# Patient Record
Sex: Female | Born: 1970 | Race: White | Hispanic: No | Smoking: Never smoker
Health system: Southern US, Community
[De-identification: ages and names within clinical notes are randomized; demographics above are authoritative.]

## PROBLEM LIST (undated history)

## (undated) DIAGNOSIS — B1920 Unspecified viral hepatitis C without hepatic coma: Secondary | ICD-10-CM

## (undated) DIAGNOSIS — D649 Anemia, unspecified: Secondary | ICD-10-CM

## (undated) DIAGNOSIS — J449 Chronic obstructive pulmonary disease, unspecified: Secondary | ICD-10-CM

## (undated) DIAGNOSIS — F1290 Cannabis use, unspecified, uncomplicated: Secondary | ICD-10-CM

## (undated) DIAGNOSIS — K219 Gastro-esophageal reflux disease without esophagitis: Secondary | ICD-10-CM

## (undated) DIAGNOSIS — A419 Sepsis, unspecified organism: Secondary | ICD-10-CM

## (undated) DIAGNOSIS — E785 Hyperlipidemia, unspecified: Secondary | ICD-10-CM

## (undated) DIAGNOSIS — Z87442 Personal history of urinary calculi: Secondary | ICD-10-CM

## (undated) DIAGNOSIS — R519 Headache, unspecified: Secondary | ICD-10-CM

## (undated) DIAGNOSIS — E039 Hypothyroidism, unspecified: Secondary | ICD-10-CM

## (undated) DIAGNOSIS — Z720 Tobacco use: Secondary | ICD-10-CM

## (undated) DIAGNOSIS — G8929 Other chronic pain: Secondary | ICD-10-CM

## (undated) DIAGNOSIS — I10 Essential (primary) hypertension: Secondary | ICD-10-CM

## (undated) DIAGNOSIS — N9489 Other specified conditions associated with female genital organs and menstrual cycle: Secondary | ICD-10-CM

## (undated) DIAGNOSIS — E669 Obesity, unspecified: Secondary | ICD-10-CM

## (undated) DIAGNOSIS — F419 Anxiety disorder, unspecified: Secondary | ICD-10-CM

## (undated) HISTORY — PX: LUNG SURGERY: SHX703

## (undated) HISTORY — DX: Hyperlipidemia, unspecified: E78.5

## (undated) HISTORY — PX: CLAVICLE SURGERY: SHX598

## (undated) HISTORY — DX: Obesity, unspecified: E66.9

## (undated) HISTORY — DX: Essential (primary) hypertension: I10

## (undated) HISTORY — PX: MANDIBLE SURGERY: SHX707

## (undated) HISTORY — DX: Hypothyroidism, unspecified: E03.9

## (undated) HISTORY — PX: TRACHEOSTOMY: SUR1362

## (undated) HISTORY — DX: Gastro-esophageal reflux disease without esophagitis: K21.9

## (undated) HISTORY — DX: Chronic obstructive pulmonary disease, unspecified: J44.9

## (undated) HISTORY — DX: Anxiety disorder, unspecified: F41.9

---
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
## 2000-08-04 ENCOUNTER — Emergency Department: Admit: 2000-08-04 | Payer: Self-pay | Source: Emergency Department

## 2004-01-17 ENCOUNTER — Emergency Department (HOSPITAL_COMMUNITY): Admission: EM | Admit: 2004-01-17 | Discharge: 2004-01-17 | Payer: Self-pay | Admitting: Emergency Medicine

## 2006-06-25 ENCOUNTER — Emergency Department (HOSPITAL_COMMUNITY): Admission: EM | Admit: 2006-06-25 | Discharge: 2006-06-25 | Payer: Self-pay | Admitting: Emergency Medicine

## 2007-01-08 ENCOUNTER — Emergency Department (HOSPITAL_COMMUNITY): Admission: EM | Admit: 2007-01-08 | Discharge: 2007-01-08 | Payer: Self-pay | Admitting: Emergency Medicine

## 2007-01-09 ENCOUNTER — Emergency Department (HOSPITAL_COMMUNITY): Admission: EM | Admit: 2007-01-09 | Discharge: 2007-01-09 | Payer: Self-pay | Admitting: Emergency Medicine

## 2007-11-11 ENCOUNTER — Emergency Department: Payer: Self-pay | Admitting: Internal Medicine

## 2009-05-29 ENCOUNTER — Emergency Department: Payer: Self-pay

## 2011-03-08 ENCOUNTER — Encounter (INDEPENDENT_AMBULATORY_CARE_PROVIDER_SITE_OTHER): Payer: Medicaid Other | Admitting: Obstetrics & Gynecology

## 2011-03-08 ENCOUNTER — Other Ambulatory Visit (HOSPITAL_COMMUNITY)
Admission: RE | Admit: 2011-03-08 | Discharge: 2011-03-08 | Disposition: A | Discharge status: Home / Self Care | Payer: Medicaid Other | Source: Ambulatory Visit | Attending: Obstetrics & Gynecology | Admitting: Obstetrics & Gynecology

## 2011-03-08 DIAGNOSIS — Z01419 Encounter for gynecological examination (general) (routine) without abnormal findings: Secondary | ICD-10-CM

## 2011-03-08 DIAGNOSIS — Z87891 Personal history of nicotine dependence: Secondary | ICD-10-CM

## 2011-03-08 DIAGNOSIS — N949 Unspecified condition associated with female genital organs and menstrual cycle: Secondary | ICD-10-CM
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
## 2011-03-16 ENCOUNTER — Other Ambulatory Visit: Payer: Self-pay | Admitting: Obstetrics & Gynecology

## 2011-03-16 DIAGNOSIS — Z1231 Encounter for screening mammogram for malignant neoplasm of breast: Secondary | ICD-10-CM

## 2011-03-16 DIAGNOSIS — R102 Pelvic and perineal pain: Secondary | ICD-10-CM

## 2011-03-22 ENCOUNTER — Ambulatory Visit: Payer: Self-pay | Admitting: Family Medicine

## 2011-03-23 ENCOUNTER — Ambulatory Visit (HOSPITAL_COMMUNITY): Payer: Medicaid Other

## 2011-03-25 ENCOUNTER — Ambulatory Visit (HOSPITAL_COMMUNITY): Payer: Medicaid Other

## 2011-03-28 ENCOUNTER — Other Ambulatory Visit: Payer: Self-pay | Admitting: Obstetrics & Gynecology

## 2011-03-28 ENCOUNTER — Ambulatory Visit (HOSPITAL_COMMUNITY)
Admission: RE | Admit: 2011-03-28 | Discharge: 2011-03-28 | Disposition: A | Discharge status: Home / Self Care | Payer: Medicaid Other | Source: Ambulatory Visit | Attending: Obstetrics & Gynecology | Admitting: Obstetrics & Gynecology

## 2011-03-28 DIAGNOSIS — N83209 Unspecified ovarian cyst, unspecified side: Secondary | ICD-10-CM | POA: Insufficient documentation

## 2011-03-28 DIAGNOSIS — N949 Unspecified condition associated with female genital organs and menstrual cycle: Secondary | ICD-10-CM | POA: Insufficient documentation

## 2011-03-28 DIAGNOSIS — R102 Pelvic and perineal pain: Secondary | ICD-10-CM

## 2013-08-23 ENCOUNTER — Other Ambulatory Visit (HOSPITAL_COMMUNITY)
Admission: RE | Admit: 2013-08-23 | Discharge: 2013-08-23 | Disposition: A | Discharge status: Home / Self Care | Payer: Medicaid Other | Source: Ambulatory Visit | Attending: Obstetrics & Gynecology | Admitting: Obstetrics & Gynecology

## 2013-08-23 ENCOUNTER — Encounter: Payer: Self-pay | Admitting: Obstetrics & Gynecology

## 2013-08-23 ENCOUNTER — Ambulatory Visit (INDEPENDENT_AMBULATORY_CARE_PROVIDER_SITE_OTHER): Payer: Medicaid Other | Admitting: Obstetrics & Gynecology

## 2013-08-23 VITALS — BP 82/64 | HR 76 | Ht 62.0 in | Wt 174.0 lb

## 2013-08-23 DIAGNOSIS — Z113 Encounter for screening for infections with a predominantly sexual mode of transmission: Secondary | ICD-10-CM | POA: Insufficient documentation

## 2013-08-23 DIAGNOSIS — Z01419 Encounter for gynecological examination (general) (routine) without abnormal findings: Secondary | ICD-10-CM

## 2013-08-23 DIAGNOSIS — Z23 Encounter for immunization: Secondary | ICD-10-CM

## 2013-08-23 DIAGNOSIS — Z0000 Encounter for general adult medical examination without abnormal findings: Secondary | ICD-10-CM

## 2013-08-23 DIAGNOSIS — Z1151 Encounter for screening for human papillomavirus (HPV): Secondary | ICD-10-CM | POA: Insufficient documentation

## 2013-08-23 NOTE — Patient Instructions (Signed)
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
## 2013-10-01 ENCOUNTER — Ambulatory Visit (HOSPITAL_COMMUNITY): Payer: Medicaid Other

## 2013-11-11 ENCOUNTER — Ambulatory Visit (HOSPITAL_COMMUNITY): Payer: Medicaid Other

## 2013-12-10 ENCOUNTER — Ambulatory Visit (HOSPITAL_COMMUNITY): Payer: Medicaid Other

## 2014-07-13 ENCOUNTER — Emergency Department
Admission: EM | Admit: 2014-07-13 | Discharge: 2014-07-13 | Disposition: A | Discharge status: Home / Self Care | Payer: No Typology Code available for payment source | Attending: Emergency Medicine | Admitting: Emergency Medicine

## 2014-07-13 ENCOUNTER — Emergency Department: Payer: No Typology Code available for payment source

## 2014-07-13 DIAGNOSIS — R17 Unspecified jaundice: Secondary | ICD-10-CM

## 2014-07-13 DIAGNOSIS — R112 Nausea with vomiting, unspecified: Secondary | ICD-10-CM | POA: Insufficient documentation

## 2014-07-13 DIAGNOSIS — E860 Dehydration: Secondary | ICD-10-CM | POA: Insufficient documentation

## 2014-07-13 LAB — CBC AND DIFFERENTIAL
Basophils Absolute Automated: 0.02 10*3/uL (ref 0.00–0.20)
Basophils Automated: 0 %
Eosinophils Absolute Automated: 0.02 10*3/uL (ref 0.00–0.70)
Eosinophils Automated: 0 %
Hematocrit: 41 % (ref 37.0–47.0)
Hgb: 13.8 g/dL (ref 12.0–16.0)
Immature Granulocytes Absolute: 0.02 10*3/uL
Immature Granulocytes: 0 %
Lymphocytes Absolute Automated: 1.36 10*3/uL (ref 0.50–4.40)
Lymphocytes Automated: 14 %
MCH: 32.4 pg — ABNORMAL HIGH (ref 28.0–32.0)
MCHC: 33.7 g/dL (ref 32.0–36.0)
MCV: 96.2 fL (ref 80.0–100.0)
MPV: 9.7 fL (ref 9.4–12.3)
Monocytes Absolute Automated: 0.48 10*3/uL (ref 0.00–1.20)
Monocytes: 5 %
Neutrophils Absolute: 8.06 10*3/uL (ref 1.80–8.10)
Neutrophils: 81 %
Platelets: 246 10*3/uL (ref 140–400)
RBC: 4.26 10*6/uL (ref 4.20–5.40)
RDW: 13 % (ref 12–15)
WBC: 9.94 10*3/uL (ref 3.50–10.80)

## 2014-07-13 LAB — COMPREHENSIVE METABOLIC PANEL
ALT: 13 U/L (ref 0–55)
AST (SGOT): 19 U/L (ref 5–34)
Albumin/Globulin Ratio: 1.6 (ref 0.9–2.2)
Albumin: 4.5 g/dL (ref 3.5–5.0)
Alkaline Phosphatase: 56 U/L (ref 37–106)
Anion Gap: 13 (ref 5.0–15.0)
BUN: 9 mg/dL (ref 7.0–19.0)
Bilirubin, Total: 1.3 mg/dL — ABNORMAL HIGH (ref 0.2–1.2)
CO2: 21 mEq/L — ABNORMAL LOW (ref 22–29)
Calcium: 9.5 mg/dL (ref 8.5–10.5)
Chloride: 109 mEq/L (ref 100–111)
Creatinine: 0.8 mg/dL (ref 0.6–1.0)
Globulin: 2.9 g/dL (ref 2.0–3.6)
Glucose: 98 mg/dL (ref 70–100)
Potassium: 4.3 mEq/L (ref 3.5–5.1)
Protein, Total: 7.4 g/dL (ref 6.0–8.3)
Sodium: 143 mEq/L (ref 136–145)

## 2014-07-13 LAB — URINALYSIS
Bilirubin, UA: NEGATIVE
Blood, UA: NEGATIVE
Glucose, UA: NEGATIVE
Ketones UA: 15 — AB
Leukocyte Esterase, UA: NEGATIVE
Nitrite, UA: NEGATIVE
Protein, UR: NEGATIVE
Specific Gravity UA: 1.023 (ref 1.001–1.035)
Urine pH: 8.5 — AB (ref 5.0–8.0)
Urobilinogen, UA: 0.2 mg/dL (ref 0.2–2.0)

## 2014-07-13 LAB — GFR: EGFR: >60

## 2014-07-13 LAB — LIPASE: Lipase: 16 U/L (ref 8–78)

## 2014-07-13 MED ORDER — ONDANSETRON 4 MG PO TBDP
4.0000 mg | ORAL_TABLET | Freq: Four times a day (QID) | ORAL | Status: DC | PRN
Start: 2014-07-13 — End: 2014-09-27

## 2014-07-13 MED ORDER — ONDANSETRON HCL 4 MG/2ML IJ SOLN
4.0000 mg | Freq: Once | INTRAMUSCULAR | Status: AC
Start: 2014-07-13 — End: 2014-07-13
  Administered 2014-07-13: 4 mg via INTRAVENOUS
  Filled 2014-07-13: qty 2

## 2014-07-13 MED ORDER — SODIUM CHLORIDE 0.9 % IV BOLUS
1000.0000 mL | Freq: Once | INTRAVENOUS | Status: AC
Start: 2014-07-13 — End: 2014-07-13
  Administered 2014-07-13: 1000 mL via INTRAVENOUS

## 2014-07-13 NOTE — ED Notes (Signed)
Discharge instructions reviewed. Pt verbalized understanding. Pt discharged to home with family

## 2014-07-13 NOTE — ED Provider Notes (Signed)
Physician/Midlevel provider first contact with patient: 07/13/14 1225         History     Chief Complaint   Patient presents with   . Nausea   . Emesis     HPI Comments: 43 year old female with complaints of nausea and vomiting since this morning.  States she drank approximately 4 shots of alcohol and a mixed drink last night.   States she has not been able to keep any food or liquids down since last night.  Denies abdominal pain or fever.    Patient is a 43 y.o. female presenting with vomiting. The history is provided by the patient.   Emesis  Severity:  Moderate  Timing:  Constant  Feeding tolerance: Nothing.  Progression:  Unchanged  Chronicity:  New  Relieved by:  None tried  Worsened by:  Liquids  Associated symptoms: no abdominal pain and no chills    Risk factors: alcohol use             History reviewed. No pertinent past medical history.    Past Surgical History   Procedure Laterality Date   . Cesarean section         No family history on file.    Social  History   Substance Use Topics   . Smoking status: Never Smoker    . Smokeless tobacco: Not on file   . Alcohol Use: 4.2 oz/week     7 Glasses of wine per week       .     No Known Allergies    Discharge Medication List as of 07/13/2014  2:19 PM           Review of Systems   Constitutional: Negative for fever and chills.   HENT: Negative for congestion.    Eyes: Negative for discharge and redness.   Respiratory: Negative for cough and shortness of breath.    Cardiovascular: Negative for chest pain.   Gastrointestinal: Positive for nausea and vomiting. Negative for abdominal pain and abdominal distention.   Genitourinary: Negative for dysuria and flank pain.   Musculoskeletal: Negative for back pain.   Skin: Negative for color change and wound.   Neurological: Negative for dizziness and light-headedness.       Physical Exam    BP: 138/62 mmHg, Heart Rate: 68, Temp: 97.7 F (36.5 C), Resp Rate: 18, SpO2: 100 %, Weight: 63.504 kg    Physical Exam    Constitutional: She is oriented to person, place, and time. She appears well-developed and well-nourished.   HENT:   Head: Normocephalic and atraumatic.   Eyes: Conjunctivae are normal. Right eye exhibits no discharge. Left eye exhibits no discharge.   Neck: Normal range of motion. Neck supple.   Cardiovascular: Normal rate and regular rhythm.    Pulmonary/Chest: Effort normal and breath sounds normal. No respiratory distress.   Abdominal: Soft. Bowel sounds are normal. She exhibits no distension. There is no tenderness.   Musculoskeletal: Normal range of motion.   Neurological: She is alert and oriented to person, place, and time.   Skin: Skin is warm and dry.   Psychiatric: She has a normal mood and affect. Her behavior is normal.   Nursing note and vitals reviewed.      MDM and ED Course     ED Medication Orders     Start     Status Ordering Provider    07/13/14 1232  ondansetron (ZOFRAN) injection 4 mg   Once  Route: Intravenous  Ordered Dose: 4 mg     Last MAR action:  Given Zaidee Rion JEAN    07/13/14 1227  sodium chloride 0.9 % bolus 1,000 mL   Once     Route: Intravenous  Ordered Dose: 1,000 mL     Last MAR action:  New Bag Yuriel Lopezmartinez JEAN              MDM  Number of Diagnoses or Management Options  Dehydration:   Elevated bilirubin:   Non-intractable vomiting with nausea, vomiting of unspecified type:   Diagnosis management comments: I, Polo Riley PA-C, have been the primary provider for Ralene Cork during this Emergency Dept visit.  Oxygen saturation by pulse oximetry is 95%-100%, Normal.  Interventions: None Needed    Feeling better after fluids.  Able to tolerate by mouth in the ED without difficulty.Recommended immediate er return for any new or worsening problems or concerns. Pt expressed understanding and agreement with [place] plan. Well appearing upon [place].     The attending signature signifies review and agreement of the history, physical examination, evaluation, clinical impression and plan except  as noted  Case d/w Dr.Bernier who is in agreement with plan       Amount and/or Complexity of Data Reviewed  Clinical lab tests: ordered and reviewed           Procedures    Clinical Impression & Disposition     Clinical Impression  Final diagnoses:   Non-intractable vomiting with nausea, vomiting of unspecified type   Elevated bilirubin   Dehydration        ED Disposition     Discharge Ralene Cork discharge to home/self care.    Condition at disposition: Stable    Discharge instructions reviewed. Pt verbalized understanding. VSS             Discharge Medication List as of 07/13/2014  2:19 PM      START taking these medications    Details   ondansetron (ZOFRAN ODT) 4 MG disintegrating tablet Take 1 tablet (4 mg total) by mouth every 6 (six) hours as needed for Nausea., Starting 07/13/2014, Until Discontinued, Print                       Rondell Reams, [place]  07/13/14 1802    Toni Arthurs, DO  07/14/14 0730

## 2014-07-13 NOTE — ED Notes (Signed)
Patient reports drinking 1 mixed drink and 4 shots last night. Last drink at 10pm. Unable to tolerate po today.

## 2014-07-13 NOTE — Discharge Instructions (Signed)
Return to the ER for any new or worsening symptoms or concerns    Vomiting    You have been seen for vomiting.    Vomiting (throwing-up) can be caused by many different things. Most of the time the cause IS NOT serious. The doctor feels it is OK for you to go home today.    Common causes of vomiting include the following:   Gastroenteritis (stomach flu), usually with diarrhea.   Other illnesses. Sometimes medical conditions like diabetes, heart problems, headaches, or infections can make someone throw up.    Bowel obstructions (blockages) can cause vomiting and make patients unable to have bowel movements (stool) or pass gas.   Vomiting can be a symptom of appendicitis, especially if there is also pain in the right lower abdomen (belly).    Sometimes it is hard to find out what is causing the vomiting. Vomiting can be treated with anti-nausea medicines like promethazine (Phenergan), prochlorperazine (Compazine) or ondansetron (Zofran).    Try to drink liquids to avoid dehydration. Don't drink a lot of fluid all at once. Take small sips throughout the day.    YOU SHOULD SEEK MEDICAL ATTENTION IMMEDIATELY, EITHER HERE OR AT THE NEAREST EMERGENCY DEPARTMENT, IF ANY OF THE FOLLOWING OCCURS:   You can't stop vomiting or your vomiting doesn't get better with medication.   You cannot keep liquids down.   You have severe sudden chest or belly pain after vomiting.   You have abdominal pain.       Dehydration    You have been diagnosed with dehydration.    Dehydration is when your body is low on fluids (liquids). Dehydration has a variety of causes. These range from vomiting and diarrhea, to excessive (a lot of) sweating and poor appetite.    You have received intravenous (IV) fluids. These are to help fix your dehydration. It is important to keep hydrating yourself at home. Drink plenty of fluids frequently. Drink fluids that won t upset your stomach. This includes water and juice. It also includes drinks  like Gatorade. Stay away from beverages like soda pop and tea. They may make you worse. Avoid caffeine and alcohol. They may cause you to lose more fluid.    YOU SHOULD SEEK MEDICAL ATTENTION IMMEDIATELY, EITHER HERE OR AT THE NEAREST EMERGENCY DEPARTMENT, IF ANY OF THE FOLLOWING OCCURS:   Fever (temperature higher than 100.63F / 38C) or shaking chills.   Constant vomiting and/or diarrhea.   Lightheadedness or fainting.   If you do not urinate (pee) for 8 or more hours.            Abnormal Laboratory Result (elevated bilirubin)    During your visit today, one of your tests was abnormal.    Abnormal laboratory results are common. Most of the time an incidental abnormal laboratory result is nothing serious. Your doctor today doesn t think anything needs to be done about your laboratory result right away. You might need another exam or more tests to find out why you have this abnormal lab result. At this time, the cause of laboratory results does not seem dangerous. You do not need to stay in the hospital.    We don't believe your condition is dangerous right now. However, you need to be careful. Sometimes a problem that seems small can get serious later. This is why it is very important to come back here or go to the nearest Emergency Department unless you are 100% improved.    In rare  cases an abnormal laboratory result can be a sign of something serious or that you are developing an illness. This is why it s important for your primary doctor to keep a close eye on you. They will need to recheck your abnormal laboratory result.    Come back here or go to the nearest emergency department or follow-up with your doctor in:    7 days.     YOU SHOULD SEEK MEDICAL ATTENTION IMMEDIATELY, EITHER HERE OR AT THE NEAREST EMERGENCY DEPARTMENT, IF ANY OF THE FOLLOWING OCCURS:     You can t get your laboratory result rechecked in the time period your doctor recommended today.   You have a fever (temperature higher  than 100.43F or 38C).    You get any other symptoms, concerns, or don t get better as expected.   You get worse or feel you can t wait until your follow-up appointment to get treated.    If you can t follow up with your doctor, or if at any time you feel you need to be rechecked or seen again, come back here or go to the nearest emergency department.

## 2014-08-18 ENCOUNTER — Encounter: Payer: Self-pay | Admitting: Obstetrics & Gynecology

## 2014-09-27 ENCOUNTER — Emergency Department: Payer: No Typology Code available for payment source

## 2014-09-27 ENCOUNTER — Emergency Department
Admission: EM | Admit: 2014-09-27 | Discharge: 2014-09-27 | Disposition: A | Discharge status: Home / Self Care | Payer: No Typology Code available for payment source | Attending: Emergency Medicine | Admitting: Emergency Medicine

## 2014-09-27 DIAGNOSIS — Y9301 Activity, walking, marching and hiking: Secondary | ICD-10-CM | POA: Insufficient documentation

## 2014-09-27 DIAGNOSIS — S2241XA Multiple fractures of ribs, right side, initial encounter for closed fracture: Secondary | ICD-10-CM | POA: Insufficient documentation

## 2014-09-27 DIAGNOSIS — M4185 Other forms of scoliosis, thoracolumbar region: Secondary | ICD-10-CM | POA: Insufficient documentation

## 2014-09-27 DIAGNOSIS — W109XXA Fall (on) (from) unspecified stairs and steps, initial encounter: Secondary | ICD-10-CM | POA: Insufficient documentation

## 2014-09-27 MED ORDER — ONDANSETRON 4 MG PO TBDP
4.0000 mg | ORAL_TABLET | Freq: Three times a day (TID) | ORAL | Status: AC | PRN
Start: 2014-09-27 — End: ?

## 2014-09-27 MED ORDER — ONDANSETRON HCL 4 MG/2ML IJ SOLN
4.0000 mg | Freq: Once | INTRAMUSCULAR | Status: AC
Start: 2014-09-27 — End: 2014-09-27
  Administered 2014-09-27: 4 mg via INTRAVENOUS
  Filled 2014-09-27: qty 2

## 2014-09-27 MED ORDER — PROMETHAZINE HCL 25 MG/ML IJ SOLN
12.5000 mg | Freq: Once | INTRAMUSCULAR | Status: AC
Start: 2014-09-27 — End: 2014-09-27
  Administered 2014-09-27: 12.5 mg via INTRAVENOUS
  Filled 2014-09-27: qty 1

## 2014-09-27 MED ORDER — HYDROMORPHONE HCL 1 MG/ML IJ SOLN
1.0000 mg | Freq: Once | INTRAMUSCULAR | Status: AC
Start: 2014-09-27 — End: 2014-09-27
  Administered 2014-09-27: 1 mg via INTRAVENOUS
  Filled 2014-09-27: qty 1

## 2014-09-27 MED ORDER — HYDROMORPHONE HCL 2 MG PO TABS
2.0000 mg | ORAL_TABLET | Freq: Four times a day (QID) | ORAL | Status: DC | PRN
Start: 2014-09-27 — End: 2018-02-01

## 2014-09-27 NOTE — ED Provider Notes (Signed)
Physician/Midlevel provider first contact with patient: 09/27/14 2045         History     Chief Complaint   Patient presents with   . Fall     Patient presents after fall on stairs.  She complains of pain to her upper back and right posterior chest wall.  She admits to wine with dinner earlier tonight.  She states she was walking down carpeted steps when she slipped and fell onto her right side.  She denies head injury or loss of consciousness.  She denies neck pain, abdominal pain, focal weakness, sensory changes, vision changes or pain in the extremities.  Pain is exacerbated with movement and relieved with rest.  She is transported to the emergency department by EMS for evaluation.    History reviewed. No pertinent past medical history.    Past Surgical History   Procedure Laterality Date   . Cesarean section         History reviewed. No pertinent family history.    Social  History   Substance Use Topics   . Smoking status: Never Smoker    . Smokeless tobacco: Not on file   . Alcohol Use: 4.2 oz/week     7 Glasses of wine per week       .     No Known Allergies    Current/Home Medications    No medications on file        Review of Systems   Constitutional: Negative for fever and chills.   Respiratory: Negative for cough and shortness of breath.    Cardiovascular: Negative for chest pain, palpitations and leg swelling.   Gastrointestinal: Negative for nausea, vomiting, abdominal pain and diarrhea.   Genitourinary: Negative for dysuria, urgency, hematuria and flank pain.   Musculoskeletal: Positive for back pain. Negative for myalgias, arthralgias and neck pain.        Right posterior Chest wall pain   Skin: Negative for color change and rash.   Neurological: Negative for dizziness and headaches.   Psychiatric/Behavioral: Negative for confusion and agitation.   All other systems reviewed and are negative.      Physical Exam    BP: 111/78 mmHg, Heart Rate: 71, Temp: 98.5 F (36.9 C), Resp Rate: 17, SpO2: 99 %,  Weight: 63.504 kg     Physical Exam   Constitutional: She is oriented to person, place, and time. She appears well-developed and well-nourished. No distress.   HENT:   Head: Normocephalic and atraumatic.   Eyes: EOM are normal. Pupils are equal, round, and reactive to light.   Neck: No JVD present.   Distal posterior neck tenderness.  C-collar placed in emergency department.   Cardiovascular: Normal rate, regular rhythm, normal heart sounds and intact distal pulses.  Exam reveals no gallop and no friction rub.    No murmur heard.  Pulmonary/Chest: Effort normal and breath sounds normal. She exhibits tenderness (Right posterior lateral chest wall tenderness.  No skin changes, no deformity, no step-off).   Abdominal: Soft. Bowel sounds are normal. There is no tenderness.   Musculoskeletal: She exhibits no edema.        Cervical back: She exhibits tenderness. She exhibits no swelling, no edema and no deformity.        Thoracic back: She exhibits tenderness. She exhibits no swelling, no edema and no deformity.        Lumbar back: Normal.   Neurological: She is alert and oriented to person, place, and time. She has  normal strength. No cranial nerve deficit or sensory deficit. She exhibits normal muscle tone. She displays a negative Romberg sign. Coordination normal. GCS eye subscore is 4. GCS verbal subscore is 5. GCS motor subscore is 6.   Skin: Skin is warm and dry. No rash noted.   Psychiatric: She has a normal mood and affect. Her behavior is normal. Thought content normal.   Nursing note and vitals reviewed.        MDM and ED Course     ED Medication Orders     Start     Status Ordering Provider    09/27/14 2152  promethazine (PHENERGAN) injection 12.5 mg   Once     Route: Intravenous  Ordered Dose: 12.5 mg     Last MAR action:  Given Avinash Maltos C    09/27/14 2108  HYDROmorphone (DILAUDID) injection 1 mg   Once     Route: Intravenous  Ordered Dose: 1 mg     Last MAR action:  Given Gredmarie Delange C    09/27/14 2108   ondansetron (ZOFRAN) injection 4 mg   Once     Route: Intravenous  Ordered Dose: 4 mg     Last MAR action:  Given Avangelina Flight C              MDM  Number of Diagnoses or Management Options  Closed fracture of multiple ribs of right side, initial encounter:   Diagnosis management comments: I, Pranshu Lyster C. Kizzie Bane, MD, have been the primary provider for Adrienne Harmon during this Emergency Dept visit.    Oxygen saturation by pulse oximetry is 95%-100%, Normal.  Interventions: None Needed    10:43 PM   Minimally displaced rib fractures of the right 9th, 10th and 11th ribs.  No pneumothorax, no evidence of other significant injury.  CT cervical spine is negative.  X-ray and thoracic spine is negative.  Symptomatic management with pain medicine, nausea medicine and incentive spirometry.  Patient is stable for outpatient follow-up.         Amount and/or Complexity of Data Reviewed  Tests in the radiology section of CPT: ordered and reviewed           Procedures    Clinical Impression & Disposition     Clinical Impression  Final diagnoses:   Closed fracture of multiple ribs of right side, initial encounter        ED Disposition     Discharge Adrienne Harmon discharge to home/self care.    Condition at disposition: Stable             New Prescriptions    HYDROMORPHONE (DILAUDID) 2 MG TABLET    Take 1 tablet (2 mg total) by mouth every 6 (six) hours as needed for Pain (As needed for pain).    ONDANSETRON (ZOFRAN ODT) 4 MG DISINTEGRATING TABLET    Take 1 tablet (4 mg total) by mouth every 8 (eight) hours as needed for Nausea.                 Erling Conte, MD  09/28/14 [phone]

## 2014-09-27 NOTE — ED Notes (Signed)
Pt provided information on incentive spirometer.  Verbalized understanding. Denied any further questions.

## 2014-09-27 NOTE — ED Notes (Signed)
Patient states she fell down 3-4 steps tonight and landed directly on right side of back. Patient complains of back pain. Patient denies hitting head and LOC.

## 2014-09-27 NOTE — Discharge Instructions (Signed)
Rib Fracture    You have been diagnosed with a rib fracture ("broken rib").    Fracture means "broken bone." Rib fractures are broken ribs. When there is a rib fracture, muscles between the ribs are also likely bruised. This is called a chest wall contusion. Both conditions are painful. This is because every breath moves the injured area. The conditions are not dangerous on their own. Sometimes complications happen. These include pneumonia or a collapsed lung. Rib fractures take 4 to 8 weeks to heal. Your pain should go down over this time.    Do not bind or tape your ribs. Binding or taping them may help with the pain. However, it makes the risk of pneumonia worse.    Cough and deep breathe at least 10 times an hour while awake. Do this even if it is painful. Support the area with a pillow or your hand. This will help with the pain. Use pain medicine as prescribed. This will help with the pain. You will be able to breathe normally. This will help you do the coughing and deep-breathing exercises. These exercises can help prevent pneumonia.    YOU SHOULD SEEK MEDICAL ATTENTION IMMEDIATELY, EITHER HERE OR AT THE NEAREST EMERGENCY DEPARTMENT, IF ANY OF THE FOLLOWING OCCURS:   Shortness of breath (wheezing or trouble breathing).   Coughing up green or yellow material.   Fever (temperature higher than 100.4F / 38C) or any fever that doesn't go away.   Severe chest pain or pain that suddenly gets worse.   No improvement in the next few days.

## 2015-01-02 ENCOUNTER — Other Ambulatory Visit (HOSPITAL_COMMUNITY): Payer: Self-pay | Admitting: Family Medicine

## 2015-01-02 DIAGNOSIS — Z1231 Encounter for screening mammogram for malignant neoplasm of breast: Secondary | ICD-10-CM

## 2015-01-09 ENCOUNTER — Ambulatory Visit (HOSPITAL_COMMUNITY): Payer: Medicaid Other

## 2015-01-13 ENCOUNTER — Ambulatory Visit (HOSPITAL_COMMUNITY)
Admission: RE | Admit: 2015-01-13 | Discharge: 2015-01-13 | Disposition: A | Discharge status: Home / Self Care | Payer: Medicaid Other | Source: Ambulatory Visit | Attending: Family Medicine | Admitting: Family Medicine

## 2015-01-13 DIAGNOSIS — Z1231 Encounter for screening mammogram for malignant neoplasm of breast: Secondary | ICD-10-CM | POA: Diagnosis not present

## 2015-01-14 ENCOUNTER — Other Ambulatory Visit: Payer: Self-pay | Admitting: Family Medicine

## 2015-01-14 DIAGNOSIS — R928 Other abnormal and inconclusive findings on diagnostic imaging of breast: Secondary | ICD-10-CM

## 2015-01-21 ENCOUNTER — Ambulatory Visit
Admission: RE | Admit: 2015-01-21 | Discharge: 2015-01-21 | Disposition: A | Discharge status: Home / Self Care | Payer: Medicaid Other | Source: Ambulatory Visit | Attending: Family Medicine | Admitting: Family Medicine

## 2015-01-21 ENCOUNTER — Other Ambulatory Visit: Payer: Self-pay | Admitting: Family Medicine

## 2015-01-21 DIAGNOSIS — N6320 Unspecified lump in the left breast, unspecified quadrant: Secondary | ICD-10-CM

## 2015-01-21 DIAGNOSIS — R928 Other abnormal and inconclusive findings on diagnostic imaging of breast: Secondary | ICD-10-CM

## 2015-01-22 ENCOUNTER — Other Ambulatory Visit (HOSPITAL_COMMUNITY): Payer: Self-pay | Admitting: Diagnostic Radiology

## 2015-01-22 ENCOUNTER — Ambulatory Visit
Admission: RE | Admit: 2015-01-22 | Discharge: 2015-01-22 | Disposition: A | Discharge status: Home / Self Care | Payer: Medicaid Other | Source: Ambulatory Visit | Attending: Family Medicine | Admitting: Family Medicine

## 2015-01-22 ENCOUNTER — Other Ambulatory Visit: Payer: Self-pay | Admitting: Family Medicine

## 2015-01-22 DIAGNOSIS — N6320 Unspecified lump in the left breast, unspecified quadrant: Secondary | ICD-10-CM

## 2015-01-23 HISTORY — PX: BREAST BIOPSY: SHX20

## 2015-01-26 ENCOUNTER — Other Ambulatory Visit: Payer: Medicaid Other

## 2016-07-08 ENCOUNTER — Inpatient Hospital Stay
Admission: EM | Admit: 2016-07-08 | Discharge: 2016-07-11 | DRG: 871 | Disposition: A | Discharge status: Home / Self Care | Payer: Self-pay | Attending: Internal Medicine | Admitting: Internal Medicine

## 2016-07-08 ENCOUNTER — Emergency Department: Payer: Self-pay

## 2016-07-08 ENCOUNTER — Encounter: Payer: Self-pay | Admitting: Urgent Care

## 2016-07-08 DIAGNOSIS — J189 Pneumonia, unspecified organism: Secondary | ICD-10-CM | POA: Diagnosis present

## 2016-07-08 DIAGNOSIS — E039 Hypothyroidism, unspecified: Secondary | ICD-10-CM | POA: Diagnosis present

## 2016-07-08 DIAGNOSIS — F17200 Nicotine dependence, unspecified, uncomplicated: Secondary | ICD-10-CM | POA: Diagnosis present

## 2016-07-08 DIAGNOSIS — J440 Chronic obstructive pulmonary disease with acute lower respiratory infection: Secondary | ICD-10-CM | POA: Diagnosis present

## 2016-07-08 DIAGNOSIS — R197 Diarrhea, unspecified: Secondary | ICD-10-CM | POA: Diagnosis present

## 2016-07-08 DIAGNOSIS — Z886 Allergy status to analgesic agent status: Secondary | ICD-10-CM

## 2016-07-08 DIAGNOSIS — R091 Pleurisy: Secondary | ICD-10-CM | POA: Diagnosis present

## 2016-07-08 DIAGNOSIS — Z880 Allergy status to penicillin: Secondary | ICD-10-CM

## 2016-07-08 DIAGNOSIS — Z6830 Body mass index (BMI) 30.0-30.9, adult: Secondary | ICD-10-CM

## 2016-07-08 DIAGNOSIS — F419 Anxiety disorder, unspecified: Secondary | ICD-10-CM | POA: Diagnosis present

## 2016-07-08 DIAGNOSIS — A419 Sepsis, unspecified organism: Principal | ICD-10-CM | POA: Diagnosis present

## 2016-07-08 DIAGNOSIS — I10 Essential (primary) hypertension: Secondary | ICD-10-CM | POA: Diagnosis present

## 2016-07-08 DIAGNOSIS — K219 Gastro-esophageal reflux disease without esophagitis: Secondary | ICD-10-CM | POA: Diagnosis present

## 2016-07-08 DIAGNOSIS — E669 Obesity, unspecified: Secondary | ICD-10-CM | POA: Diagnosis present

## 2016-07-08 DIAGNOSIS — E785 Hyperlipidemia, unspecified: Secondary | ICD-10-CM | POA: Diagnosis present

## 2016-07-08 DIAGNOSIS — E876 Hypokalemia: Secondary | ICD-10-CM | POA: Diagnosis present

## 2016-07-08 LAB — URINALYSIS COMPLETE WITH MICROSCOPIC (ARMC ONLY)
Bilirubin Urine: NEGATIVE
Glucose, UA: NEGATIVE mg/dL
Nitrite: NEGATIVE
PH: 6 (ref 5.0–8.0)
Protein, ur: 100 mg/dL — AB
Specific Gravity, Urine: 1.015 (ref 1.005–1.030)

## 2016-07-08 LAB — COMPREHENSIVE METABOLIC PANEL
ALBUMIN: 3.3 g/dL — AB (ref 3.5–5.0)
ALK PHOS: 165 U/L — AB (ref 38–126)
ALT: 36 U/L (ref 14–54)
ANION GAP: 9 (ref 5–15)
AST: 39 U/L (ref 15–41)
BILIRUBIN TOTAL: 1.1 mg/dL (ref 0.3–1.2)
BUN: 10 mg/dL (ref 6–20)
CALCIUM: 8.9 mg/dL (ref 8.9–10.3)
CO2: 22 mmol/L (ref 22–32)
Chloride: 101 mmol/L (ref 101–111)
Creatinine, Ser: 0.99 mg/dL (ref 0.44–1.00)
GFR calc Af Amer: >60 mL/min (ref >60)
GFR calc non Af Amer: >60 mL/min (ref >60)
GLUCOSE: 118 mg/dL — AB (ref 65–99)
POTASSIUM: 2.9 mmol/L — AB (ref 3.5–5.1)
Sodium: 132 mmol/L — ABNORMAL LOW (ref 135–145)
Total Protein: 8.1 g/dL (ref 6.5–8.1)

## 2016-07-08 LAB — CBC
HEMATOCRIT: 41.7 % (ref 35.0–47.0)
HEMOGLOBIN: 14.1 g/dL (ref 12.0–16.0)
MCH: 31.1 pg (ref 26.0–34.0)
MCHC: 33.8 g/dL (ref 32.0–36.0)
MCV: 91.8 fL (ref 80.0–100.0)
Platelets: 299 10*3/uL (ref 150–440)
RBC: 4.54 MIL/uL (ref 3.80–5.20)
RDW: 15.1 % — ABNORMAL HIGH (ref 11.5–14.5)
WBC: 24.7 10*3/uL — ABNORMAL HIGH (ref 3.6–11.0)

## 2016-07-08 LAB — POCT PREGNANCY, URINE: PREG TEST UR: NEGATIVE

## 2016-07-08 LAB — LACTIC ACID, PLASMA: Lactic Acid, Venous: 1 mmol/L (ref 0.5–1.9)

## 2016-07-08 MED ORDER — ONDANSETRON HCL 4 MG/2ML IJ SOLN
4.0000 mg | Freq: Once | INTRAMUSCULAR | Status: AC
  Administered 2016-07-08: 4 mg via INTRAVENOUS
  Filled 2016-07-08: qty 2

## 2016-07-08 MED ORDER — SODIUM CHLORIDE 0.9 % IV BOLUS (SEPSIS)
1000.0000 mL | Freq: Once | INTRAVENOUS | Status: AC
  Administered 2016-07-08: 1000 mL via INTRAVENOUS

## 2016-07-08 MED ORDER — DEXTROSE 5 % IV SOLN
500.0000 mg | Freq: Once | INTRAVENOUS | Status: AC
  Administered 2016-07-08: 500 mg via INTRAVENOUS
  Filled 2016-07-08: qty 500

## 2016-07-08 MED ORDER — DEXTROSE 5 % IV SOLN
1.0000 g | Freq: Once | INTRAVENOUS | Status: AC
  Administered 2016-07-08: 1 g via INTRAVENOUS
  Filled 2016-07-08: qty 10

## 2016-07-08 MED ORDER — MORPHINE SULFATE (PF) 4 MG/ML IV SOLN
4.0000 mg | Freq: Once | INTRAVENOUS | Status: AC
  Administered 2016-07-08: 4 mg via INTRAVENOUS
  Filled 2016-07-08: qty 1

## 2016-07-08 MED ORDER — SODIUM CHLORIDE 0.9 % IV BOLUS (SEPSIS)
250.0000 mL | Freq: Once | INTRAVENOUS | Status: AC
  Administered 2016-07-08: 250 mL via INTRAVENOUS

## 2016-07-08 NOTE — ED Provider Notes (Signed)
[facility] Emergency Department Provider Note   ____________________________________________   First MD Initiated Contact with Patient 07/08/16 2143     (approximate)  I have reviewed the triage vital signs and the nursing notes.   HISTORY  Chief Complaint Influenza; Facial Pain; Otalgia; Nausea; and Emesis   HPI Dawn Kane is a 45 y.o. female with a history of COPD, hypertension and hypothyroidism who is presenting to the emergency department today with 1 week of worsening fever, cough as well as facial and ear pain. She says that she still smokes. Denies being admitted to the hospital since she gave birth many years ago. No known sick contacts.   Past Medical History:  Diagnosis Date  . Anxiety   . COPD (chronic obstructive pulmonary disease) (HCC)   . GERD (gastroesophageal reflux disease)   . Hyperlipidemia   . Hypertension   . Hypothyroidism   . Obesity     There are no active problems to display for this patient.   Past Surgical History:  Procedure Laterality Date  . CLAVICLE SURGERY    . LUNG SURGERY    . MANDIBLE SURGERY      Prior to Admission medications   Medication Sig Start Date End Date Taking? Authorizing Provider  albuterol (PROVENTIL HFA;VENTOLIN HFA) 108 (90 BASE) MCG/ACT inhaler Inhale 2 puffs into the lungs every 6 (six) hours as needed for wheezing or shortness of breath.    Historical Provider, MD  ALPRAZolam Prudy Feeler) 0.5 MG tablet Take 0.5 mg by mouth at bedtime as needed for anxiety.    Historical Provider, MD  hydrochlorothiazide (HYDRODIURIL) 25 MG tablet Take 25 mg by mouth daily.    Historical Provider, MD  levothyroxine (SYNTHROID, LEVOTHROID) 25 MCG tablet Take 25 mcg by mouth daily before breakfast.    Historical Provider, MD  methylPREDNISolone (MEDROL) 4 MG tablet Take 4 mg by mouth daily.    Historical Provider, MD  omeprazole (PRILOSEC) 20 MG capsule Take 20 mg by mouth daily.    Historical Provider,  MD  simvastatin (ZOCOR) 20 MG tablet Take 20 mg by mouth daily.    Historical Provider, MD  traMADol (ULTRAM) 50 MG tablet Take by mouth every 6 (six) hours as needed.    Historical Provider, MD    Allergies Amoxicillin and Aspirin  Family History  Problem Relation Age of Onset  . Heart disease Paternal Grandmother   . Cancer Paternal Grandfather     pancreatic    Social History Social History  Substance Use Topics  . Smoking status: Current Every Day Smoker  . Smokeless tobacco: Never Used  . Alcohol use Yes     Comment: social    Review of Systems Constitutional: fever  Eyes: No visual changes. ENT: No sore throat. Cardiovascular: Denies chest pain. Respiratory: cough Gastrointestinal: No abdominal pain.  No nausea, no vomiting.  No diarrhea.  No constipation. Genitourinary: Negative for dysuria. Musculoskeletal: Negative for back pain. Skin: Negative for rash. Neurological: Negative for headaches, focal weakness or numbness.  10-point ROS otherwise negative.  ____________________________________________   PHYSICAL EXAM:  VITAL SIGNS: ED Triage Vitals  Enc Vitals Group     BP 07/08/16 2000 (!) 155/97     Pulse Rate 07/08/16 2000 (!) 111     Resp 07/08/16 2000 (!) 22     Temp 07/08/16 2000 99.2 F (37.3 C)     Temp Source 07/08/16 2000 Oral     SpO2 07/08/16 2000 92 %  Weight 07/08/16 2001 160 lb (72.6 kg)     Height 07/08/16 2001 5' 2.5" (1.588 m)     Head Circumference --      Peak Flow --      Pain Score 07/08/16 2001 5     Pain Loc --      Pain Edu? --      Excl. in GC? --     Constitutional: Alert and oriented. Well appearing and in no acute distress. Eyes: Conjunctivae are normal. PERRL. EOMI. Head: Atraumatic.  Tenderness palpation over the frontal and maxillary sinuses. Normal TMs bilaterally. Nose: Mild to moderate clear rhinorrhea bilaterally. Mouth/Throat: Mucous membranes are moist.   Neck: No stridor.   Cardiovascular: Cardiac,  regular rhythm. Grossly normal heart sounds.  Good peripheral circulation. Respiratory: Normal respiratory effort.  No retractions. Rales to the right upper field. Gastrointestinal: Soft and nontender. No distention. No abdominal bruits. No CVA tenderness. Musculoskeletal: No lower extremity tenderness nor edema.  No joint effusions. Neurologic:  Normal speech and language. No gross focal neurologic deficits are appreciated.  Skin:  Skin is warm, dry and intact. No rash noted. Psychiatric: Mood and affect are normal. Speech and behavior are normal.  ____________________________________________   LABS (all labs ordered are listed, but only abnormal results are displayed)  Labs Reviewed  COMPREHENSIVE METABOLIC PANEL - Abnormal; Notable for the following:       Result Value   Sodium 132 (*)    Potassium 2.9 (*)    Glucose, Bld 118 (*)    Albumin 3.3 (*)    Alkaline Phosphatase 165 (*)    All other components within normal limits  CBC - Abnormal; Notable for the following:    WBC 24.7 (*)    RDW 15.1 (*)    All other components within normal limits  URINALYSIS COMPLETEWITH MICROSCOPIC ([facility] ONLY) - Abnormal; Notable for the following:    Color, Urine AMBER (*)    APPearance CLOUDY (*)    Ketones, ur TRACE (*)    Hgb urine dipstick 1+ (*)    Protein, ur 100 (*)    Leukocytes, UA TRACE (*)    Bacteria, UA RARE (*)    Squamous Epithelial / LPF 6-30 (*)    All other components within normal limits  CULTURE, BLOOD (ROUTINE X 2)  CULTURE, BLOOD (ROUTINE X 2)  URINE CULTURE  LACTIC ACID, PLASMA  LACTIC ACID, PLASMA  POCT PREGNANCY, URINE   ____________________________________________  EKG   ____________________________________________  RADIOLOGY  DG Chest 2 View (Accession [id number]) (Order [id number])  Imaging  Date: 07/08/2016 Department: [facility] EMERGENCY DEPARTMENT Released By: Verlee MonteBryan E Gray, RN (auto-released) Authorizing: Myrna Blazeravid Matthew  Kloie Whiting, MD  PACS Images   Show images for DG Chest 2 View  Addendum   ADDENDUM REPORT: 07/08/2016 21:09  ADDENDUM: These results were called by telephone at the time of interpretation on 07/08/2016 at 9:09 pm to Dr. Loleta RoseORY FORBACH , who verbally acknowledged these results.   Electronically Signed   By: Mitzi HansenLance  Furusawa-Stratton M.D.   On: 07/08/2016 21:09   Addended by Mitzi HansenLance Furusawa-Stratton, MD on 07/08/2016 9:12 PM    Study Result   CLINICAL DATA:  45 y/o  F; flu like symptoms for over a week.  EXAM: CHEST  2 VIEW  COMPARISON:  05/29/2009 chest radiograph.  FINDINGS: Right upper lobe consolidation probably represents pneumonia. Right upper quadrant surgical clips, presumably cholecystectomy. Mild rightward curvature and degenerative changes of the thoracic spine. No pleural effusion. No  pneumothorax. Stable normal cardiomediastinal silhouette.  IMPRESSION: Right upper lobe consolidation likely represents pneumonia. Follow-up to resolution with chest radiograph is recommended.  Electronically Signed: By: Mitzi Hansen M.D. On: 07/08/2016 21:03      ____________________________________________   PROCEDURES  Procedure(s) performed:   Procedures  Critical Care performed:   ____________________________________________   INITIAL IMPRESSION / ASSESSMENT AND PLAN / ED COURSE  Pertinent labs & imaging results that were available during my care of the patient were reviewed by me and considered in my medical decision making (see chart for details).  ----------------------------------------- 10:04 PM on 07/08/2016 -----------------------------------------  Sepsis lower called. Patient with community-acquired pneumonia. To be admitted to the hospital secondary to sepsis. I also retook the patient's temperature was the room with her and it was 100.8. Signed out to Dr. Anne Hahn. The patient is aware of the need for admission and willing to  comply.  Clinical Course     ____________________________________________   FINAL CLINICAL IMPRESSION(S) / ED DIAGNOSES  Sepsis. Community acquired pneumonia. Sinusitis.   NEW MEDICATIONS STARTED DURING THIS VISIT:  New Prescriptions   No medications on file     Note:  This document was prepared using Dragon voice recognition software and may include unintentional dictation errors.    Myrna Blazer, MD 07/08/16 [phone]

## 2016-07-08 NOTE — ED Triage Notes (Addendum)
Patient presents with complaints of flu-like symptoms for over a week. (+) sinus tenderness and bilateral otalgia reported. Patient with (+) N/V as well. (+) SOB and cough; (+) hemoptysis. Patient with myalgias. Reports that she is a [employer] patient; recent HCV diagnosis, but she doesn't trust the Dx that he gave her.

## 2016-07-08 NOTE — ED Notes (Signed)
Pt reports that she has bilat ear pain and headache - she states her entire body hurts and that she has been fighting the flu all week - c/o dizziness and shortness of breath and coughing - cough has been productive off and on (brown sputum) - pt reports no sleep and no solid food intake for the week - she has been drinking water - pt reports nausea/vomiting/diarrhea (vomited water x2 in the last 24 hours, 1 loose stool in the last 24 hours)

## 2016-07-09 ENCOUNTER — Encounter: Payer: Self-pay | Admitting: *Deleted

## 2016-07-09 DIAGNOSIS — E039 Hypothyroidism, unspecified: Secondary | ICD-10-CM | POA: Diagnosis present

## 2016-07-09 DIAGNOSIS — F419 Anxiety disorder, unspecified: Secondary | ICD-10-CM | POA: Diagnosis present

## 2016-07-09 DIAGNOSIS — J189 Pneumonia, unspecified organism: Secondary | ICD-10-CM | POA: Diagnosis present

## 2016-07-09 DIAGNOSIS — A419 Sepsis, unspecified organism: Secondary | ICD-10-CM | POA: Diagnosis present

## 2016-07-09 LAB — POTASSIUM: Potassium: 2.7 mmol/L — CL (ref 3.5–5.1)

## 2016-07-09 LAB — CBC
HEMATOCRIT: 35.8 % (ref 35.0–47.0)
Hemoglobin: 12.6 g/dL (ref 12.0–16.0)
MCH: 32 pg (ref 26.0–34.0)
MCHC: 35.2 g/dL (ref 32.0–36.0)
MCV: 90.9 fL (ref 80.0–100.0)
PLATELETS: 249 10*3/uL (ref 150–440)
RBC: 3.94 MIL/uL (ref 3.80–5.20)
RDW: 15.2 % — AB (ref 11.5–14.5)
WBC: 21.6 10*3/uL — ABNORMAL HIGH (ref 3.6–11.0)

## 2016-07-09 LAB — BASIC METABOLIC PANEL
Anion gap: 8 (ref 5–15)
BUN: 9 mg/dL (ref 6–20)
CHLORIDE: 107 mmol/L (ref 101–111)
CO2: 19 mmol/L — ABNORMAL LOW (ref 22–32)
CREATININE: 0.93 mg/dL (ref 0.44–1.00)
Calcium: 7.8 mg/dL — ABNORMAL LOW (ref 8.9–10.3)
GFR calc Af Amer: >60 mL/min (ref >60)
GFR calc non Af Amer: >60 mL/min (ref >60)
GLUCOSE: 128 mg/dL — AB (ref 65–99)
POTASSIUM: 2.6 mmol/L — AB (ref 3.5–5.1)
Sodium: 134 mmol/L — ABNORMAL LOW (ref 135–145)

## 2016-07-09 MED ORDER — IPRATROPIUM-ALBUTEROL 0.5-2.5 (3) MG/3ML IN SOLN: 3.0000 mL | RESPIRATORY_TRACT | Status: DC | PRN

## 2016-07-09 MED ORDER — ACETAMINOPHEN 650 MG RE SUPP: 650.0000 mg | Freq: Four times a day (QID) | RECTAL | Status: DC | PRN

## 2016-07-09 MED ORDER — DEXTROSE 5 % IV SOLN
500.0000 mg | INTRAVENOUS | Status: DC
  Administered 2016-07-09 – 2016-07-10 (×2): 500 mg via INTRAVENOUS
  Filled 2016-07-09 (×3): qty 500

## 2016-07-09 MED ORDER — ONDANSETRON HCL 4 MG PO TABS
4.0000 mg | ORAL_TABLET | Freq: Four times a day (QID) | ORAL | Status: DC | PRN
  Administered 2016-07-09: 16:00:00 4 mg via ORAL
  Filled 2016-07-09: qty 1

## 2016-07-09 MED ORDER — POTASSIUM CHLORIDE CRYS ER 20 MEQ PO TBCR
40.0000 meq | EXTENDED_RELEASE_TABLET | ORAL | Status: AC
  Administered 2016-07-09 (×2): 40 meq via ORAL
  Filled 2016-07-09 (×2): qty 2

## 2016-07-09 MED ORDER — ACETAMINOPHEN 325 MG PO TABS
650.0000 mg | ORAL_TABLET | Freq: Four times a day (QID) | ORAL | Status: DC | PRN
  Administered 2016-07-09: 650 mg via ORAL
  Filled 2016-07-09: qty 2

## 2016-07-09 MED ORDER — CYCLOBENZAPRINE HCL 5 MG PO TABS
7.5000 mg | ORAL_TABLET | Freq: Three times a day (TID) | ORAL | Status: AC
  Administered 2016-07-09 – 2016-07-11 (×6): 7.5 mg via ORAL
  Filled 2016-07-09 (×8): qty 1.5

## 2016-07-09 MED ORDER — ENOXAPARIN SODIUM 40 MG/0.4ML ~~LOC~~ SOLN
40.0000 mg | SUBCUTANEOUS | Status: DC
  Administered 2016-07-09 – 2016-07-10 (×2): 40 mg via SUBCUTANEOUS
  Filled 2016-07-09 (×2): qty 0.4

## 2016-07-09 MED ORDER — DEXTROSE 5 % IV SOLN
1.0000 g | INTRAVENOUS | Status: DC
  Administered 2016-07-09 – 2016-07-10 (×2): 1 g via INTRAVENOUS
  Filled 2016-07-09 (×2): qty 10

## 2016-07-09 MED ORDER — ONDANSETRON HCL 4 MG/2ML IJ SOLN: 4.0000 mg | Freq: Four times a day (QID) | INTRAMUSCULAR | Status: DC | PRN

## 2016-07-09 MED ORDER — PANTOPRAZOLE SODIUM 40 MG PO TBEC
40.0000 mg | DELAYED_RELEASE_TABLET | Freq: Two times a day (BID) | ORAL | Status: DC
  Administered 2016-07-09 – 2016-07-11 (×4): 40 mg via ORAL
  Filled 2016-07-09 (×4): qty 1

## 2016-07-09 MED ORDER — BUTALBITAL-APAP-CAFFEINE 50-325-40 MG PO TABS
1.0000 | ORAL_TABLET | Freq: Four times a day (QID) | ORAL | Status: DC | PRN
  Administered 2016-07-09 – 2016-07-10 (×3): 1 via ORAL
  Filled 2016-07-09 (×7): qty 1

## 2016-07-09 MED ORDER — LEVOTHYROXINE SODIUM 100 MCG PO TABS
200.0000 ug | ORAL_TABLET | Freq: Every day | ORAL | Status: DC
  Administered 2016-07-09 – 2016-07-11 (×3): 200 ug via ORAL
  Filled 2016-07-09 (×3): qty 2

## 2016-07-09 MED ORDER — ALPRAZOLAM 0.5 MG PO TABS
0.5000 mg | ORAL_TABLET | Freq: Every evening | ORAL | Status: DC | PRN
  Filled 2016-07-09: qty 1

## 2016-07-09 MED ORDER — ALUM & MAG HYDROXIDE-SIMETH 200-200-20 MG/5ML PO SUSP: 30.0000 mL | Freq: Four times a day (QID) | ORAL | Status: DC | PRN

## 2016-07-09 NOTE — Progress Notes (Signed)
[employer] [facility] [place] [facility]   PATIENT NAME: Dawn Kane    MR#:  [id number]  DATE OF BIRTH:  [birth year]  SUBJECTIVE:  CHIEF COMPLAINT:   Chief Complaint  Patient presents with  . Influenza  . Facial Pain  . Otalgia  . Nausea  . Emesis   - still fevers last night, right sided chest pain especially on deep breathing - feels weak  REVIEW OF SYSTEMS:  Review of Systems  Constitutional: Positive for chills and fever. Negative for malaise/fatigue.  HENT: Negative for ear discharge, ear pain and nosebleeds.   Respiratory: Positive for cough and shortness of breath. Negative for wheezing.   Cardiovascular: Positive for chest pain. Negative for palpitations and leg swelling.  Gastrointestinal: Negative for abdominal pain, constipation, diarrhea, nausea and vomiting.  Genitourinary: Negative for dysuria and urgency.  Musculoskeletal: Positive for back pain and myalgias.  Neurological: Negative for dizziness, sensory change, speech change, focal weakness, seizures and headaches.  Psychiatric/Behavioral: Negative for depression.    DRUG ALLERGIES:   Allergies  Allergen Reactions  . Amoxicillin Nausea And Vomiting    Has patient had a PCN reaction causing immediate rash, facial/tongue/throat swelling, SOB or lightheadedness with hypotension: No Has patient had a PCN reaction causing severe rash involving mucus membranes or skin necrosis: No Has patient had a PCN reaction that required hospitalization No Has patient had a PCN reaction occurring within the last 10 years: No If all of the above answers are "NO", then may proceed with Cephalosporin use.   . Aspirin Nausea And Vomiting    VITALS:  Blood pressure (!) 144/75, pulse 99, temperature 98.8 F (37.1 C), temperature source Oral, resp. rate 18, height 5\' 2"  (1.575 m), weight 76.2 kg (168 lb 1.6 oz), last menstrual period 06/17/2016, SpO2 94 %.  PHYSICAL EXAMINATION:  Physical Exam  GENERAL:  45  y.o.-year-old patient lying in the bed with no acute distress.  EYES: Pupils equal, round, reactive to light and accommodation. No scleral icterus. Extraocular muscles intact.  HEENT: Head atraumatic, normocephalic. Oropharynx and nasopharynx clear.  NECK:  Supple, no jugular venous distention. No thyroid enlargement, no tenderness.  LUNGS: Normal breath sounds bilaterally, no wheezing, rales,rhonchi or crepitation except right basilar rhonchi. No use of accessory muscles of respiration.  CARDIOVASCULAR: S1, S2 normal. No murmurs, rubs, or gallops.  ABDOMEN: Soft, nontender, nondistended. Bowel sounds present. No organomegaly or mass.  EXTREMITIES: No pedal edema, cyanosis, or clubbing.  NEUROLOGIC: Cranial nerves II through XII are intact. Muscle strength 5/5 in all extremities. Sensation intact. Gait not checked.  PSYCHIATRIC: The patient is alert and oriented x 3.  SKIN: No obvious rash, lesion, or ulcer.    LABORATORY PANEL:   CBC  Recent Labs Lab 07/09/16 0605  WBC 21.6*  HGB 12.6  HCT 35.8  PLT 249   ------------------------------------------------------------------------------------------------------------------  Chemistries   Recent Labs Lab 07/08/16 2004 07/09/16 0605  NA 132* 134*  K 2.9* 2.6*  CL 101 107  CO2 22 19*  GLUCOSE 118* 128*  BUN 10 9  CREATININE 0.99 0.93  CALCIUM 8.9 7.8*  AST 39  --   ALT 36  --   ALKPHOS 165*  --   BILITOT 1.1  --    ------------------------------------------------------------------------------------------------------------------  Cardiac Enzymes No results for input(s): TROPONINI in the last 168 hours. ------------------------------------------------------------------------------------------------------------------  RADIOLOGY:  Dg Chest 2 View  Addendum Date: 07/08/2016   ADDENDUM REPORT: 07/08/2016 21:09 ADDENDUM: These results were called by telephone at the time  of interpretation on 07/08/2016 at 9:09 pm to Dr. Loleta RoseORY  FORBACH , who verbally acknowledged these results. Electronically Signed   By: Mitzi HansenLance  Furusawa-Stratton M.D.   On: 07/08/2016 21:09   Result Date: 07/08/2016 CLINICAL DATA:  45 y/o  F; flu like symptoms for over a week. EXAM: CHEST  2 VIEW COMPARISON:  05/29/2009 chest radiograph. FINDINGS: Right upper lobe consolidation probably represents pneumonia. Right upper quadrant surgical clips, presumably cholecystectomy. Mild rightward curvature and degenerative changes of the thoracic spine. No pleural effusion. No pneumothorax. Stable normal cardiomediastinal silhouette. IMPRESSION: Right upper lobe consolidation likely represents pneumonia. Follow-up to resolution with chest radiograph is recommended. Electronically Signed: By: Mitzi HansenLance  Furusawa-Stratton M.D. On: 07/08/2016 21:03    EKG:   Orders placed or performed in visit on 05/29/09  . EKG 12-Lead    ASSESSMENT AND PLAN:   45 year old female with past medical history significant for COPD not on home oxygen, anxiety, hypertension, hypothyroidism presents to the hospital secondary to sepsis from pneumonia  #1 sepsis-from community-acquired pneumonia. -Chest x-ray with right upper lobe infiltrate. -Blood cultures are pending. -Continue Rocephin and azithromycin  #2 chest pain-secondary to pleurisy. -Muscle relaxants will be added  #3 COPD-stable. No indication for steroids. -Continue inhalers  #4 hypokalemia-being replaced. Recheck later today  #5 hypothyroidism-continue Synthroid  #6 DVT prophylaxis-on Lovenox     All the records are reviewed and case discussed with Care Management/Social Workerr. Management plans discussed with the patient, family and they are in agreement.  CODE STATUS: Full Code  TOTAL TIME TAKING CARE OF THIS PATIENT: 37 minutes.   POSSIBLE D/C IN 1-2 DAYS, DEPENDING ON CLINICAL CONDITION.   Muhammad Vacca M.D on 07/09/2016 at 1:10 PM  Between 7am to 6pm - Pager - [phone]  After 6pm go to  [email or web address] - Social research officer, governmentpassword [email or web address]  [facility]  Office  [phone]  CC: Primary care physician; No PCP Per Patient

## 2016-07-09 NOTE — Progress Notes (Signed)
Pharmacy Antibiotic Note  Dawn Kane is a 45 y.o. female admitted on 07/08/2016 with pneumonia.  Pharmacy has been consulted for ceftriaxone and azithromycin dosing dosing.  Plan: Ceftriaxone 1 gram q 24 hours ordered. Azithromycin 500 mg q 24 hours ordered.  Height: 5' 2.5" (158.8 cm) Weight: 160 lb (72.6 kg) IBW/kg (Calculated) : 51.25  Temp (24hrs), Avg:99.2 F (37.3 C), Min:99.2 F (37.3 C), Max:99.2 F (37.3 C)   Recent Labs Lab 07/08/16 2004 07/08/16 2303  WBC 24.7*  --   CREATININE 0.99  --   LATICACIDVEN  --  1.0    Estimated Creatinine Clearance: 67 mL/min (by C-G formula based on SCr of 0.99 mg/dL).    Allergies  Allergen Reactions  . Amoxicillin Nausea And Vomiting    Has patient had a PCN reaction causing immediate rash, facial/tongue/throat swelling, SOB or lightheadedness with hypotension: No Has patient had a PCN reaction causing severe rash involving mucus membranes or skin necrosis: No Has patient had a PCN reaction that required hospitalization No Has patient had a PCN reaction occurring within the last 10 years: No If all of the above answers are "NO", then may proceed with Cephalosporin use.   . Aspirin Nausea And Vomiting    Antimicrobials this admission: ceftriaxone  >>  azithromycin  >>   Dose adjustments this admission:   Microbiology results: 9/22 BCx: pending 9/22 UCx: pending    9/22 CXR: RUL consolidation 9/22 UA: LE(tr) NO2(-) WBC 0-5  Thank you for allowing pharmacy to be a part of this patient's care.  Lonia Roane S 07/09/2016 1:31 AM

## 2016-07-09 NOTE — H&P (Signed)
[facility] [place] [facility]  PATIENT NAME: Dawn Kane    MR#:  [id number]  DATE OF BIRTH:  11/26/1970  DATE OF ADMISSION:  07/08/2016  PRIMARY CARE PHYSICIAN: No PCP Per Patient   REQUESTING/REFERRING PHYSICIAN: York CeriseForbach, MD  CHIEF COMPLAINT:   Chief Complaint  Patient presents with  . Influenza  . Facial Pain  . Otalgia  . Nausea  . Emesis    HISTORY OF PRESENT ILLNESS:  Dawn Kane  is a 45 y.o. female who presents with 3-4 days of progressive cough, pleurodynia, shortness of breath, malaise, fevers and chills. Patient was found in the ED to meet sepsis criteria, was found to have right upper lobe pneumonia on chest x-ray. Hospitalists were called for admission.  PAST MEDICAL HISTORY:   Past Medical History:  Diagnosis Date  . Anxiety   . COPD (chronic obstructive pulmonary disease) (HCC)   . GERD (gastroesophageal reflux disease)   . Hyperlipidemia   . Hypertension   . Hypothyroidism   . Obesity     PAST SURGICAL HISTORY:   Past Surgical History:  Procedure Laterality Date  . CLAVICLE SURGERY    . LUNG SURGERY    . MANDIBLE SURGERY      SOCIAL HISTORY:   Social History  Substance Use Topics  . Smoking status: Current Every Day Smoker  . Smokeless tobacco: Never Used  . Alcohol use Yes     Comment: social    FAMILY HISTORY:   Family History  Problem Relation Age of Onset  . Heart disease Paternal Grandmother   . Cancer Paternal Grandfather     pancreatic    DRUG ALLERGIES:   Allergies  Allergen Reactions  . Amoxicillin Nausea And Vomiting    Has patient had a PCN reaction causing immediate rash, facial/tongue/throat swelling, SOB or lightheadedness with hypotension: No Has patient had a PCN reaction causing severe rash involving mucus membranes or skin necrosis: No Has patient had a PCN reaction that required hospitalization No Has patient had a PCN reaction occurring within the last 10 years: No If all  of the above answers are "NO", then may proceed with Cephalosporin use.   . Aspirin Nausea And Vomiting    MEDICATIONS AT HOME:   Prior to Admission medications   Medication Sig Start Date End Date Taking? Authorizing Provider  albuterol (PROVENTIL HFA;VENTOLIN HFA) 108 (90 BASE) MCG/ACT inhaler Inhale 2 puffs into the lungs every 6 (six) hours as needed for wheezing or shortness of breath.   Yes Historical Provider, MD  ALPRAZolam Prudy Feeler(XANAX) 0.5 MG tablet Take 0.5 mg by mouth at bedtime as needed for anxiety.   Yes Historical Provider, MD  ibuprofen (ADVIL,MOTRIN) 200 MG tablet Take 800 mg by mouth every 6 (six) hours as needed.   Yes Historical Provider, MD  levothyroxine (SYNTHROID, LEVOTHROID) 200 MCG tablet Take 200 mcg by mouth daily before breakfast.    Yes Historical Provider, MD    REVIEW OF SYSTEMS:  Review of Systems  Constitutional: Positive for chills, fever and malaise/fatigue. Negative for weight loss.  HENT: Negative for ear pain, hearing loss and tinnitus.   Eyes: Negative for blurred vision, double vision, pain and redness.  Respiratory: Positive for cough and shortness of breath. Negative for hemoptysis.   Cardiovascular: Positive for chest pain (pleurodynia). Negative for palpitations, orthopnea and leg swelling.  Gastrointestinal: Negative for abdominal pain, constipation, diarrhea, nausea and vomiting.  Genitourinary: Negative for dysuria, frequency and hematuria.  Musculoskeletal: Negative for  back pain, joint pain and neck pain.  Skin:       No acne, rash, or lesions  Neurological: Negative for dizziness, tremors, focal weakness and weakness.  Endo/Heme/Allergies: Negative for polydipsia. Does not bruise/bleed easily.  Psychiatric/Behavioral: Negative for depression. The patient is not nervous/anxious and does not have insomnia.      VITAL SIGNS:   Vitals:   07/08/16 2000 07/08/16 2001 07/08/16 2333  BP: (!) 155/97  (!) 151/88  Pulse: (!) 111  98  Resp: (!)  22  18  Temp: 99.2 F (37.3 C)    TempSrc: Oral    SpO2: 92%  94%  Weight:  72.6 kg (160 lb)   Height:  5' 2.5" (1.588 m)    Wt Readings from Last 3 Encounters:  07/08/16 72.6 kg (160 lb)  08/23/13 78.9 kg (174 lb)    PHYSICAL EXAMINATION:  Physical Exam  Vitals reviewed. Constitutional: She is oriented to person, place, and time. She appears well-developed and well-nourished. No distress.  HENT:  Head: Normocephalic and atraumatic.  Mouth/Throat: Oropharynx is clear and moist.  Eyes: Conjunctivae and EOM are normal. Pupils are equal, round, and reactive to light. No scleral icterus.  Neck: Normal range of motion. Neck supple. No JVD present. No thyromegaly present.  Cardiovascular: Normal rate, regular rhythm and intact distal pulses.  Exam reveals no gallop and no friction rub.   No murmur heard. Respiratory: Effort normal. No respiratory distress. She has no wheezes. She has no rales.  Right upper lobe rhonchi  GI: Soft. Bowel sounds are normal. She exhibits no distension. There is no tenderness.  Musculoskeletal: Normal range of motion. She exhibits no edema.  No arthritis, no gout  Lymphadenopathy:    She has no cervical adenopathy.  Neurological: She is alert and oriented to person, place, and time. No cranial nerve deficit.  No dysarthria, no aphasia  Skin: Skin is warm and dry. No rash noted. No erythema.  Psychiatric: She has a normal mood and affect. Her behavior is normal. Judgment and thought content normal.    LABORATORY PANEL:   CBC  Recent Labs Lab 07/08/16 2004  WBC 24.7*  HGB 14.1  HCT 41.7  PLT 299   ------------------------------------------------------------------------------------------------------------------  Chemistries   Recent Labs Lab 07/08/16 2004  NA 132*  K 2.9*  CL 101  CO2 22  GLUCOSE 118*  BUN 10  CREATININE 0.99  CALCIUM 8.9  AST 39  ALT 36  ALKPHOS 165*  BILITOT 1.1    ------------------------------------------------------------------------------------------------------------------  Cardiac Enzymes No results for input(s): TROPONINI in the last 168 hours. ------------------------------------------------------------------------------------------------------------------  RADIOLOGY:  Dg Chest 2 View  Addendum Date: 07/08/2016   ADDENDUM REPORT: 07/08/2016 21:09 ADDENDUM: These results were called by telephone at the time of interpretation on 07/08/2016 at 9:09 pm to Dr. Loleta Rose , who verbally acknowledged these results. Electronically Signed   By: Mitzi Hansen M.D.   On: 07/08/2016 21:09   Result Date: 07/08/2016 CLINICAL DATA:  45 y/o  F; flu like symptoms for over a week. EXAM: CHEST  2 VIEW COMPARISON:  05/29/2009 chest radiograph. FINDINGS: Right upper lobe consolidation probably represents pneumonia. Right upper quadrant surgical clips, presumably cholecystectomy. Mild rightward curvature and degenerative changes of the thoracic spine. No pleural effusion. No pneumothorax. Stable normal cardiomediastinal silhouette. IMPRESSION: Right upper lobe consolidation likely represents pneumonia. Follow-up to resolution with chest radiograph is recommended. Electronically Signed: By: Mitzi Hansen M.D. On: 07/08/2016 21:03    EKG:   Orders placed  or performed in visit on 05/29/09  . EKG 12-Lead    IMPRESSION AND PLAN:  Principal Problem:   Sepsis (HCC) - IV antibiotics started in the ED and continued on admission, cultures sent from the ED, hemodynamically stable, lactic acid within normal limits Active Problems:   CAP (community acquired pneumonia) - antibiotics and cultures as above   Anxiety - home dose anxiolytics   Hypothyroidism - home dose thyroid replacement  All the records are reviewed and case discussed with ED provider. Management plans discussed with the patient and/or family.  DVT PROPHYLAXIS: SubQ lovenox  GI  PROPHYLAXIS: None  ADMISSION STATUS: Inpatient  CODE STATUS: Full Code Status History    This patient does not have a recorded code status. Please follow your organizational policy for patients in this situation.      TOTAL TIME TAKING CARE OF THIS PATIENT: 45 minutes.    Lexee Brashears FIELDING 07/09/2016, 1:06 AM  [facility]  Office  [phone]  CC: Primary care physician; No PCP Per Patient

## 2016-07-10 ENCOUNTER — Inpatient Hospital Stay: Payer: Self-pay

## 2016-07-10 LAB — URINE CULTURE

## 2016-07-10 LAB — BASIC METABOLIC PANEL
Anion gap: 6 (ref 5–15)
BUN: 11 mg/dL (ref 6–20)
CHLORIDE: 109 mmol/L (ref 101–111)
CO2: 19 mmol/L — AB (ref 22–32)
CREATININE: 0.78 mg/dL (ref 0.44–1.00)
Calcium: 8.1 mg/dL — ABNORMAL LOW (ref 8.9–10.3)
GFR calc Af Amer: >60 mL/min (ref >60)
GFR calc non Af Amer: >60 mL/min (ref >60)
GLUCOSE: 97 mg/dL (ref 65–99)
Potassium: 2.8 mmol/L — ABNORMAL LOW (ref 3.5–5.1)
Sodium: 134 mmol/L — ABNORMAL LOW (ref 135–145)

## 2016-07-10 LAB — CBC
HCT: 35.1 % (ref 35.0–47.0)
HEMOGLOBIN: 12.3 g/dL (ref 12.0–16.0)
MCH: 31.7 pg (ref 26.0–34.0)
MCHC: 34.9 g/dL (ref 32.0–36.0)
MCV: 90.7 fL (ref 80.0–100.0)
PLATELETS: 272 10*3/uL (ref 150–440)
RBC: 3.87 MIL/uL (ref 3.80–5.20)
RDW: 15.5 % — ABNORMAL HIGH (ref 11.5–14.5)
WBC: 17.2 10*3/uL — AB (ref 3.6–11.0)

## 2016-07-10 LAB — C DIFFICILE QUICK SCREEN W PCR REFLEX
C DIFFICILE (CDIFF) INTERP: NOT DETECTED
C DIFFICILE (CDIFF) TOXIN: NEGATIVE
C DIFFICLE (CDIFF) ANTIGEN: NEGATIVE

## 2016-07-10 MED ORDER — POTASSIUM CHLORIDE CRYS ER 20 MEQ PO TBCR
40.0000 meq | EXTENDED_RELEASE_TABLET | ORAL | Status: AC
  Administered 2016-07-10 (×2): 40 meq via ORAL
  Filled 2016-07-10 (×2): qty 2

## 2016-07-10 MED ORDER — RISAQUAD PO CAPS
1.0000 | ORAL_CAPSULE | Freq: Two times a day (BID) | ORAL | Status: DC
  Administered 2016-07-10 – 2016-07-11 (×3): 1 via ORAL
  Filled 2016-07-10 (×3): qty 1

## 2016-07-10 NOTE — Progress Notes (Signed)
A&O patient resting well this shift. No c/o pain or discomfort noted this shift. Pt is on off unit telemetry, NSR. Vital signs stable. Pt is moderate fall risk and is independent up to the BR. Improving, possible discharge 1-2 days.

## 2016-07-10 NOTE — Progress Notes (Signed)
[facility] [place] at [facility]   PATIENT NAME: Dawn Kane    MR#:  [id number]  DATE OF BIRTH:  03/28/1971  SUBJECTIVE:  CHIEF COMPLAINT:   Chief Complaint  Patient presents with  . Influenza  . Facial Pain  . Otalgia  . Nausea  . Emesis   -Still has right-sided chest pain. Diarrhea started yesterday morning after eating breakfast  REVIEW OF SYSTEMS:  Review of Systems  Constitutional: Positive for fever. Negative for chills and malaise/fatigue.  HENT: Negative for ear discharge, ear pain and nosebleeds.   Respiratory: Positive for cough and shortness of breath. Negative for wheezing.   Cardiovascular: Positive for chest pain. Negative for palpitations and leg swelling.  Gastrointestinal: Positive for diarrhea. Negative for abdominal pain, constipation, nausea and vomiting.  Genitourinary: Negative for dysuria and urgency.  Musculoskeletal: Positive for myalgias. Negative for back pain.  Neurological: Negative for dizziness, sensory change, speech change, focal weakness, seizures and headaches.  Psychiatric/Behavioral: Negative for depression.    DRUG ALLERGIES:   Allergies  Allergen Reactions  . Amoxicillin Nausea And Vomiting    Has patient had a PCN reaction causing immediate rash, facial/tongue/throat swelling, SOB or lightheadedness with hypotension: No Has patient had a PCN reaction causing severe rash involving mucus membranes or skin necrosis: No Has patient had a PCN reaction that required hospitalization No Has patient had a PCN reaction occurring within the last 10 years: No If all of the above answers are "NO", then may proceed with Cephalosporin use.   . Aspirin Nausea And Vomiting    VITALS:  Blood pressure 122/74, pulse 78, temperature 99.5 F (37.5 C), temperature source Oral, resp. rate 18, height 5\' 2"  (1.575 m), weight 76.2 kg (168 lb 1.6 oz), last menstrual period 06/17/2016, SpO2 94 %.  PHYSICAL EXAMINATION:  Physical  Exam  GENERAL:  45 y.o.-year-old patient lying in the bed with no acute distress.  EYES: Pupils equal, round, reactive to light and accommodation. No scleral icterus. Extraocular muscles intact.  HEENT: Head atraumatic, normocephalic. Oropharynx and nasopharynx clear.  NECK:  Supple, no jugular venous distention. No thyroid enlargement, no tenderness.  LUNGS: Normal breath sounds bilaterally, no wheezing, rales,rhonchi or crepitation except right basilar rhonchi. No use of accessory muscles of respiration.  CARDIOVASCULAR: S1, S2 normal. No murmurs, rubs, or gallops.  ABDOMEN: Soft, nontender, nondistended. Bowel sounds present. No organomegaly or mass.  EXTREMITIES: No pedal edema, cyanosis, or clubbing.  NEUROLOGIC: Cranial nerves II through XII are intact. Muscle strength 5/5 in all extremities. Sensation intact. Gait not checked.  PSYCHIATRIC: The patient is alert and oriented x 3.  SKIN: No obvious rash, lesion, or ulcer.    LABORATORY PANEL:   CBC  Recent Labs Lab 07/10/16 0744  WBC 17.2*  HGB 12.3  HCT 35.1  PLT 272   ------------------------------------------------------------------------------------------------------------------  Chemistries   Recent Labs Lab 07/08/16 2004  07/10/16 0744  NA 132*  < > 134*  K 2.9*  < > 2.8*  CL 101  < > 109  CO2 22  < > 19*  GLUCOSE 118*  < > 97  BUN 10  < > 11  CREATININE 0.99  < > 0.78  CALCIUM 8.9  < > 8.1*  AST 39  --   --   ALT 36  --   --   ALKPHOS 165*  --   --   BILITOT 1.1  --   --   < > = values in this interval not  displayed. ------------------------------------------------------------------------------------------------------------------  Cardiac Enzymes No results for input(s): TROPONINI in the last 168 hours. ------------------------------------------------------------------------------------------------------------------  RADIOLOGY:  Dg Chest 2 View  Result Date: 07/10/2016 CLINICAL DATA:  Pneumonia  EXAM: CHEST  2 VIEW COMPARISON:  07/08/2016 FINDINGS: Right upper lobe consolidation/pneumonia. Additional mild patchy right lower lobe opacity. Mild left lower lobe opacity. No pleural effusion or pneumothorax. The heart is normal in size. Degenerative changes of the visualized thoracolumbar spine. Cholecystectomy clips. IMPRESSION: Multifocal pneumonia, right upper lobe predominant, with increasing bilateral lower lobe opacity. Electronically Signed   By: Charline BillsSriyesh  Krishnan M.D.   On: 07/10/2016 10:02   Dg Chest 2 View  Addendum Date: 07/08/2016   ADDENDUM REPORT: 07/08/2016 21:09 ADDENDUM: These results were called by telephone at the time of interpretation on 07/08/2016 at 9:09 pm to Dr. Loleta RoseORY FORBACH , who verbally acknowledged these results. Electronically Signed   By: Mitzi HansenLance  Furusawa-Stratton M.D.   On: 07/08/2016 21:09   Result Date: 07/08/2016 CLINICAL DATA:  45 y/o  F; flu like symptoms for over a week. EXAM: CHEST  2 VIEW COMPARISON:  05/29/2009 chest radiograph. FINDINGS: Right upper lobe consolidation probably represents pneumonia. Right upper quadrant surgical clips, presumably cholecystectomy. Mild rightward curvature and degenerative changes of the thoracic spine. No pleural effusion. No pneumothorax. Stable normal cardiomediastinal silhouette. IMPRESSION: Right upper lobe consolidation likely represents pneumonia. Follow-up to resolution with chest radiograph is recommended. Electronically Signed: By: Mitzi HansenLance  Furusawa-Stratton M.D. On: 07/08/2016 21:03    EKG:   Orders placed or performed in visit on 05/29/09  . EKG 12-Lead    ASSESSMENT AND PLAN:   45 year old female with past medical history significant for COPD not on home oxygen, anxiety, hypertension, hypothyroidism presents to the hospital secondary to sepsis from pneumonia  #1 sepsis-from community-acquired pneumonia. -Chest x-ray with right upper lobe infiltrate and multi lobar infiltrate. -Blood cultures are  negative. -Continue Rocephin and azithromycin -Chest x-ray repeat also showing the same  #2 Diarrhea-thinks it could be from sausage as usually it causes diarrhea. -Ordered probiotics. C. difficile ordered as she is on antibiotics  #3 COPD-stable. No indication for steroids. -Continue inhalers  #4 hypokalemia-being replaced.Continues to have low potassium, likely from her ongoing diarrhea. Being replaced.   #5 hypothyroidism-continue Synthroid  #6 DVT prophylaxis-on Lovenox  Anticipate discharge tomorrow if improving   All the records are reviewed and case discussed with Care Management/Social Workerr. Management plans discussed with the patient, family and they are in agreement.  CODE STATUS: Full Code  TOTAL TIME TAKING CARE OF THIS PATIENT: 37 minutes.   POSSIBLE D/C IN 1-2 DAYS, DEPENDING ON CLINICAL CONDITION.   Enid BaasKALISETTI,Johnrobert Foti M.D on 07/10/2016 at 12:58 PM  Between 7am to 6pm - Pager - [phone]  After 6pm go to [email or web address] - Social research officer, governmentpassword EPAS [facility]  [facility]  Office  [phone]  CC: Primary care physician; No PCP Per Patient

## 2016-07-11 LAB — BASIC METABOLIC PANEL
ANION GAP: 5 (ref 5–15)
BUN: 11 mg/dL (ref 6–20)
CO2: 20 mmol/L — AB (ref 22–32)
Calcium: 8.6 mg/dL — ABNORMAL LOW (ref 8.9–10.3)
Chloride: 112 mmol/L — ABNORMAL HIGH (ref 101–111)
Creatinine, Ser: 0.83 mg/dL (ref 0.44–1.00)
GFR calc Af Amer: >60 mL/min (ref >60)
GFR calc non Af Amer: >60 mL/min (ref >60)
GLUCOSE: 92 mg/dL (ref 65–99)
POTASSIUM: 3.3 mmol/L — AB (ref 3.5–5.1)
Sodium: 137 mmol/L (ref 135–145)

## 2016-07-11 LAB — CBC
HEMATOCRIT: 37.9 % (ref 35.0–47.0)
Hemoglobin: 12.8 g/dL (ref 12.0–16.0)
MCH: 31 pg (ref 26.0–34.0)
MCHC: 33.8 g/dL (ref 32.0–36.0)
MCV: 91.6 fL (ref 80.0–100.0)
Platelets: 288 10*3/uL (ref 150–440)
RBC: 4.13 MIL/uL (ref 3.80–5.20)
RDW: 15.9 % — ABNORMAL HIGH (ref 11.5–14.5)
WBC: 13.8 10*3/uL — AB (ref 3.6–11.0)

## 2016-07-11 MED ORDER — CYCLOBENZAPRINE HCL 7.5 MG PO TABS: 7.5000 mg | ORAL_TABLET | Freq: Three times a day (TID) | ORAL | 0 refills | Status: DC

## 2016-07-11 MED ORDER — CEFUROXIME AXETIL 500 MG PO TABS: 500.0000 mg | ORAL_TABLET | Freq: Two times a day (BID) | ORAL | 0 refills | Status: DC

## 2016-07-11 MED ORDER — BUTALBITAL-APAP-CAFFEINE 50-325-40 MG PO TABS: 1.0000 | ORAL_TABLET | Freq: Four times a day (QID) | ORAL | 0 refills | Status: DC | PRN

## 2016-07-11 MED ORDER — AZITHROMYCIN 500 MG PO TABS: 500.0000 mg | ORAL_TABLET | Freq: Every day | ORAL | 0 refills | Status: DC

## 2016-07-11 MED ORDER — RISAQUAD PO CAPS: 1.0000 | ORAL_CAPSULE | Freq: Two times a day (BID) | ORAL | 0 refills | Status: DC

## 2016-07-11 MED ORDER — POTASSIUM CHLORIDE CRYS ER 20 MEQ PO TBCR
40.0000 meq | EXTENDED_RELEASE_TABLET | ORAL | Status: DC
  Administered 2016-07-11: 08:00:00 40 meq via ORAL
  Filled 2016-07-11: qty 2

## 2016-07-11 NOTE — Progress Notes (Signed)
     Dawn Kane was admitted to the [facility] on 07/08/2016 for an acute medical condition and is being Discharged on  07/11/2016 . She will need another 3 days for recovery and so advised to stay away from work until then. So please excuse her from work for the above days. Should be able to return to work without any restrictions from 07/14/16  Call Enid Baasadhika Keishaun Hazel  MD, [facility] at  [phone] with questions.  Enid BaasKALISETTI,Kao Berkheimer M.D on 07/11/2016,at 8:15 AM  [facility] [address], [place] [id number]

## 2016-07-11 NOTE — Discharge Summary (Signed)
[facility] [place] [facility]   PATIENT NAME: Dawn Kane    MR#:  [id number]  DATE OF BIRTH:  26-Aug-1971  DATE OF ADMISSION:  07/08/2016   ADMITTING PHYSICIAN: Oralia Manis, MD  DATE OF DISCHARGE: 07/11/2016  PRIMARY CARE PHYSICIAN: No PCP Per Patient   ADMISSION DIAGNOSIS:   Community acquired pneumonia [J18.9] Sepsis, due to unspecified organism (HCC) [A41.9]  DISCHARGE DIAGNOSIS:   Principal Problem:   Sepsis (HCC) Active Problems:   CAP (community acquired pneumonia)   Anxiety   Hypothyroidism   SECONDARY DIAGNOSIS:   Past Medical History:  Diagnosis Date  . Anxiety   . COPD (chronic obstructive pulmonary disease) (HCC)   . GERD (gastroesophageal reflux disease)   . Hyperlipidemia   . Hypertension   . Hypothyroidism   . Obesity     HOSPITAL COURSE:   45 year old female with past medical history significant for COPD not on home oxygen, anxiety, hypertension, hypothyroidism presents to the hospital secondary to sepsis from pneumonia  #1 sepsis-from community-acquired pneumonia. -Chest x-ray with right upper lobe infiltrate and multi lobar infiltrate. -Blood cultures are negative. -Received Rocephin and azithromycin-changed to Ceftin and azithromycin at discharge -Much improving pleurisy chest pain on the right side. Continue muscle relaxant  #2 diarrhea-secondary to sausage that she ate. Improving. -Ordered probiotics while on antibiotics. C. difficile negative  #3 COPD-stable. No indication for steroids. -Continue inhalers  #4 hypokalemia-replaced. likely from her diarrhea. Being replaced.   #5 hypothyroidism-continue Synthroid  Discharge today  DISCHARGE CONDITIONS:   STABLE  CONSULTS OBTAINED:   NONE  DRUG ALLERGIES:   Allergies  Allergen Reactions  . Amoxicillin Nausea And Vomiting    Has patient had a PCN reaction causing immediate rash, facial/tongue/throat swelling, SOB or lightheadedness with  hypotension: No Has patient had a PCN reaction causing severe rash involving mucus membranes or skin necrosis: No Has patient had a PCN reaction that required hospitalization No Has patient had a PCN reaction occurring within the last 10 years: No If all of the above answers are "NO", then may proceed with Cephalosporin use.   . Aspirin Nausea And Vomiting   DISCHARGE MEDICATIONS:     Medication List    STOP taking these medications   ibuprofen 200 MG tablet Commonly known as:  ADVIL,MOTRIN     TAKE these medications   acidophilus Caps capsule Take 1 capsule by mouth 2 (two) times daily. X 5 days while on antibiotics   albuterol 108 (90 Base) MCG/ACT inhaler Commonly known as:  PROVENTIL HFA;VENTOLIN HFA Inhale 2 puffs into the lungs every 6 (six) hours as needed for wheezing or shortness of breath.   ALPRAZolam 0.5 MG tablet Commonly known as:  XANAX Take 0.5 mg by mouth at bedtime as needed for anxiety.   azithromycin 500 MG tablet Commonly known as:  ZITHROMAX Take 1 tablet (500 mg total) by mouth daily. X 3 more days   butalbital-acetaminophen-caffeine 50-325-40 MG tablet Commonly known as:  FIORICET, ESGIC Take 1 tablet by mouth every 6 (six) hours as needed for headache.   cefUROXime 500 MG tablet Commonly known as:  CEFTIN Take 1 tablet (500 mg total) by mouth 2 (two) times daily with a meal. X 5 more days   cyclobenzaprine 7.5 MG tablet Commonly known as:  FEXMID Take 1 tablet (7.5 mg total) by mouth 3 (three) times daily.   levothyroxine 200 MCG tablet Commonly known as:  SYNTHROID, LEVOTHROID Take 200 mcg by mouth daily before breakfast.  DISCHARGE INSTRUCTIONS:   1. PCP f/u in 1-2 weeks  DIET:   Cardiac diet  ACTIVITY:   Activity as tolerated  OXYGEN:   Home Oxygen: No.  Oxygen Delivery: room air  DISCHARGE LOCATION:   home   If you experience worsening of your admission symptoms, develop shortness of breath, life threatening  emergency, suicidal or homicidal thoughts you must seek medical attention immediately by calling 911 or calling your MD immediately  if symptoms less severe.  You Must read complete instructions/literature along with all the possible adverse reactions/side effects for all the Medicines you take and that have been prescribed to you. Take any new Medicines after you have completely understood and accpet all the possible adverse reactions/side effects.   Please note  You were cared for by a hospitalist during your hospital stay. If you have any questions about your discharge medications or the care you received while you were in the hospital after you are discharged, you can call the unit and asked to speak with the hospitalist on call if the hospitalist that took care of you is not available. Once you are discharged, your primary care physician will handle any further medical issues. Please note that NO REFILLS for any discharge medications will be authorized once you are discharged, as it is imperative that you return to your primary care physician (or establish a relationship with a primary care physician if you do not have one) for your aftercare needs so that they can reassess your need for medications and monitor your lab values.    On the day of Discharge:  VITAL SIGNS:   Blood pressure (!) 142/87, pulse 71, temperature 98.2 F (36.8 C), temperature source Oral, resp. rate 18, height 5\' 2"  (1.575 m), weight 76.3 kg (168 lb 1.8 oz), last menstrual period 06/17/2016, SpO2 97 %.  PHYSICAL EXAMINATION:    GENERAL:  45 y.o.-year-old patient lying in the bed with no acute distress.  EYES: Pupils equal, round, reactive to light and accommodation. No scleral icterus. Extraocular muscles intact.  HEENT: Head atraumatic, normocephalic. Oropharynx and nasopharynx clear.  NECK:  Supple, no jugular venous distention. No thyroid enlargement, no tenderness.  LUNGS: Normal breath sounds bilaterally, no  wheezing, rales,rhonchi or crepitation except right basilar rhonchi. No use of accessory muscles of respiration.  CARDIOVASCULAR: S1, S2 normal. No murmurs, rubs, or gallops.  ABDOMEN: Soft, nontender, nondistended. Bowel sounds present. No organomegaly or mass.  EXTREMITIES: No pedal edema, cyanosis, or clubbing.  NEUROLOGIC: Cranial nerves II through XII are intact. Muscle strength 5/5 in all extremities. Sensation intact. Gait not checked.  PSYCHIATRIC: The patient is alert and oriented x 3.  SKIN: No obvious rash, lesion, or ulcer.    DATA REVIEW:   CBC  Recent Labs Lab 07/11/16 0456  WBC 13.8*  HGB 12.8  HCT 37.9  PLT 288    Chemistries   Recent Labs Lab 07/08/16 2004  07/11/16 0456  NA 132*  < > 137  K 2.9*  < > 3.3*  CL 101  < > 112*  CO2 22  < > 20*  GLUCOSE 118*  < > 92  BUN 10  < > 11  CREATININE 0.99  < > 0.83  CALCIUM 8.9  < > 8.6*  AST 39  --   --   ALT 36  --   --   ALKPHOS 165*  --   --   BILITOT 1.1  --   --   < > = values  in this interval not displayed.   Microbiology Results  Results for orders placed or performed during the hospital encounter of 07/08/16  Urine culture     Status: Abnormal   Collection Time: 07/08/16  8:04 PM  Result Value Ref Range Status   Specimen Description URINE, RANDOM  Final   Special Requests NONE  Final   Culture MULTIPLE SPECIES PRESENT, SUGGEST RECOLLECTION (A)  Final   Report Status 07/10/2016 FINAL  Final  Blood Culture (routine x 2)     Status: None (Preliminary result)   Collection Time: 07/08/16 11:03 PM  Result Value Ref Range Status   Specimen Description BLOOD RIGHT ASSIST CONTROL  Final   Special Requests BOTTLES DRAWN AEROBIC AND ANAEROBIC 15CC  Final   Culture NO GROWTH 2 DAYS  Final   Report Status PENDING  Incomplete  Blood Culture (routine x 2)     Status: None (Preliminary result)   Collection Time: 07/08/16 11:03 PM  Result Value Ref Range Status   Specimen Description BLOOD LEFT ASSIST  CONTROL  Final   Special Requests BOTTLES DRAWN AEROBIC AND ANAEROBIC 12CC  Final   Culture NO GROWTH 2 DAYS  Final   Report Status PENDING  Incomplete  C difficile quick scan w PCR reflex     Status: None   Collection Time: 07/10/16  4:43 PM  Result Value Ref Range Status   C Diff antigen NEGATIVE NEGATIVE Final   C Diff toxin NEGATIVE NEGATIVE Final   C Diff interpretation No C. difficile detected.  Final    RADIOLOGY:  Dg Chest 2 View  Result Date: 07/10/2016 CLINICAL DATA:  Pneumonia EXAM: CHEST  2 VIEW COMPARISON:  07/08/2016 FINDINGS: Right upper lobe consolidation/pneumonia. Additional mild patchy right lower lobe opacity. Mild left lower lobe opacity. No pleural effusion or pneumothorax. The heart is normal in size. Degenerative changes of the visualized thoracolumbar spine. Cholecystectomy clips. IMPRESSION: Multifocal pneumonia, right upper lobe predominant, with increasing bilateral lower lobe opacity. Electronically Signed   By: Charline BillsSriyesh  Krishnan M.D.   On: 07/10/2016 10:02     Management plans discussed with the patient, family and they are in agreement.  CODE STATUS:     Code Status Orders        Start     Ordered   07/09/16 0240  Full code  Continuous     07/09/16 0239    Code Status History    Date Active Date Inactive Code Status Order ID Comments User Context   This patient has a current code status but no historical code status.      TOTAL TIME TAKING CARE OF THIS PATIENT: 37 minutes.    Garlan Drewes M.D on 07/11/2016 at 8:30 AM  Between 7am to 6pm - Pager - [phone]  After 6pm go to [email or web address] - Scientist, research (life sciences)password [email or web address]  [facility] [place] [facility]  Office  [phone]  CC: Primary care physician; No PCP Per Patient   Note: This dictation was prepared with Dragon dictation along with smaller phrase technology. Any transcriptional errors that result from this process are unintentional.

## 2016-07-11 NOTE — Progress Notes (Signed)
Discussed discharge instructions and medications with pt. IV removed. All questions addressed. Pt transported home via car by her mother. 

## 2016-07-13 LAB — CULTURE, BLOOD (ROUTINE X 2)
CULTURE: NO GROWTH
Culture: NO GROWTH

## 2016-10-24 ENCOUNTER — Emergency Department
Admission: EM | Admit: 2016-10-24 | Discharge: 2016-10-24 | Disposition: A | Discharge status: Home / Self Care | Payer: Self-pay | Attending: Emergency Medicine | Admitting: Emergency Medicine

## 2016-10-24 ENCOUNTER — Emergency Department: Payer: Self-pay

## 2016-10-24 ENCOUNTER — Encounter: Payer: Self-pay | Admitting: Emergency Medicine

## 2016-10-24 DIAGNOSIS — E039 Hypothyroidism, unspecified: Secondary | ICD-10-CM | POA: Insufficient documentation

## 2016-10-24 DIAGNOSIS — J069 Acute upper respiratory infection, unspecified: Secondary | ICD-10-CM | POA: Insufficient documentation

## 2016-10-24 DIAGNOSIS — I10 Essential (primary) hypertension: Secondary | ICD-10-CM | POA: Insufficient documentation

## 2016-10-24 DIAGNOSIS — F17200 Nicotine dependence, unspecified, uncomplicated: Secondary | ICD-10-CM | POA: Insufficient documentation

## 2016-10-24 DIAGNOSIS — Z79899 Other long term (current) drug therapy: Secondary | ICD-10-CM | POA: Insufficient documentation

## 2016-10-24 DIAGNOSIS — J449 Chronic obstructive pulmonary disease, unspecified: Secondary | ICD-10-CM | POA: Insufficient documentation

## 2016-10-24 MED ORDER — ALBUTEROL SULFATE HFA 108 (90 BASE) MCG/ACT IN AERS: 2.0000 | INHALATION_SPRAY | Freq: Four times a day (QID) | RESPIRATORY_TRACT | 0 refills | Status: DC | PRN

## 2016-10-24 NOTE — Discharge Instructions (Signed)
Follow-up with one of the clinics listed on your discharge papers. Call today for an appointment which may be several weeks from now. Have your blood pressure rechecked there for evaluation of her elevated blood pressure today. Pro-air inhaler 2 puffs 4 times a day. Discontinue smoking. Increase fluids. Tylenol or ibuprofen if needed for fever, headache, or body aches.

## 2016-10-24 NOTE — ED Provider Notes (Signed)
[facility] Emergency Department Provider Note  ____________________________________________   First MD Initiated Contact with Patient 10/24/16 1207     (approximate)  I have reviewed the triage vital signs and the nursing notes.   HISTORY  Chief Complaint Cough   HPI Dawn Kane is a 46 y.o. female is here with complaint of nonproductive cough for one week. Patient states that she has had a history of pneumonia and is worried about this. Patient continues to smoke but states that smoking less because she feels like her breathing is not "normal". Patient states that she has a fever that is "often". She also complains of fatigue, body aches, and sore throat. Patient states that occasionally her throat feels like it is swollen. Patient also complains of a swelling feeling to the right side of her face. Patient did take over-the-counter antipyretics prior to arrival in the emergency room. Patient also states that she is supposed to be on blood pressure medication but has not taken it for many months. Currently she rates her discomfort as 6 out of 10.   Past Medical History:  Diagnosis Date  . Anxiety   . COPD (chronic obstructive pulmonary disease) (HCC)   . GERD (gastroesophageal reflux disease)   . Hyperlipidemia   . Hypertension   . Hypothyroidism   . Obesity     Patient Active Problem List   Diagnosis Date Noted  . Sepsis (HCC) 07/09/2016  . CAP (community acquired pneumonia) 07/09/2016  . Anxiety 07/09/2016  . Hypothyroidism 07/09/2016    Past Surgical History:  Procedure Laterality Date  . CLAVICLE SURGERY    . LUNG SURGERY    . MANDIBLE SURGERY      Prior to Admission medications   Medication Sig Start Date End Date Taking? Authorizing Provider  acidophilus (RISAQUAD) CAPS capsule Take 1 capsule by mouth 2 (two) times daily. X 5 days while on antibiotics 07/11/16   Enid Baasadhika Kalisetti, MD  albuterol (PROVENTIL HFA;VENTOLIN HFA) 108 (90  Base) MCG/ACT inhaler Inhale 2 puffs into the lungs every 6 (six) hours as needed for wheezing or shortness of breath. 10/24/16   Tommi Rumpshonda L Aveline Daus, PA-C  ALPRAZolam Prudy Feeler(XANAX) 0.5 MG tablet Take 0.5 mg by mouth at bedtime as needed for anxiety.    Historical Provider, MD  cyclobenzaprine (FEXMID) 7.5 MG tablet Take 1 tablet (7.5 mg total) by mouth 3 (three) times daily. 07/11/16   Enid Baasadhika Kalisetti, MD  levothyroxine (SYNTHROID, LEVOTHROID) 200 MCG tablet Take 200 mcg by mouth daily before breakfast.     Historical Provider, MD    Allergies Amoxicillin and Aspirin  Family History  Problem Relation Age of Onset  . Heart disease Paternal Grandmother   . Cancer Paternal Grandfather     pancreatic    Social History Social History  Substance Use Topics  . Smoking status: Current Every Day Smoker  . Smokeless tobacco: Never Used  . Alcohol use Yes     Comment: social    Review of Systems Constitutional: Positive fever/chills Eyes: No visual changes. ENT: Positive sore throat. Cardiovascular: Denies chest pain. Respiratory: Denies shortness of breath. Positive cough. Gastrointestinal: No abdominal pain.  No nausea, no vomiting.  No diarrhea.   Musculoskeletal: Negative for back pain. Skin: Negative for rash. Neurological: Negative for headaches, focal weakness or numbness.  10-point ROS otherwise negative.  ____________________________________________   PHYSICAL EXAM:  VITAL SIGNS: ED Triage Vitals [10/24/16 1122]  Enc Vitals Group     BP (!) 157/108  Pulse Rate 86     Resp 20     Temp 98.6 F (37 C)     Temp Source Oral     SpO2 96 %     Weight 160 lb (72.6 kg)     Height 5\' 2"  (1.575 m)     Head Circumference      Peak Flow      Pain Score 6     Pain Loc      Pain Edu?      Excl. in GC?     Constitutional: Alert and oriented. Well appearing and in no acute distress. Eyes: Conjunctivae are normal. PERRL. EOMI. Head: Atraumatic. Nose: Mild  congestion/rhinnorhea.   EACs are clear bilaterally. TMs are dull bilaterally, no erythema or injection was seen. Mouth/Throat: Mucous membranes are moist.  Oropharynx non-erythematous. Neck: No stridor.   Hematological/Lymphatic/Immunilogical: No cervical lymphadenopathy. Cardiovascular: Normal rate, regular rhythm. Grossly normal heart sounds.  Good peripheral circulation. Respiratory: Normal respiratory effort.  No retractions. Lungs CTAB. Gastrointestinal: Soft and nontender. No distention. Musculoskeletal: Moves upper and lower extremities without any difficulty. Normal gait was noted. Neurologic:  Normal speech and language. No gross focal neurologic deficits are appreciated. No gait instability. Skin:  Skin is warm, dry and intact. No rash noted. Psychiatric: Mood and affect are normal. Speech and behavior are normal.  ____________________________________________   LABS (all labs ordered are listed, but only abnormal results are displayed)  Labs Reviewed - No data to display   RADIOLOGY Chest x-ray per radiologist: IMPRESSION:  Mild right basilar subsegmental atelectasis.       ____________________________________________   PROCEDURES  Procedure(s) performed: None  Procedures  Critical Care performed: No  ____________________________________________   INITIAL IMPRESSION / ASSESSMENT AND PLAN / ED COURSE  Pertinent labs & imaging results that were available during my care of the patient were reviewed by me and considered in my medical decision making (see chart for details).    Clinical Course    Patient was reassured that her chest x-ray does not show any signs of pneumonia. Patient was encouraged to follow-up for recheck of her blood pressure at one of the clinics listed on her discharge papers. Patient was given a prescription for pro-air 2 puffs 4 times a day, increase fluids, Tylenol or ibuprofen as needed for fever or body aches.  Patient is also  encouraged to discontinue smoking.  ____________________________________________   FINAL CLINICAL IMPRESSION(S) / ED DIAGNOSES  Final diagnoses:  Acute upper respiratory infection      NEW MEDICATIONS STARTED DURING THIS VISIT:  Discharge Medication List as of 10/24/2016  1:56 PM       Note:  This document was prepared using Dragon voice recognition software and may include unintentional dictation errors.    Tommi Rumps, PA-C 10/24/16 1600    Sharman Cheek, MD 10/29/16 [phone]

## 2016-10-24 NOTE — ED Notes (Addendum)
Says sx for 1-2 weeks , but also says she still has sx from a month or 2 ago when she was here for pneumonia.  Says fever on and off, right side face swelling and numbness--wakes up with that.  Throat swelling.  Fatigue.  Says no pcp, but does not have medicaid any more.

## 2016-10-24 NOTE — ED Triage Notes (Signed)
Pt reports dry, nonproductive cough x1 week, pt is smoker.

## 2017-05-15 ENCOUNTER — Emergency Department
Admission: EM | Admit: 2017-05-15 | Discharge: 2017-05-15 | Discharge status: Against Medical Advice | Payer: Medicaid Other

## 2017-08-01 ENCOUNTER — Ambulatory Visit (INDEPENDENT_AMBULATORY_CARE_PROVIDER_SITE_OTHER): Payer: Self-pay | Admitting: Internal Medicine

## 2017-08-01 ENCOUNTER — Encounter: Payer: Self-pay | Admitting: Internal Medicine

## 2017-08-01 DIAGNOSIS — B182 Chronic viral hepatitis C: Secondary | ICD-10-CM | POA: Insufficient documentation

## 2017-08-01 NOTE — Patient Instructions (Addendum)
Date 08/01/17  Dear Ms Dawn Kane, As discussed in the ID Clinic, your hepatitis C therapy will include the following medications:          Harvoni /400mg  tablet:           Take 1 tablet by mouth once daily   Please note that ALL MEDICATIONS WILL START ON THE SAME DATE for a total of 12 weeks.  Please call us when you have the assistance program approved and we will schedule labs and ultrasound with elastography ---------------------------------------------------------------- Your HCV Treatment Start Date: TBA   Your HCV genotype:  1a    Liver Fibrosis: TBD    ---------------------------------------------------------------- YOUR PHARMACY CONTACT:   [facility] [address] [place], [place] [address] Phone: [phone] Hours: Monday to Friday 7:30 am to 6:00 pm   Please always contact your pharmacy at least 3-4 business days before you run out of medications to ensure your next month's medication is ready or 1 week prior to running out if you receive it by mail.  Remember, each prescription is for 28 days. ---------------------------------------------------------------- GENERAL NOTES REGARDING YOUR HEPATITIS C MEDICATION:  SOFOSBUVIR/LEDIPASVIR (HARVONI): - Harvoni tablet is taken daily with OR without food. - The tablets are orange. - The tablets should be stored at room temperature.  - Acid reducing agents such as H2 blockers (ie. Pepcid (famotidine), Zantac (ranitidine), Tagamet (cimetidine), Axid (nizatidine) and proton pump inhibitors (ie. Prilosec (omeprazole), Protonix (pantoprazole), Nexium (esomeprazole), or Aciphex (rabeprazole)) can decrease effectiveness of Harvoni. Do not take until you have discussed with a health care provider.    -Antacids that contain magnesium and/or aluminum hydroxide (ie. Milk of Magensia, Rolaids, Gaviscon, Maalox, Mylanta, an dArthritis Pain Formula)can reduce absorption of Harvoni, so take them at least 4 hours before  or after Harvoni.  -Calcium carbonate (calcium supplements or antacids such as Tums, Caltrate, Os-Cal)needs to be taken at least 4 hours hours before or after Harvoni.  -St. John's wort or any products that contain St. John's wort like some herbal supplements  Please inform the office prior to starting any of these medications.  - The common side effects associated with Harvoni include:      1. Fatigue      2. Headache      3. Nausea      4. Diarrhea      5. Insomnia  Please note that this only lists the most common side effects and is NOT a comprehensive list of the potential side effects of these medications. For more information, please review the drug information sheets that come with your medication package from the pharmacy.  ---------------------------------------------------------------- GENERAL HELPFUL HINTS ON HCV THERAPY: 1. Stay well-hydrated. 2. Notify the ID Clinic of any changes in your other over-the-counter/herbal or prescription medications. 3. If you miss a dose of your medication, take the missed dose as soon as you remember. Return to your regular time/dose schedule the next day.  4.  Do not stop taking your medications without first talking with your healthcare provider. 5.  You may take Tylenol (acetaminophen), as long as the dose is less than 2000 mg (OR no more than 4 tablets of the Tylenol Extra Strengths  tablet) in 24 hours. 6.  You will see our pharmacist-specialist within the first 2 weeks of starting your medication to monitor for any possible side effects. 7.  You will have labs once during treatment, after soon after treatment completion and one final lab 6 months after treatment completion to verify  the virus is out of your system.  Scharlene Gloss, [place] [facility] [place] [address] [place], [place]  [phone]

## 2017-08-01 NOTE — Progress Notes (Signed)
[facility]   CC: consideration for treatment for chronic hepatitis C  HPI:  +Dawn Kane is a 46 y.o. female who presents for initial evaluation and management of chronic hepatitis C.  Patient tested positive last year. Hepatitis C-associated risk factors present are: none. Patient denies intranasal drug use, IV drug abuse. Patient has had other studies performed. Results: hepatitis C RNA by PCR, result: positive, and genotype 1a. Patient has not had prior treatment for Hepatitis C. Patient does not have a past history of liver disease. Patient does not have a family history of liver disease. Patient does not  have associated signs or symptoms related to liver disease.  Labs reviewed and confirm chronic hepatitis C with a positive viral load.   Records reviewed from PCP and genotype done as above.        Patient does not have documented immunity to Hepatitis A. Patient does not have documented immunity to Hepatitis B.    Review of Systems:  Constitutional: negative for fatigue and malaise Gastrointestinal: negative for diarrhea Integument/breast: negative for rash All other systems reviewed and are negative       Past Medical History:  Diagnosis Date  . Anxiety   . COPD (chronic obstructive pulmonary disease) (HCC)   . GERD (gastroesophageal reflux disease)   . Hyperlipidemia   . Hypertension   . Hypothyroidism   . Obesity     Prior to Admission medications   Medication Sig Start Date End Date Taking? Authorizing Provider  gemfibrozil (LOPID) 600 MG tablet TAKE 1 TABLET (600 MG) BY ORAL ROUTE 2 TIMES PER DAY 30 MINUTES BEFORE MORNING AND EVENING MEAL 07/19/17  Yes [provider]  omeprazole (PRILOSEC) 20 MG capsule Take by mouth.   Yes [provider]  acidophilus (RISAQUAD) CAPS capsule Take 1 capsule by mouth 2 (two) times daily. X 5 days while on antibiotics Patient not taking: Reported on 08/01/2017 07/11/16   Enid Baas, MD  albuterol (PROVENTIL HFA;VENTOLIN HFA) 108 (90 Base) MCG/ACT inhaler Inhale 2 puffs into the lungs every 6 (six) hours as needed for wheezing or shortness of breath. Patient not taking: Reported on 08/01/2017 10/24/16   Tommi Rumps, PA-C  ALPRAZolam Prudy Feeler) 0.5 MG tablet Take 0.5 mg by mouth at bedtime as needed for anxiety.    [provider]  cyclobenzaprine (FEXMID) 7.5 MG tablet Take 1 tablet (7.5 mg total) by mouth 3 (three) times daily. Patient not taking: Reported on 08/01/2017 07/11/16   Enid Baas, MD  levothyroxine (SYNTHROID, LEVOTHROID) 200 MCG tablet Take 200 mcg by mouth daily before breakfast.     [provider]    Allergies  Allergen Reactions  . Amoxicillin Nausea And Vomiting    Has patient had a PCN reaction causing immediate rash, facial/tongue/throat swelling, SOB or lightheadedness with hypotension: No Has patient had a PCN reaction causing severe rash involving mucus membranes or skin necrosis: No Has patient had a PCN reaction that required hospitalization No Has patient had a PCN reaction occurring within the last 10 years: No If all of the above answers are "NO", then may proceed with Cephalosporin use.   . Aspirin Nausea And Vomiting    Social History  Substance Use Topics  . Smoking status: Current Every Day Smoker    Packs/day: 1.00    Types: Cigarettes  . Smokeless tobacco: Never Used  . Alcohol use No     Comment: quit a year ago  Family History  Problem Relation Age of Onset  . Heart disease Paternal Grandmother   . Cancer Paternal Grandfather        pancreatic  no cirrhosis   Objective:  Constitutional: in no apparent distress,  Vitals:   08/01/17 1112  BP: 113/74  Pulse: 76  Temp: 98 F (36.7 C)   Eyes: anicteric Cardiovascular: Cor RRR Respiratory: CTA B; normal respiratory effort Gastrointestinal: Bowel sounds are normal, liver is not enlarged, spleen is not enlarged Musculoskeletal: no  pedal edema noted Skin: negatives: no rash; no porphyria cutanea tarda Lymphatic: no cervical lymphadenopathy   Laboratory Genotype: No results found for: HCVGENOTYPE HCV viral load: No results found for: HCVQUANT Lab Results  Component Value Date   WBC 13.8 (H) 07/11/2016   HGB 12.8 07/11/2016   HCT 37.9 07/11/2016   MCV 91.6 07/11/2016   PLT 288 07/11/2016    Lab Results  Component Value Date   CREATININE 0.83 07/11/2016   BUN 11 07/11/2016   NA 137 07/11/2016   K 3.3 (L) 07/11/2016   CL 112 (H) 07/11/2016   CO2 20 (L) 07/11/2016    Lab Results  Component Value Date   ALT 36 07/08/2016   AST 39 07/08/2016   ALKPHOS 165 (H) 07/08/2016     Labs and history reviewed and show CHILD-PUGH unknown  5-6 points: Child class A 7-9 points: Child class B 10-15 points: Child class C  Lab Results  Component Value Date   BILITOT 1.1 07/08/2016   ALBUMIN 3.3 (L) 07/08/2016     Assessment: New Patient with Chronic Hepatitis C genotype unknown, untreated.  I discussed with the patient the lab findings that confirm chronic hepatitis C as well as the natural history and progression of disease including about 30% of people who develop cirrhosis of the liver if left untreated and once cirrhosis is established there is a 2-7% risk per year of liver cancer and liver failure.  I discussed the importance of treatment and benefits in reducing the risk, even if significant liver fibrosis exists.   Plan: 1) Patient counseled extensively on limiting acetaminophen to no more than 2 grams daily, avoidance of alcohol. 2) Transmission discussed with patient including sexual transmission, sharing razors and toothbrush.   3) Will need referral to gastroenterology if concern for cirrhosis 4) Will need referral for substance abuse counseling: No.; Further work up to include urine drug screen  No. 5) Will prescribe Harvoni for genotype 1a. She will apply for [facility] Financial Assistance first and then  come back for labs and ultrasound with elasotgraphy when she has the assistance program approved.  6) Hepatitis A and B titers 7) Pneumovax vaccine if not previously given 9) Further work up to include liver staging with elastography 10) will follow up after starting medication

## 2017-08-15 ENCOUNTER — Ambulatory Visit (HOSPITAL_COMMUNITY)
Admission: RE | Admit: 2017-08-15 | Discharge: 2017-08-15 | Disposition: A | Discharge status: Home / Self Care | Payer: Self-pay | Source: Ambulatory Visit | Attending: Internal Medicine | Admitting: Internal Medicine

## 2017-08-15 DIAGNOSIS — Z9049 Acquired absence of other specified parts of digestive tract: Secondary | ICD-10-CM | POA: Insufficient documentation

## 2017-08-15 DIAGNOSIS — B182 Chronic viral hepatitis C: Secondary | ICD-10-CM | POA: Insufficient documentation

## 2017-08-22 ENCOUNTER — Telehealth: Payer: Self-pay | Admitting: *Deleted

## 2017-08-22 NOTE — Telephone Encounter (Signed)
No cirrhosis though the score if F3/4 so significant scarring.  Getting rid of the hepatitis C should keep the liver stable going forward.  Will discuss this more at her visit with me or pharmD.  thanks

## 2017-08-22 NOTE — Telephone Encounter (Signed)
Patient called for results of her ultrasound elastography.

## 2017-08-22 NOTE — Telephone Encounter (Signed)
Patient notified Dawn Kane  

## 2017-08-22 NOTE — Telephone Encounter (Signed)
Patient has applied for financial assistance through [facility] to get the Hep C labs that she needs to proceed with treatment. Deirdre EvenerKim Epperson has reached out to her and she is still considering the Hep C Research trial.

## 2017-12-04 ENCOUNTER — Ambulatory Visit (HOSPITAL_COMMUNITY)
Admission: EM | Admit: 2017-12-04 | Discharge: 2017-12-04 | Disposition: A | Discharge status: Home / Self Care | Payer: Self-pay | Attending: Family Medicine | Admitting: Family Medicine

## 2017-12-04 ENCOUNTER — Encounter (HOSPITAL_COMMUNITY): Payer: Self-pay | Admitting: Emergency Medicine

## 2017-12-04 DIAGNOSIS — M65311 Trigger thumb, right thumb: Secondary | ICD-10-CM

## 2017-12-04 MED ORDER — PREDNISONE 20 MG PO TABS: ORAL_TABLET | ORAL | 0 refills | Status: DC

## 2017-12-04 NOTE — ED Provider Notes (Signed)
[facility]   [id number] 12/04/17 Arrival Time: 1415   SUBJECTIVE:  Dawn Kane is a 47 y.o. female who presents to the urgent care with complaint of severe pain in right thumb for months.  The distal phalanx snaps when bending or flexing  Works at [employer].   Past Medical History:  Diagnosis Date  . Anxiety   . COPD (chronic obstructive pulmonary disease) (HCC)   . GERD (gastroesophageal reflux disease)   . Hyperlipidemia   . Hypertension   . Hypothyroidism   . Obesity    Family History  Problem Relation Age of Onset  . Heart disease Paternal Grandmother   . Cancer Paternal Grandfather        pancreatic   Social History   Socioeconomic History  . Marital status: Single    Spouse name: Not on file  . Number of children: Not on file  . Years of education: Not on file  . Highest education level: Not on file  Social Needs  . Financial resource strain: Not on file  . Food insecurity - worry: Not on file  . Food insecurity - inability: Not on file  . Transportation needs - medical: Not on file  . Transportation needs - non-medical: Not on file  Occupational History  . Not on file  Tobacco Use  . Smoking status: Current Every Day Smoker    Packs/day: 1.00    Types: Cigarettes  . Smokeless tobacco: Never Used  Substance and Sexual Activity  . Alcohol use: No    Comment: quit a year ago  . Drug use: Yes    Types: Marijuana  . Sexual activity: Yes    Partners: Male    Birth control/protection: Surgical  Other Topics Concern  . Not on file  Social History Narrative  . Not on file   Current Meds  Medication Sig  . gemfibrozil (LOPID) 600 MG tablet TAKE 1 TABLET (600 MG) BY ORAL ROUTE 2 TIMES PER DAY 30 MINUTES BEFORE MORNING AND EVENING MEAL  . levothyroxine (SYNTHROID, LEVOTHROID) 200 MCG tablet Take 200 mcg by mouth daily before breakfast.   . lisinopril-hydrochlorothiazide (PRINZIDE,ZESTORETIC) 10-12.5 MG tablet Take 1 tablet by mouth daily.    Allergies  Allergen Reactions  . Amoxicillin Nausea And Vomiting    Has patient had a PCN reaction causing immediate rash, facial/tongue/throat swelling, SOB or lightheadedness with hypotension: No Has patient had a PCN reaction causing severe rash involving mucus membranes or skin necrosis: No Has patient had a PCN reaction that required hospitalization No Has patient had a PCN reaction occurring within the last 10 years: No If all of the above answers are "NO", then may proceed with Cephalosporin use.   . Aspirin Nausea And Vomiting      ROS: As per HPI, remainder of ROS negative.   OBJECTIVE:   Vitals:   12/04/17 1531 12/04/17 1532 12/04/17 1533  BP:  103/63   Pulse: 67    Resp: 16    Temp: 98.3 F (36.8 C)    TempSrc: Oral    SpO2: 99%    Weight:  160 lb (72.6 kg) 153 lb (69.4 kg)     General appearance: alert; no distress Eyes: PERRL; EOMI; conjunctiva normal HENT: normocephalic; atraumatic; TMs normal, canal normal, external ears normal without trauma; nasal mucosa normal; oral mucosa normal Neck: supple Lungs: clear to auscultation bilaterally Heart: regular rate and rhythm Abdomen: soft, non-tender; bowel sounds normal; no masses or organomegaly; no guarding or rebound tenderness  Back: no CVA tenderness Extremities: no cyanosis or edema; symmetrical with no gross deformities Skin: warm and dry Neurologic: normal gait; grossly normal Psychological: alert and cooperative; normal mood and affect      Labs:  Results for orders placed or performed during the hospital encounter of 07/08/16  Blood Culture (routine x 2)  Result Value Ref Range   Specimen Description BLOOD RIGHT ASSIST CONTROL    Special Requests BOTTLES DRAWN AEROBIC AND ANAEROBIC 15CC    Culture NO GROWTH 5 DAYS    Report Status 07/13/2016 FINAL   Blood Culture (routine x 2)  Result Value Ref Range   Specimen Description BLOOD LEFT ASSIST CONTROL    Special Requests BOTTLES DRAWN  AEROBIC AND ANAEROBIC 12CC    Culture NO GROWTH 5 DAYS    Report Status 07/13/2016 FINAL   Urine culture  Result Value Ref Range   Specimen Description URINE, RANDOM    Special Requests NONE    Culture MULTIPLE SPECIES PRESENT, SUGGEST RECOLLECTION (A)    Report Status 07/10/2016 FINAL   C difficile quick scan w PCR reflex  Result Value Ref Range   C Diff antigen NEGATIVE NEGATIVE   C Diff toxin NEGATIVE NEGATIVE   C Diff interpretation No C. difficile detected.   Comprehensive metabolic panel  Result Value Ref Range   Sodium 132 (L) 135 - 145 mmol/L   Potassium 2.9 (L) 3.5 - 5.1 mmol/L   Chloride 101 101 - 111 mmol/L   CO2 22 22 - 32 mmol/L   Glucose, Bld 118 (H) 65 - 99 mg/dL   BUN 10 6 - 20 mg/dL   Creatinine, Ser 6.96 0.44 - 1.00 mg/dL   Calcium 8.9 8.9 - 29.5 mg/dL   Total Protein 8.1 6.5 - 8.1 g/dL   Albumin 3.3 (L) 3.5 - 5.0 g/dL   AST 39 15 - 41 U/L   ALT 36 14 - 54 U/L   Alkaline Phosphatase 165 (H) 38 - 126 U/L   Total Bilirubin 1.1 0.3 - 1.2 mg/dL   GFR calc non Af Amer >60 >60 mL/min   GFR calc Af Amer >60 >60 mL/min   Anion gap 9 5 - 15  CBC  Result Value Ref Range   WBC 24.7 (H) 3.6 - 11.0 K/uL   RBC 4.54 3.80 - 5.20 MIL/uL   Hemoglobin 14.1 12.0 - 16.0 g/dL   HCT 28.4 13.2 - 44.0 %   MCV 91.8 80.0 - 100.0 fL   MCH 31.1 26.0 - 34.0 pg   MCHC 33.8 32.0 - 36.0 g/dL   RDW 10.2 (H) 72.5 - 36.6 %   Platelets 299 150 - 440 K/uL  Urinalysis complete, with microscopic  Result Value Ref Range   Color, Urine AMBER (A) YELLOW   APPearance CLOUDY (A) CLEAR   Glucose, UA NEGATIVE NEGATIVE mg/dL   Bilirubin Urine NEGATIVE NEGATIVE   Ketones, ur TRACE (A) NEGATIVE mg/dL   Specific Gravity, Urine 1.015 1.005 - 1.030   Hgb urine dipstick 1+ (A) NEGATIVE   pH 6.0 5.0 - 8.0   Protein, ur 100 (A) NEGATIVE mg/dL   Nitrite NEGATIVE NEGATIVE   Leukocytes, UA TRACE (A) NEGATIVE   RBC / HPF 6-30 0 - 5 RBC/hpf   WBC, UA 0-5 0 - 5 WBC/hpf   Bacteria, UA RARE (A) NONE  SEEN   Squamous Epithelial / LPF 6-30 (A) NONE SEEN   Mucus PRESENT    Hyaline Casts, UA PRESENT   Lactic acid, plasma  Result Value Ref Range   Lactic Acid, Venous 1.0 0.5 - 1.9 mmol/L  Basic metabolic panel  Result Value Ref Range   Sodium 134 (L) 135 - 145 mmol/L   Potassium 2.6 (LL) 3.5 - 5.1 mmol/L   Chloride 107 101 - 111 mmol/L   CO2 19 (L) 22 - 32 mmol/L   Glucose, Bld 128 (H) 65 - 99 mg/dL   BUN 9 6 - 20 mg/dL   Creatinine, Ser 1.61 0.44 - 1.00 mg/dL   Calcium 7.8 (L) 8.9 - 10.3 mg/dL   GFR calc non Af Amer >60 >60 mL/min   GFR calc Af Amer >60 >60 mL/min   Anion gap 8 5 - 15  CBC  Result Value Ref Range   WBC 21.6 (H) 3.6 - 11.0 K/uL   RBC 3.94 3.80 - 5.20 MIL/uL   Hemoglobin 12.6 12.0 - 16.0 g/dL   HCT 09.6 04.5 - 40.9 %   MCV 90.9 80.0 - 100.0 fL   MCH 32.0 26.0 - 34.0 pg   MCHC 35.2 32.0 - 36.0 g/dL   RDW 81.1 (H) 91.4 - 78.2 %   Platelets 249 150 - 440 K/uL  Potassium  Result Value Ref Range   Potassium 2.7 (LL) 3.5 - 5.1 mmol/L  Basic metabolic panel  Result Value Ref Range   Sodium 134 (L) 135 - 145 mmol/L   Potassium 2.8 (L) 3.5 - 5.1 mmol/L   Chloride 109 101 - 111 mmol/L   CO2 19 (L) 22 - 32 mmol/L   Glucose, Bld 97 65 - 99 mg/dL   BUN 11 6 - 20 mg/dL   Creatinine, Ser 9.56 0.44 - 1.00 mg/dL   Calcium 8.1 (L) 8.9 - 10.3 mg/dL   GFR calc non Af Amer >60 >60 mL/min   GFR calc Af Amer >60 >60 mL/min   Anion gap 6 5 - 15  CBC  Result Value Ref Range   WBC 17.2 (H) 3.6 - 11.0 K/uL   RBC 3.87 3.80 - 5.20 MIL/uL   Hemoglobin 12.3 12.0 - 16.0 g/dL   HCT 21.3 08.6 - 57.8 %   MCV 90.7 80.0 - 100.0 fL   MCH 31.7 26.0 - 34.0 pg   MCHC 34.9 32.0 - 36.0 g/dL   RDW 46.9 (H) 62.9 - 52.8 %   Platelets 272 150 - 440 K/uL  CBC  Result Value Ref Range   WBC 13.8 (H) 3.6 - 11.0 K/uL   RBC 4.13 3.80 - 5.20 MIL/uL   Hemoglobin 12.8 12.0 - 16.0 g/dL   HCT 41.3 24.4 - 01.0 %   MCV 91.6 80.0 - 100.0 fL   MCH 31.0 26.0 - 34.0 pg   MCHC 33.8 32.0 - 36.0 g/dL    RDW 27.2 (H) 53.6 - 14.5 %   Platelets 288 150 - 440 K/uL  Basic metabolic panel  Result Value Ref Range   Sodium 137 135 - 145 mmol/L   Potassium 3.3 (L) 3.5 - 5.1 mmol/L   Chloride 112 (H) 101 - 111 mmol/L   CO2 20 (L) 22 - 32 mmol/L   Glucose, Bld 92 65 - 99 mg/dL   BUN 11 6 - 20 mg/dL   Creatinine, Ser 6.44 0.44 - 1.00 mg/dL   Calcium 8.6 (L) 8.9 - 10.3 mg/dL   GFR calc non Af Amer >60 >60 mL/min   GFR calc Af Amer >60 >60 mL/min   Anion gap 5 5 - 15  Pregnancy, urine POC  Result Value  Ref Range   Preg Test, Ur NEGATIVE NEGATIVE    Labs Reviewed - No data to display  No results found.     ASSESSMENT & PLAN:  1. Trigger finger of right thumb     Meds ordered this encounter  Medications  . predniSONE (DELTASONE) 20 MG tablet    Sig: Two daily with food    Dispense:  10 tablet    Refill:  0    Reviewed expectations re: course of current medical issues. Questions answered. Outlined signs and symptoms indicating need for more acute intervention. Patient verbalized understanding. After Visit Summary given.    Procedures:      Elvina Sidle, MD 12/04/17 1559

## 2017-12-04 NOTE — Discharge Instructions (Signed)
This is a tendonitis and may respond to the prednisone.  If not, call the orthopedist for further treatment

## 2017-12-04 NOTE — ED Triage Notes (Signed)
PT reports severe pain in right thumb for months

## 2018-03-13 ENCOUNTER — Other Ambulatory Visit: Payer: Self-pay | Admitting: Pharmacist Clinician (PhC)/ Clinical Pharmacy Specialist

## 2018-03-13 DIAGNOSIS — B182 Chronic viral hepatitis C: Secondary | ICD-10-CM

## 2018-03-13 NOTE — Progress Notes (Signed)
Dawn Kane walked in today and stopped by [name] to ask about her hep C process. She was seen by Dr. Luciana Axe in Oct 2018 before he elastography. She was given the [facility] financial assistance form to fill out but that process has a lot of issue so she never heard back. According to Dr. Ephriam Knuckles note, she has 1a with a positive RNA. [name]. We will try to see if they have any recent labs on her. We still need to get more labs for assess her treatment here. Gave her the form again to take to [name] and [facility] financial assistance form.   She denied a hx of IVDA but did admitted to snorting. She will call back once she hears about [facility] approval and schedule an appt with [place]. She does take omeprazole and may not be able to stay off of it for 3 months. We may have to use [name] x 12 wks through patient assistance.

## 2018-03-14 ENCOUNTER — Other Ambulatory Visit: Payer: Self-pay | Admitting: Pharmacist Clinician (PhC)/ Clinical Pharmacy Specialist

## 2018-03-14 MED ORDER — GLECAPREVIR-PIBRENTASVIR 100-40 MG PO TABS: 3.0000 | ORAL_TABLET | Freq: Every day | ORAL | 2 refills | Status: DC

## 2018-03-14 NOTE — Progress Notes (Signed)
We start the process for [name] x 12 wks for the uninsured.

## 2018-03-20 ENCOUNTER — Other Ambulatory Visit: Payer: Self-pay

## 2018-03-20 DIAGNOSIS — B182 Chronic viral hepatitis C: Secondary | ICD-10-CM

## 2018-03-21 ENCOUNTER — Other Ambulatory Visit: Payer: Self-pay | Admitting: Nurse Practitioner

## 2018-03-21 DIAGNOSIS — Z1231 Encounter for screening mammogram for malignant neoplasm of breast: Secondary | ICD-10-CM

## 2018-03-21 LAB — COMPLETE METABOLIC PANEL WITH GFR
AG Ratio: 1.4 (calc) (ref 1.0–2.5)
ALBUMIN MSPROF: 4.4 g/dL (ref 3.6–5.1)
ALT: 5 U/L — ABNORMAL LOW (ref 6–29)
AST: 8 U/L — AB (ref 10–35)
Alkaline phosphatase (APISO): 82 U/L (ref 33–115)
BUN/Creatinine Ratio: 24 (calc) — ABNORMAL HIGH (ref 6–22)
BUN: 31 mg/dL — ABNORMAL HIGH (ref 7–25)
CALCIUM: 10.4 mg/dL — AB (ref 8.6–10.2)
CO2: 21 mmol/L (ref 20–32)
CREATININE: 1.31 mg/dL — AB (ref 0.50–1.10)
Chloride: 111 mmol/L — ABNORMAL HIGH (ref 98–110)
GFR, EST NON AFRICAN AMERICAN: 48 mL/min/{1.73_m2} — AB (ref >=60)
GFR, Est African American: 56 mL/min/{1.73_m2} — ABNORMAL LOW (ref >=60)
GLOBULIN: 3.2 g/dL (ref 1.9–3.7)
Glucose, Bld: 92 mg/dL (ref 65–99)
Potassium: 4.2 mmol/L (ref 3.5–5.3)
SODIUM: 136 mmol/L (ref 135–146)
Total Bilirubin: 0.2 mg/dL (ref 0.2–1.2)
Total Protein: 7.6 g/dL (ref 6.1–8.1)

## 2018-03-21 LAB — CBC
HCT: 34.1 % — ABNORMAL LOW (ref 35.0–45.0)
HEMOGLOBIN: 11.4 g/dL — AB (ref 11.7–15.5)
MCH: 28.1 pg (ref 27.0–33.0)
MCHC: 33.4 g/dL (ref 32.0–36.0)
MCV: 84 fL (ref 80.0–100.0)
MPV: 10.9 fL (ref 7.5–12.5)
Platelets: 410 10*3/uL — ABNORMAL HIGH (ref 140–400)
RBC: 4.06 10*6/uL (ref 3.80–5.10)
RDW: 14.4 % (ref 11.0–15.0)
WBC: 11.9 10*3/uL — ABNORMAL HIGH (ref 3.8–10.8)

## 2018-03-21 LAB — PROTIME-INR
INR: 0.9
PROTHROMBIN TIME: 9.8 s (ref 9.0–11.5)

## 2018-03-21 LAB — HEPATITIS B SURFACE ANTIBODY,QUALITATIVE: HEP B S AB: NONREACTIVE

## 2018-03-21 LAB — HEPATITIS A ANTIBODY, TOTAL: HEPATITIS A AB,TOTAL: NONREACTIVE

## 2018-03-21 LAB — HEPATITIS B CORE ANTIBODY, TOTAL: HEP B C TOTAL AB: NONREACTIVE

## 2018-03-21 LAB — HEPATITIS B SURFACE ANTIGEN: Hepatitis B Surface Ag: NONREACTIVE

## 2018-03-21 LAB — HIV ANTIBODY (ROUTINE TESTING W REFLEX): HIV 1&2 Ab, 4th Generation: NONREACTIVE

## 2018-03-22 LAB — HEPATITIS C RNA QUANTITATIVE
HCV Quantitative Log: <1.18 Log IU/mL
HCV RNA, PCR, QN: <15 IU/mL

## 2018-03-22 LAB — HEPATITIS C GENOTYPE: HCV Genotype: NOT DETECTED

## 2018-03-25 LAB — LIVER FIBROSIS, FIBROTEST-ACTITEST
ALPHA-2-MACROGLOBULIN: 275 mg/dL (ref 106–279)
ALT: 7 U/L (ref 6–29)
Apolipoprotein A1: 124 mg/dL (ref 101–198)
BILIRUBIN: 0.1 mg/dL — AB (ref 0.2–1.2)
FIBROSIS SCORE: 0.05
GGT: 10 U/L (ref 3–55)
HAPTOGLOBIN: 322 mg/dL — AB (ref 43–212)
Necroinflammat ACT Score: 0.01
REFERENCE ID: 2499820

## 2018-03-28 ENCOUNTER — Telehealth: Payer: Self-pay | Admitting: Pharmacist Clinician (PhC)/ Clinical Pharmacy Specialist

## 2018-03-28 ENCOUNTER — Other Ambulatory Visit: Payer: Self-pay

## 2018-03-28 ENCOUNTER — Other Ambulatory Visit: Payer: Self-pay | Admitting: Pharmacist Clinician (PhC)/ Clinical Pharmacy Specialist

## 2018-03-28 DIAGNOSIS — B182 Chronic viral hepatitis C: Secondary | ICD-10-CM

## 2018-03-28 NOTE — Progress Notes (Signed)
Repeating hep C VL

## 2018-03-28 NOTE — Telephone Encounter (Signed)
Dawn Kane walked into the clinic today to ask about the process to get her [name]. I saw her 5/28 to get updated labs. While figuring what she needed at that point, I review her labs and it came back undetectable VL and no genotype. At the visit with Dr. Luciana Axeomer in 10/18, her labs was faxed over with 1a and a positive VL of 3290. Asked extensively if she was using in the past year or so. She adamantly denied it. 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 Going to repeat the VL today to make sure if this is a true neg test or a sample error. If it is indeed resolved, will schedule her to see Dr. Luciana Axeomer have F3/4. Plt>150k.

## 2018-03-29 NOTE — Telephone Encounter (Signed)
Interesting.  I wonder if the previous 3290 was a false positive.

## 2018-03-30 ENCOUNTER — Other Ambulatory Visit: Payer: Self-pay | Admitting: Pharmacist Clinician (PhC)/ Clinical Pharmacy Specialist

## 2018-03-30 ENCOUNTER — Telehealth: Payer: Self-pay | Admitting: Pharmacist Clinician (PhC)/ Clinical Pharmacy Specialist

## 2018-03-30 LAB — HEPATITIS C RNA QUANTITATIVE
HCV QUANT LOG: NOT DETECTED {Log_IU}/mL
HCV RNA, PCR, QN: NOT DETECTED [IU]/mL

## 2018-03-30 NOTE — Telephone Encounter (Signed)
Dawn Kane has indeed resolved her hep C without treatment. Dr. Luciana Axeomer would like to see her one last time for GI referral. Scheduled her for July.

## 2018-04-10 ENCOUNTER — Ambulatory Visit
Admission: RE | Admit: 2018-04-10 | Discharge: 2018-04-10 | Disposition: A | Discharge status: Home / Self Care | Payer: No Typology Code available for payment source | Source: Ambulatory Visit | Attending: Nurse Practitioner | Admitting: Nurse Practitioner

## 2018-04-10 DIAGNOSIS — Z1231 Encounter for screening mammogram for malignant neoplasm of breast: Secondary | ICD-10-CM

## 2018-04-25 ENCOUNTER — Ambulatory Visit: Payer: Self-pay | Admitting: Internal Medicine

## 2018-06-23 ENCOUNTER — Encounter: Payer: Self-pay | Admitting: *Deleted

## 2018-06-23 ENCOUNTER — Other Ambulatory Visit: Payer: Self-pay

## 2018-06-23 ENCOUNTER — Emergency Department
Admission: EM | Admit: 2018-06-23 | Discharge: 2018-06-23 | Discharge status: Against Medical Advice | Payer: No Typology Code available for payment source | Attending: Emergency Medicine | Admitting: Emergency Medicine

## 2018-06-23 DIAGNOSIS — K6289 Other specified diseases of anus and rectum: Secondary | ICD-10-CM | POA: Insufficient documentation

## 2018-06-23 DIAGNOSIS — F17210 Nicotine dependence, cigarettes, uncomplicated: Secondary | ICD-10-CM | POA: Insufficient documentation

## 2018-06-23 DIAGNOSIS — Z79899 Other long term (current) drug therapy: Secondary | ICD-10-CM | POA: Insufficient documentation

## 2018-06-23 DIAGNOSIS — I10 Essential (primary) hypertension: Secondary | ICD-10-CM | POA: Insufficient documentation

## 2018-06-23 DIAGNOSIS — J449 Chronic obstructive pulmonary disease, unspecified: Secondary | ICD-10-CM | POA: Insufficient documentation

## 2018-06-23 DIAGNOSIS — E039 Hypothyroidism, unspecified: Secondary | ICD-10-CM | POA: Insufficient documentation

## 2018-06-23 MED ORDER — PHENYLEPHRINE IN HARD FAT 0.25 % RE SUPP
1.0000 | Freq: Two times a day (BID) | RECTAL | Status: DC
  Filled 2018-06-23: qty 1

## 2018-06-23 MED ORDER — HYDROCORTISONE ACE-PRAMOXINE 1-1 % RE FOAM
Freq: Once | RECTAL | Status: DC
  Filled 2018-06-23: qty 10

## 2018-06-23 NOTE — ED Triage Notes (Signed)
FIRST NURSE NOTE-here for hemorrhoid. C/o rectal pain.

## 2018-06-23 NOTE — ED Provider Notes (Signed)
[facility] Emergency Department Provider Note  ____________________________________________  Time seen: Approximately 5:33 PM  I have reviewed the triage vital signs and the nursing notes.   HISTORY  Chief Complaint Hemorrhoids    HPI Dawn Kane is a 47 y.o. female that presents emergency department for evaluation of worsening chronic hemorrhage.  Patient states that she has taken several steroid creams, numbing creams, sitz baths, stool softeners and hemorrhoid pain is worsening.  She has an appointment with general surgery on Thursday but cannot wait that long.  She called primary care, who told her to come to the ED if pain is too bad.  She was recently prescribed a new suppository but it was too expensive to fill.  She would like to see a Careers adviser. She occasionally has blood drops on her toilet paper after wiping.  She is not constipated or having diarrhea.  No nausea, vomiting, abdominal pain.   Past Medical History:  Diagnosis Date  . Anxiety   . COPD (chronic obstructive pulmonary disease) (HCC)   . GERD (gastroesophageal reflux disease)   . Hyperlipidemia   . Hypertension   . Hypothyroidism   . Obesity     Patient Active Problem List   Diagnosis Date Noted  . Chronic hepatitis C without hepatic coma (HCC) 08/01/2017  . Sepsis (HCC) 07/09/2016  . CAP (community acquired pneumonia) 07/09/2016  . Anxiety 07/09/2016  . Hypothyroidism 07/09/2016    Past Surgical History:  Procedure Laterality Date  . CLAVICLE SURGERY    . LUNG SURGERY    . MANDIBLE SURGERY      Prior to Admission medications   Medication Sig Start Date End Date Taking? Authorizing Provider  ALPRAZolam Prudy Feeler) 0.5 MG tablet Take 0.5 mg by mouth at bedtime as needed for anxiety.    [provider]  gemfibrozil (LOPID) 600 MG tablet TAKE 1 TABLET (600 MG) BY ORAL ROUTE 2 TIMES PER DAY 30 MINUTES BEFORE MORNING AND EVENING MEAL 07/19/17   [provider]   levothyroxine (SYNTHROID, LEVOTHROID) 200 MCG tablet Take 200 mcg by mouth daily before breakfast.     [provider]  lisinopril-hydrochlorothiazide (PRINZIDE,ZESTORETIC) 10-12.5 MG tablet Take 1 tablet by mouth daily.    [provider]  omeprazole (PRILOSEC) 20 MG capsule Take by mouth.    [provider]  predniSONE (DELTASONE) 20 MG tablet Two daily with food 12/04/17   Elvina Sidle, MD    Allergies Amoxicillin and Aspirin  Family History  Problem Relation Age of Onset  . Heart disease Paternal Grandmother   . Cancer Paternal Grandfather        pancreatic    Social History Social History   Tobacco Use  . Smoking status: Current Every Day Smoker    Packs/day: 1.00    Types: Cigarettes  . Smokeless tobacco: Never Used  Substance Use Topics  . Alcohol use: No    Comment: quit a year ago  . Drug use: Yes    Types: Marijuana     Review of Systems  Constitutional: No fever Gastrointestinal: No abdominal pain.  No nausea, no vomiting.  Musculoskeletal: Negative for musculoskeletal pain. Skin: Negative for rash, abrasions, lacerations, ecchymosis. Neurological: Negative for numbness or tingling   ____________________________________________   PHYSICAL EXAM:  VITAL SIGNS: ED Triage Vitals [06/23/18 1700]  Enc Vitals Group     BP 92/69     Pulse Rate 83     Resp 16     Temp 97.8 F (36.6  C)     Temp Source Oral     SpO2 99 %     Weight 134 lb (60.8 kg)     Height 5' 2.5" (1.588 m)     Head Circumference      Peak Flow      Pain Score 10     Pain Loc      Pain Edu?      Excl. in GC?      Constitutional: Alert and oriented. Well appearing and in no acute distress. Eyes: Conjunctivae are normal. PERRL. EOMI. Head: Atraumatic. ENT:      Ears:      Nose: No congestion/rhinnorhea.      Mouth/Throat: Mucous membranes are moist.  Neck: No stridor. Cardiovascular: Normal rate, regular rhythm.  Good peripheral  circulation. Respiratory: Normal respiratory effort without tachypnea or retractions. Lungs CTAB. Good air entry to the bases with no decreased or absent breath sounds. Gastrointestinal: Bowel sounds 4 quadrants. Soft and nontender to palpation. No guarding or rigidity. No palpable masses. No distention. Musculoskeletal: Full range of motion to all extremities. No gross deformities appreciated. Rectal: 1 cm nonthrombosed hemorrhoid to 6 PM position. 1/4 cm nonthrombosed hemorrhoid to 10pm position.  Tenderness to palpation directly over hemorrhoids.  No surrounding erythema. Neurologic:  Normal speech and language. No gross focal neurologic deficits are appreciated.  Skin:  Skin is warm, dry and intact. No rash noted. Psychiatric: Mood and affect are normal. Speech and behavior are normal. Patient exhibits appropriate insight and judgement.   ____________________________________________   LABS (all labs ordered are listed, but only abnormal results are displayed)  Labs Reviewed - No data to display ____________________________________________  EKG   ____________________________________________  RADIOLOGY  No results found.  ____________________________________________    PROCEDURES  Procedure(s) performed:    Procedures    Medications - No data to display   ____________________________________________   INITIAL IMPRESSION / ASSESSMENT AND PLAN / ED COURSE  Pertinent labs & imaging results that were available during my care of the patient were reviewed by me and considered in my medical decision making (see chart for details).  Review of the [place] CSRS was performed in accordance of the [facility] prior to dispensing any controlled drugs.   Patient's diagnosis is consistent with hemorroids.  Patient left the ER after initial evaluation.  Patient is given ED precautions to return to the ED for any worsening or new  symptoms.     ____________________________________________  FINAL CLINICAL IMPRESSION(S) / ED DIAGNOSES  Final diagnoses:  None      NEW MEDICATIONS STARTED DURING THIS VISIT:  ED Discharge Orders    None          This chart was dictated using voice recognition software/Dragon. Despite best efforts to proofread, errors can occur which can change the meaning. Any change was purely unintentional.    Enid Derry, PA-C 06/23/18 2353    Emily Filbert, MD 06/24/18 [phone]

## 2018-06-23 NOTE — ED Notes (Signed)
Pt left without getting her proctofoam or her discharge instructions.

## 2018-06-23 NOTE — ED Triage Notes (Signed)
Pt to ED reporting chronic Hemorroids that have worsened recently. Pt has trouble having bowel movements without increased pain. Pt has suppositories and cream that she reports she does not have relief with.

## 2018-06-29 ENCOUNTER — Other Ambulatory Visit: Payer: Self-pay | Admitting: Gastroenterology

## 2018-07-03 ENCOUNTER — Encounter (HOSPITAL_COMMUNITY): Payer: Self-pay | Admitting: *Deleted

## 2018-07-03 NOTE — Anesthesia Preprocedure Evaluation (Addendum)
Anesthesia Evaluation  Patient identified by MRN, date of birth, ID band Patient awake    Reviewed: Allergy & Precautions, NPO status , Patient's Chart, lab work & pertinent test results  History of Anesthesia Complications Negative for: history of anesthetic complications  Airway Mallampati: II  TM Distance: >3 FB Neck ROM: Full    Dental  (+) Edentulous Upper, Edentulous Lower   Pulmonary COPD,  COPD inhaler, Current Smoker,    breath sounds clear to auscultation       Cardiovascular Exercise Tolerance: Good hypertension, Pt. on medications  Rhythm:Regular Rate:Normal     Neuro/Psych Anxiety negative neurological ROS     GI/Hepatic GERD  Medicated,(+)     substance abuse  marijuana use, Hepatitis -, C  Endo/Other  Hypothyroidism   Renal/GU negative Renal ROS  negative genitourinary   Musculoskeletal negative musculoskeletal ROS (+)   Abdominal   Peds  Hematology negative hematology ROS (+)   Anesthesia Other Findings   Reproductive/Obstetrics                           Anesthesia Physical Anesthesia Plan  ASA: III  Anesthesia Plan: MAC   Post-op Pain Management:    Induction: Intravenous  PONV Risk Score and Plan: 2 and Propofol infusion and Treatment may vary due to age or medical condition  Airway Management Planned: Nasal Cannula and Natural Airway  Additional Equipment: None  Intra-op Plan:   Post-operative Plan:   Informed Consent: I have reviewed the patients History and Physical, chart, labs and discussed the procedure including the risks, benefits and alternatives for the proposed anesthesia with the patient or authorized representative who has indicated his/her understanding and acceptance.     Plan Discussed with: CRNA and Anesthesiologist  Anesthesia Plan Comments:        Anesthesia Quick Evaluation

## 2018-07-04 ENCOUNTER — Ambulatory Visit (HOSPITAL_COMMUNITY): Payer: Self-pay | Admitting: Anesthesiology

## 2018-07-04 ENCOUNTER — Encounter (HOSPITAL_COMMUNITY): Payer: Self-pay | Admitting: *Deleted

## 2018-07-04 ENCOUNTER — Other Ambulatory Visit: Payer: Self-pay

## 2018-07-04 ENCOUNTER — Encounter (HOSPITAL_COMMUNITY)
Admission: RE | Disposition: A | Discharge status: Home / Self Care | Payer: Self-pay | Source: Ambulatory Visit | Attending: Gastroenterology

## 2018-07-04 ENCOUNTER — Ambulatory Visit (HOSPITAL_COMMUNITY)
Admission: RE | Admit: 2018-07-04 | Discharge: 2018-07-04 | Disposition: A | Discharge status: Home / Self Care | Payer: Self-pay | Source: Ambulatory Visit | Attending: Gastroenterology | Admitting: Gastroenterology

## 2018-07-04 DIAGNOSIS — J449 Chronic obstructive pulmonary disease, unspecified: Secondary | ICD-10-CM | POA: Insufficient documentation

## 2018-07-04 DIAGNOSIS — K644 Residual hemorrhoidal skin tags: Secondary | ICD-10-CM | POA: Insufficient documentation

## 2018-07-04 DIAGNOSIS — K621 Rectal polyp: Secondary | ICD-10-CM | POA: Insufficient documentation

## 2018-07-04 DIAGNOSIS — Z79899 Other long term (current) drug therapy: Secondary | ICD-10-CM | POA: Insufficient documentation

## 2018-07-04 DIAGNOSIS — E039 Hypothyroidism, unspecified: Secondary | ICD-10-CM | POA: Insufficient documentation

## 2018-07-04 DIAGNOSIS — F17210 Nicotine dependence, cigarettes, uncomplicated: Secondary | ICD-10-CM | POA: Insufficient documentation

## 2018-07-04 DIAGNOSIS — F419 Anxiety disorder, unspecified: Secondary | ICD-10-CM | POA: Insufficient documentation

## 2018-07-04 DIAGNOSIS — K635 Polyp of colon: Secondary | ICD-10-CM | POA: Insufficient documentation

## 2018-07-04 DIAGNOSIS — K219 Gastro-esophageal reflux disease without esophagitis: Secondary | ICD-10-CM | POA: Insufficient documentation

## 2018-07-04 DIAGNOSIS — E785 Hyperlipidemia, unspecified: Secondary | ICD-10-CM | POA: Insufficient documentation

## 2018-07-04 DIAGNOSIS — I10 Essential (primary) hypertension: Secondary | ICD-10-CM | POA: Insufficient documentation

## 2018-07-04 HISTORY — PX: COLONOSCOPY WITH PROPOFOL: SHX5780

## 2018-07-04 HISTORY — PX: POLYPECTOMY: SHX5525

## 2018-07-04 SURGERY — COLONOSCOPY WITH PROPOFOL
Anesthesia: Monitor Anesthesia Care

## 2018-07-04 MED ORDER — LIDOCAINE 2% (20 MG/ML) 5 ML SYRINGE
INTRAMUSCULAR | Status: DC | PRN
  Administered 2018-07-04: 100 mg via INTRAVENOUS

## 2018-07-04 MED ORDER — PROPOFOL 10 MG/ML IV BOLUS
INTRAVENOUS | Status: DC | PRN
  Administered 2018-07-04: 20 mg via INTRAVENOUS
  Administered 2018-07-04: 30 mg via INTRAVENOUS

## 2018-07-04 MED ORDER — PROPOFOL 10 MG/ML IV BOLUS
INTRAVENOUS | Status: AC
  Filled 2018-07-04: qty 40

## 2018-07-04 MED ORDER — PROPOFOL 500 MG/50ML IV EMUL
INTRAVENOUS | Status: DC | PRN
  Administered 2018-07-04: 125 ug/kg/min via INTRAVENOUS

## 2018-07-04 MED ORDER — SODIUM CHLORIDE 0.9 % IV SOLN: INTRAVENOUS | Status: DC

## 2018-07-04 MED ORDER — LACTATED RINGERS IV SOLN
INTRAVENOUS | Status: DC
  Administered 2018-07-04: 09:00:00 via INTRAVENOUS

## 2018-07-04 SURGICAL SUPPLY — 21 items

## 2018-07-04 NOTE — Discharge Instructions (Signed)

## 2018-07-04 NOTE — H&P (Signed)
Primary Care Physician:  Valerie RoysBrown, Nykedtra Martin, FNP Primary Gastroenterologist:  Dr. Levora AngelBrahmbhatt  Reason for Visit :  rectal pain, heme positive stool  HPI: Dawn Kane is a 47 y.o. female was seen in the clinic for further evaluation of rectal pain and heme positive stool. She is undergoing colonoscopy today for further evaluation. She is denying abdominal pain, nausea vomiting. She denies diarrhea and constipation.    Past Medical History:  Diagnosis Date  . Anxiety   . COPD (chronic obstructive pulmonary disease) (HCC)   . GERD (gastroesophageal reflux disease)   . Hyperlipidemia   . Hypertension   . Hypothyroidism   . Obesity     Past Surgical History:  Procedure Laterality Date  . CLAVICLE SURGERY    . LUNG SURGERY    . MANDIBLE SURGERY      Prior to Admission medications   Medication Sig Start Date End Date Taking? Authorizing Provider  albuterol (PROVENTIL HFA;VENTOLIN HFA) 108 (90 Base) MCG/ACT inhaler Inhale 1-2 puffs into the lungs every 6 (six) hours as needed for wheezing or shortness of breath.   Yes [provider]  dibucaine (NUPERCAINAL) 1 % OINT Place 1 application rectally 3 (three) times daily as needed for hemorrhoids or anal irritation.  05/08/18  Yes [provider]  gemfibrozil (LOPID) 600 MG tablet Take 600 mg by mouth 2 (two) times daily before a meal.  07/19/17  Yes [provider]  ibuprofen (ADVIL,MOTRIN) 200 MG tablet Take 200 mg by mouth every 6 (six) hours as needed for headache or moderate pain.   Yes [provider]  levothyroxine (SYNTHROID, LEVOTHROID) 175 MCG tablet Take 175 mcg by mouth daily before breakfast.    Yes [provider]  Lidocaine, Anorectal, (RECTICARE EX) Apply 1 application topically 2 (two) times daily. Recticare Advanced Cream   Yes [provider]  lisinopril-hydrochlorothiazide (PRINZIDE,ZESTORETIC) 20-25 MG tablet Take 1 tablet by mouth at bedtime.    Yes [provider]  omeprazole (PRILOSEC) 20 MG capsule Take 20 mg by mouth daily as needed (for acid reflux).    Yes [provider]  polyethylene glycol (MIRALAX / GLYCOLAX) packet Take 17 g by mouth every evening.   Yes [provider]  Simethicone (GAS-X PO) Take 1 tablet by mouth daily as needed (for gas).   Yes [provider]  hydrocortisone 2.5 % cream Apply 1 application topically 2 (two) times daily as needed (for itching).     [provider]    Scheduled Meds: Continuous Infusions: . sodium chloride    . lactated ringers     PRN Meds:.  Allergies as of 06/29/2018 - Review Complete 06/23/2018  Allergen Reaction Noted  . Amoxicillin Nausea And Vomiting 08/23/2013  . Aspirin Nausea And Vomiting 08/23/2013    Family History  Problem Relation Age of Onset  . Heart disease Paternal Grandmother   . Cancer Paternal Grandfather        pancreatic    Social History   Socioeconomic History  . Marital status: Single    Spouse name: Not on file  . Number of children: Not on file  . Years of education: Not on file  . Highest education level: Not on file  Occupational History  . Not on file  Social Needs  . Financial resource strain: Not on file  . Food insecurity:    Worry: Not on file    Inability: Not on file  . Transportation needs:    Medical: Not  on file    Non-medical: Not on file  Tobacco Use  . Smoking status: Current Every Day Smoker    Packs/day: 1.50    Types: Cigarettes  . Smokeless tobacco: Never Used  Substance and Sexual Activity  . Alcohol use: No    Comment: quit a year ago  . Drug use: Yes    Frequency: 3.0 times per week    Types: Marijuana  . Sexual activity: Yes    Partners: Male    Birth control/protection: Surgical  Lifestyle  . Physical activity:    Days per week: Not on file    Minutes per session: Not on file  . Stress: Not on file  Relationships  . Social connections:    Talks on phone: Not on  file    Gets together: Not on file    Attends religious service: Not on file    Active member of club or organization: Not on file    Attends meetings of clubs or organizations: Not on file    Relationship status: Not on file  . Intimate partner violence:    Fear of current or ex partner: Not on file    Emotionally abused: Not on file    Physically abused: Not on file    Forced sexual activity: Not on file  Other Topics Concern  . Not on file  Social History Narrative  . Not on file    Review of Systems: All negative except as stated above in HPI.  Physical Exam: Vital signs: There were no vitals filed for this visit.   General:   Alert,  Well-developed, well-nourished, pleasant and cooperative in NAD Lungs:  Clear throughout to auscultation.   No wheezes, crackles, or rhonchi. No acute distress. Heart:  Regular rate and rhythm; no murmurs, clicks, rubs,  or gallops. Abdomen: Soft, nontender, nondistended, bowel sounds present. Rectal:  Deferred  GI:  Lab Results: No results for input(s): WBC, HGB, HCT, PLT in the last 72 hours. BMET No results for input(s): NA, K, CL, CO2, GLUCOSE, BUN, CREATININE, CALCIUM in the last 72 hours. LFT No results for input(s): PROT, ALBUMIN, AST, ALT, ALKPHOS, BILITOT, BILIDIR, IBILI in the last 72 hours. PT/INR No results for input(s): LABPROT, INR in the last 72 hours.   Studies/Results: No results found.  Impression/Plan: - rectal pain - heme positive stool  Recommendations -------------------------- - Patient thought that we will be performing surgery for skin tags and hemorrhoids today along with colonoscopy. - Detailed discussion with the patient regarding colonoscopy procedure was done by me personally.. Patient was instructed that, hemorrhoid surgery is not part of routine  colonoscopy. If she was found to have a large hemorrhoids,  she will be referred to surgery for further management.Option of not to proceed with procedure  also discussed with the patient.Patient would like to have colonoscopy done . Occasional risk of worsening rectal pain after colonoscopy also discussed with the patient. She verbalized understanding. - patient is very anxious and frustrated but she is willing to proceed with colonoscopy.  Risks (bleeding, infection, bowel perforation that could require surgery, sedation-related changes in cardiopulmonary systems), benefits (identification and possible treatment of source of symptoms, exclusion of certain causes of symptoms), and alternatives (watchful waiting, radiographic imaging studies, empiric medical treatment)  were explained to patient in detail and patient wishes to proceed.    LOS: 0 days   Kathi Der  MD, FACP 07/04/2018, 8:52 AM  Contact #  [phone]

## 2018-07-04 NOTE — Anesthesia Postprocedure Evaluation (Signed)
Anesthesia Post Note  Patient: Dawn Kane  Procedure(s) Performed: COLONOSCOPY WITH PROPOFOL (N/A ) POLYPECTOMY     Patient location during evaluation: PACU Anesthesia Type: MAC Level of consciousness: awake and alert Pain management: pain level controlled Vital Signs Assessment: post-procedure vital signs reviewed and stable Respiratory status: spontaneous breathing, nonlabored ventilation and respiratory function stable Cardiovascular status: stable and blood pressure returned to baseline Anesthetic complications: no    Last Vitals:  Vitals:   07/04/18 0858 07/04/18 0944  BP: 138/80 101/81  Pulse: (!) 58 64  Resp: 13 (!) 25  Temp: 36.7 C 36.7 C  SpO2: 99% 100%    Last Pain:  Vitals:   07/04/18 0944  TempSrc: Oral  PainSc: 0-No pain                 Audry Pili

## 2018-07-04 NOTE — Transfer of Care (Signed)
Immediate Anesthesia Transfer of Care Note  Patient: Dawn Kane  Procedure(s) Performed: COLONOSCOPY WITH PROPOFOL (N/A ) POLYPECTOMY  Patient Location: PACU  Anesthesia Type:MAC  Level of Consciousness: awake, alert  and oriented  Airway & Oxygen Therapy: Patient Spontanous Breathing and Patient connected to face mask oxygen  Post-op Assessment: Report given to RN and Post -op Vital signs reviewed and stable  Post vital signs: Reviewed and stable  Last Vitals:  Vitals Value Taken Time  BP    Temp    Pulse    Resp    SpO2      Last Pain:  Vitals:   07/04/18 0858  TempSrc: Oral  PainSc: 0-No pain         Complications: No apparent anesthesia complications

## 2018-07-04 NOTE — Op Note (Signed)
[facility] Patient Name: Dawn Kane Procedure Date: 07/04/2018 MRN: [id number] Attending MD: Kathi Der , MD Date of Birth: 01-06-1971 CSN: [id number] Age: 47 Admit Type: Outpatient Procedure:                Colonoscopy Indications:              Heme positive stool, Rectal pain Providers:                Kathi Der, MD, Norman Clay, RN, Zoila Shutter,                            Technician, [facility], CRNA Referring MD:              Medicines:                Sedation Administered by an Anesthesia Professional Complications:            No immediate complications. Estimated Blood Loss:     Estimated blood loss was minimal. Procedure:                Pre-Anesthesia Assessment:                           - Prior to the procedure, a History and Physical                            was performed, and patient medications and                            allergies were reviewed. The patient's tolerance of                            previous anesthesia was also reviewed. The risks                            and benefits of the procedure and the sedation                            options and risks were discussed with the patient.                            All questions were answered, and informed consent                            was obtained. Prior Anticoagulants: The patient has                            taken no previous anticoagulant or antiplatelet                            agents. ASA Grade Assessment: III - A patient with                            severe systemic disease. After reviewing the risks  and benefits, the patient was deemed in                            satisfactory condition to undergo the procedure.                           After obtaining informed consent, the colonoscope                            was passed under direct vision. Throughout the                            procedure, the patient's blood pressure,  pulse, and                            oxygen saturations were monitored continuously. The                            [id number]-H190DL ([id number]) Olympus peds colonscope was                            introduced through the anus and advanced to the the                            terminal ileum, with identification of the                            appendiceal orifice and IC valve. The colonoscopy                            was performed without difficulty. The patient                            tolerated the procedure well. The quality of the                            bowel preparation was adequate to identify polyps 6                            mm and larger in size. Scope In: 9:18:16 AM Scope Out: 9:37:18 AM Scope Withdrawal Time: 0 hours 13 minutes 40 seconds  Total Procedure Duration: 0 hours 19 minutes 2 seconds  Findings:      Skin tags were found on perianal exam.      The digital rectal exam findings include painful digital rectal exam       despite the patient being sedated.      A small amount of liquid stool was found in the sigmoid colon, in the       ascending colon and in the cecum. Lavage of the area was performed,       resulting in clearance with good visualization.      The terminal ileum appeared normal.      A 4 mm polyp was found in the recto-sigmoid colon. The polyp was       sessile. The polyp was removed  with a cold biopsy forceps. Resection and       retrieval were complete.      A localized area of moderately granular, thickened , white and nodular       /polypoid mucosa was found in the distal rectum      Retroflexion in the right colon was performed. Impression:               - Perianal skin tags found on perianal exam.                           - Painful digital rectal exam despite the patient                            being sedated. found on digital rectal exam.                           - The examined portion of the ileum was normal.                            - One 4 mm polyp at the recto-sigmoid colon,                            removed with a cold biopsy forceps. Resected and                            retrieved.                           - Granular and thickened folds of the mucosa in the                            distal rectum. Moderate Sedation:      Moderate (conscious) sedation was personally administered by an       anesthesia professional. The following parameters were monitored: oxygen       saturation, heart rate, blood pressure, and response to care. Recommendation:           - Patient has a contact number available for                            emergencies. The signs and symptoms of potential                            delayed complications were discussed with the                            patient. Return to normal activities tomorrow.                            Written discharge instructions were provided to the                            patient.                           -  Resume previous diet.                           - Continue present medications.                           - Await pathology results.                           - Repeat colonoscopy date to be determined after                            pending pathology results are reviewed for                            surveillance.                           - Refer to a colo-rectal surgeon at appointment to                            be scheduled. Procedure Code(s):        --- Professional ---                           [phone], Colonoscopy, flexible; with biopsy, single                            or multiple Diagnosis Code(s):        --- Professional ---                           D12.7, Benign neoplasm of rectosigmoid junction                           K62.89, Other specified diseases of anus and rectum                           K64.4, Residual hemorrhoidal skin tags                           R19.5, Other fecal abnormalities CPT copyright 2017 American Medical  Association. All rights reserved. The codes documented in this report are preliminary and upon coder review may  be revised to meet current compliance requirements. Kathi DerParag Kianni Lheureux, MD Kathi DerParag Orlanda Frankum, MD 07/04/2018 9:52:04 AM Number of Addenda: 0

## 2018-07-07 ENCOUNTER — Encounter (HOSPITAL_COMMUNITY): Payer: Self-pay | Admitting: Gastroenterology

## 2018-09-18 ENCOUNTER — Other Ambulatory Visit: Payer: Self-pay | Admitting: Family

## 2019-03-03 ENCOUNTER — Emergency Department: Payer: Self-pay | Admitting: Certified Registered Nurse Anesthetist

## 2019-03-03 ENCOUNTER — Other Ambulatory Visit: Payer: Self-pay

## 2019-03-03 ENCOUNTER — Encounter
Admission: EM | Disposition: A | Discharge status: Home / Self Care | Payer: Self-pay | Source: Home / Self Care | Attending: Internal Medicine

## 2019-03-03 ENCOUNTER — Inpatient Hospital Stay
Admission: EM | Admit: 2019-03-03 | Discharge: 2019-03-05 | DRG: 854 | Disposition: A | Discharge status: Home / Self Care | Payer: No Typology Code available for payment source | Attending: Internal Medicine | Admitting: Internal Medicine

## 2019-03-03 ENCOUNTER — Inpatient Hospital Stay: Payer: Self-pay

## 2019-03-03 ENCOUNTER — Emergency Department: Payer: Self-pay

## 2019-03-03 DIAGNOSIS — N202 Calculus of kidney with calculus of ureter: Secondary | ICD-10-CM | POA: Diagnosis present

## 2019-03-03 DIAGNOSIS — N201 Calculus of ureter: Secondary | ICD-10-CM

## 2019-03-03 DIAGNOSIS — N179 Acute kidney failure, unspecified: Secondary | ICD-10-CM | POA: Diagnosis present

## 2019-03-03 DIAGNOSIS — E785 Hyperlipidemia, unspecified: Secondary | ICD-10-CM | POA: Diagnosis present

## 2019-03-03 DIAGNOSIS — N12 Tubulo-interstitial nephritis, not specified as acute or chronic: Secondary | ICD-10-CM | POA: Diagnosis present

## 2019-03-03 DIAGNOSIS — J449 Chronic obstructive pulmonary disease, unspecified: Secondary | ICD-10-CM | POA: Diagnosis present

## 2019-03-03 DIAGNOSIS — Z716 Tobacco abuse counseling: Secondary | ICD-10-CM | POA: Diagnosis not present

## 2019-03-03 DIAGNOSIS — Z9049 Acquired absence of other specified parts of digestive tract: Secondary | ICD-10-CM | POA: Diagnosis not present

## 2019-03-03 DIAGNOSIS — F17210 Nicotine dependence, cigarettes, uncomplicated: Secondary | ICD-10-CM | POA: Diagnosis present

## 2019-03-03 DIAGNOSIS — E669 Obesity, unspecified: Secondary | ICD-10-CM | POA: Diagnosis present

## 2019-03-03 DIAGNOSIS — N200 Calculus of kidney: Secondary | ICD-10-CM | POA: Diagnosis not present

## 2019-03-03 DIAGNOSIS — B9620 Unspecified Escherichia coli [E. coli] as the cause of diseases classified elsewhere: Secondary | ICD-10-CM | POA: Diagnosis present

## 2019-03-03 DIAGNOSIS — Z79890 Hormone replacement therapy (postmenopausal): Secondary | ICD-10-CM

## 2019-03-03 DIAGNOSIS — F1290 Cannabis use, unspecified, uncomplicated: Secondary | ICD-10-CM | POA: Diagnosis present

## 2019-03-03 DIAGNOSIS — I10 Essential (primary) hypertension: Secondary | ICD-10-CM | POA: Diagnosis present

## 2019-03-03 DIAGNOSIS — Z1159 Encounter for screening for other viral diseases: Secondary | ICD-10-CM | POA: Diagnosis not present

## 2019-03-03 DIAGNOSIS — E039 Hypothyroidism, unspecified: Secondary | ICD-10-CM | POA: Diagnosis present

## 2019-03-03 DIAGNOSIS — F419 Anxiety disorder, unspecified: Secondary | ICD-10-CM | POA: Diagnosis present

## 2019-03-03 DIAGNOSIS — Z881 Allergy status to other antibiotic agents status: Secondary | ICD-10-CM

## 2019-03-03 DIAGNOSIS — A419 Sepsis, unspecified organism: Principal | ICD-10-CM | POA: Diagnosis present

## 2019-03-03 DIAGNOSIS — N132 Hydronephrosis with renal and ureteral calculous obstruction: Secondary | ICD-10-CM

## 2019-03-03 DIAGNOSIS — K219 Gastro-esophageal reflux disease without esophagitis: Secondary | ICD-10-CM | POA: Diagnosis present

## 2019-03-03 DIAGNOSIS — Z79899 Other long term (current) drug therapy: Secondary | ICD-10-CM | POA: Diagnosis not present

## 2019-03-03 DIAGNOSIS — N3946 Mixed incontinence: Secondary | ICD-10-CM | POA: Diagnosis present

## 2019-03-03 DIAGNOSIS — Z886 Allergy status to analgesic agent status: Secondary | ICD-10-CM

## 2019-03-03 DIAGNOSIS — N10 Acute tubulo-interstitial nephritis: Secondary | ICD-10-CM | POA: Diagnosis present

## 2019-03-03 DIAGNOSIS — Z8249 Family history of ischemic heart disease and other diseases of the circulatory system: Secondary | ICD-10-CM | POA: Diagnosis not present

## 2019-03-03 DIAGNOSIS — D72829 Elevated white blood cell count, unspecified: Secondary | ICD-10-CM

## 2019-03-03 HISTORY — PX: CYSTOSCOPY W/ URETERAL STENT PLACEMENT: SHX1429

## 2019-03-03 LAB — URINALYSIS, COMPLETE (UACMP) WITH MICROSCOPIC
Bilirubin Urine: NEGATIVE
Glucose, UA: NEGATIVE mg/dL
Ketones, ur: NEGATIVE mg/dL
Nitrite: NEGATIVE
Protein, ur: 30 mg/dL — AB
RBC / HPF: >50 RBC/hpf — ABNORMAL HIGH (ref 0–5)
Specific Gravity, Urine: 1.018 (ref 1.005–1.030)
WBC, UA: >50 WBC/hpf — ABNORMAL HIGH (ref 0–5)
pH: 7 (ref 5.0–8.0)

## 2019-03-03 LAB — CBC
HCT: 44.8 % (ref 36.0–46.0)
Hemoglobin: 14.8 g/dL (ref 12.0–15.0)
MCH: 28.9 pg (ref 26.0–34.0)
MCHC: 33 g/dL (ref 30.0–36.0)
MCV: 87.5 fL (ref 80.0–100.0)
Platelets: 335 10*3/uL (ref 150–400)
RBC: 5.12 MIL/uL — ABNORMAL HIGH (ref 3.87–5.11)
RDW: 13.7 % (ref 11.5–15.5)
WBC: 30.6 10*3/uL — ABNORMAL HIGH (ref 4.0–10.5)
nRBC: 0 % (ref 0.0–0.2)

## 2019-03-03 LAB — LIPASE, BLOOD: Lipase: 40 U/L (ref 11–51)

## 2019-03-03 LAB — COMPREHENSIVE METABOLIC PANEL
ALT: 22 U/L (ref 0–44)
AST: 23 U/L (ref 15–41)
Albumin: 4.2 g/dL (ref 3.5–5.0)
Alkaline Phosphatase: 87 U/L (ref 38–126)
Anion gap: 10 (ref 5–15)
BUN: 27 mg/dL — ABNORMAL HIGH (ref 6–20)
CO2: 24 mmol/L (ref 22–32)
Calcium: 9.8 mg/dL (ref 8.9–10.3)
Chloride: 103 mmol/L (ref 98–111)
Creatinine, Ser: 1.46 mg/dL — ABNORMAL HIGH (ref 0.44–1.00)
GFR calc Af Amer: 49 mL/min — ABNORMAL LOW (ref >60)
GFR calc non Af Amer: 42 mL/min — ABNORMAL LOW (ref >60)
Glucose, Bld: 185 mg/dL — ABNORMAL HIGH (ref 70–99)
Potassium: 3.8 mmol/L (ref 3.5–5.1)
Sodium: 137 mmol/L (ref 135–145)
Total Bilirubin: 0.7 mg/dL (ref 0.3–1.2)
Total Protein: 8.1 g/dL (ref 6.5–8.1)

## 2019-03-03 LAB — POCT PREGNANCY, URINE: Preg Test, Ur: NEGATIVE

## 2019-03-03 LAB — SARS CORONAVIRUS 2 BY RT PCR (HOSPITAL ORDER, PERFORMED IN ~~LOC~~ HOSPITAL LAB): SARS Coronavirus 2: NEGATIVE

## 2019-03-03 SURGERY — CYSTOSCOPY, WITH RETROGRADE PYELOGRAM AND URETERAL STENT INSERTION
Anesthesia: General | Laterality: Right

## 2019-03-03 MED ORDER — PROPOFOL 10 MG/ML IV BOLUS
INTRAVENOUS | Status: DC | PRN
  Administered 2019-03-03: 120 mg via INTRAVENOUS

## 2019-03-03 MED ORDER — PHENYLEPHRINE HCL (PRESSORS) 10 MG/ML IV SOLN
INTRAVENOUS | Status: DC | PRN
  Administered 2019-03-03 (×2): 100 ug via INTRAVENOUS

## 2019-03-03 MED ORDER — ADULT MULTIVITAMIN W/MINERALS CH
1.0000 | ORAL_TABLET | Freq: Every day | ORAL | Status: DC
  Administered 2019-03-04 – 2019-03-05 (×2): 1 via ORAL
  Filled 2019-03-03 (×3): qty 1

## 2019-03-03 MED ORDER — POLYETHYLENE GLYCOL 3350 17 G PO PACK: 17.0000 g | PACK | Freq: Every day | ORAL | Status: DC | PRN

## 2019-03-03 MED ORDER — LEVOTHYROXINE SODIUM 50 MCG PO TABS
175.0000 ug | ORAL_TABLET | Freq: Every day | ORAL | Status: DC
  Administered 2019-03-04 – 2019-03-05 (×2): 175 ug via ORAL
  Filled 2019-03-03 (×2): qty 1

## 2019-03-03 MED ORDER — PROPOFOL 10 MG/ML IV BOLUS
INTRAVENOUS | Status: AC
  Filled 2019-03-03: qty 20

## 2019-03-03 MED ORDER — DEXAMETHASONE SODIUM PHOSPHATE 10 MG/ML IJ SOLN
INTRAMUSCULAR | Status: AC
  Filled 2019-03-03: qty 1

## 2019-03-03 MED ORDER — DEXAMETHASONE SODIUM PHOSPHATE 10 MG/ML IJ SOLN
INTRAMUSCULAR | Status: DC | PRN
  Administered 2019-03-03: 10 mg via INTRAVENOUS

## 2019-03-03 MED ORDER — MIDAZOLAM HCL 2 MG/2ML IJ SOLN
INTRAMUSCULAR | Status: DC | PRN
  Administered 2019-03-03: 2 mg via INTRAVENOUS

## 2019-03-03 MED ORDER — ONDANSETRON HCL 4 MG PO TABS: 4.0000 mg | ORAL_TABLET | Freq: Four times a day (QID) | ORAL | Status: DC | PRN

## 2019-03-03 MED ORDER — FENTANYL CITRATE (PF) 100 MCG/2ML IJ SOLN
INTRAMUSCULAR | Status: AC
  Filled 2019-03-03: qty 2

## 2019-03-03 MED ORDER — SODIUM CHLORIDE 0.9 % IV SOLN
1.0000 g | Freq: Once | INTRAVENOUS | Status: AC
  Administered 2019-03-03: 1 g via INTRAVENOUS
  Filled 2019-03-03: qty 10

## 2019-03-03 MED ORDER — IOHEXOL 300 MG/ML  SOLN
75.0000 mL | Freq: Once | INTRAMUSCULAR | Status: AC | PRN
  Administered 2019-03-03: 13:00:00 75 mL via INTRAVENOUS

## 2019-03-03 MED ORDER — HYDRALAZINE HCL 20 MG/ML IJ SOLN: 5.0000 mg | INTRAMUSCULAR | Status: DC | PRN

## 2019-03-03 MED ORDER — MIDAZOLAM HCL 2 MG/2ML IJ SOLN
INTRAMUSCULAR | Status: AC
  Filled 2019-03-03: qty 2

## 2019-03-03 MED ORDER — SODIUM CHLORIDE 0.9 % IV SOLN
INTRAVENOUS | Status: DC
  Administered 2019-03-03: 23:00:00 via INTRAVENOUS

## 2019-03-03 MED ORDER — ACETAMINOPHEN 325 MG PO TABS
650.0000 mg | ORAL_TABLET | Freq: Four times a day (QID) | ORAL | Status: DC | PRN
  Filled 2019-03-03: qty 2

## 2019-03-03 MED ORDER — LACTATED RINGERS IV SOLN
INTRAVENOUS | Status: DC | PRN
  Administered 2019-03-03: 19:00:00 via INTRAVENOUS

## 2019-03-03 MED ORDER — SUGAMMADEX SODIUM 200 MG/2ML IV SOLN
INTRAVENOUS | Status: AC
  Filled 2019-03-03: qty 2

## 2019-03-03 MED ORDER — ONDANSETRON 4 MG PO TBDP
4.0000 mg | ORAL_TABLET | Freq: Once | ORAL | Status: AC | PRN
  Administered 2019-03-03: 4 mg via ORAL
  Filled 2019-03-03: qty 1

## 2019-03-03 MED ORDER — ONDANSETRON HCL 4 MG/2ML IJ SOLN
INTRAMUSCULAR | Status: DC | PRN
  Administered 2019-03-03: 4 mg via INTRAVENOUS

## 2019-03-03 MED ORDER — FENTANYL CITRATE (PF) 100 MCG/2ML IJ SOLN
INTRAMUSCULAR | Status: DC | PRN
  Administered 2019-03-03 (×2): 50 ug via INTRAVENOUS

## 2019-03-03 MED ORDER — SODIUM CHLORIDE 0.9 % IV SOLN
1.0000 g | Freq: Once | INTRAVENOUS | Status: AC
  Administered 2019-03-03: 19:00:00 1 g via INTRAVENOUS
  Filled 2019-03-03: qty 10

## 2019-03-03 MED ORDER — PANTOPRAZOLE SODIUM 40 MG PO TBEC
40.0000 mg | DELAYED_RELEASE_TABLET | Freq: Every day | ORAL | Status: DC
  Administered 2019-03-04 – 2019-03-05 (×2): 40 mg via ORAL
  Filled 2019-03-03 (×3): qty 1

## 2019-03-03 MED ORDER — SUCCINYLCHOLINE CHLORIDE 20 MG/ML IJ SOLN
INTRAMUSCULAR | Status: DC | PRN
  Administered 2019-03-03: 80 mg via INTRAVENOUS

## 2019-03-03 MED ORDER — SUGAMMADEX SODIUM 200 MG/2ML IV SOLN
INTRAVENOUS | Status: DC | PRN
  Administered 2019-03-03: 113.4 mg via INTRAVENOUS

## 2019-03-03 MED ORDER — ROCURONIUM BROMIDE 100 MG/10ML IV SOLN
INTRAVENOUS | Status: DC | PRN
  Administered 2019-03-03: 10 mg via INTRAVENOUS

## 2019-03-03 MED ORDER — OXYCODONE HCL 5 MG PO TABS: 5.0000 mg | ORAL_TABLET | ORAL | Status: DC | PRN

## 2019-03-03 MED ORDER — ONDANSETRON HCL 4 MG/2ML IJ SOLN
INTRAMUSCULAR | Status: AC
  Filled 2019-03-03: qty 2

## 2019-03-03 MED ORDER — LIDOCAINE HCL (CARDIAC) PF 100 MG/5ML IV SOSY
PREFILLED_SYRINGE | INTRAVENOUS | Status: DC | PRN
  Administered 2019-03-03: 80 mg via INTRAVENOUS

## 2019-03-03 MED ORDER — GEMFIBROZIL 600 MG PO TABS
600.0000 mg | ORAL_TABLET | Freq: Two times a day (BID) | ORAL | Status: DC
  Administered 2019-03-04 (×2): 600 mg via ORAL
  Filled 2019-03-03 (×4): qty 1

## 2019-03-03 MED ORDER — SODIUM CHLORIDE 0.9 % IV SOLN
1.0000 g | INTRAVENOUS | Status: DC
  Filled 2019-03-03: qty 10

## 2019-03-03 MED ORDER — MORPHINE SULFATE (PF) 4 MG/ML IV SOLN
4.0000 mg | INTRAVENOUS | Status: DC | PRN
  Administered 2019-03-03: 4 mg via INTRAVENOUS
  Filled 2019-03-03 (×2): qty 1

## 2019-03-03 MED ORDER — ACETAMINOPHEN 650 MG RE SUPP: 650.0000 mg | Freq: Four times a day (QID) | RECTAL | Status: DC | PRN

## 2019-03-03 MED ORDER — ALBUTEROL SULFATE (2.5 MG/3ML) 0.083% IN NEBU: 3.0000 mL | INHALATION_SOLUTION | Freq: Four times a day (QID) | RESPIRATORY_TRACT | Status: DC | PRN

## 2019-03-03 MED ORDER — SODIUM CHLORIDE 0.9 % IV BOLUS
1000.0000 mL | Freq: Once | INTRAVENOUS | Status: AC
  Administered 2019-03-03: 1000 mL via INTRAVENOUS

## 2019-03-03 MED ORDER — ONDANSETRON HCL 4 MG/2ML IJ SOLN: 4.0000 mg | Freq: Four times a day (QID) | INTRAMUSCULAR | Status: DC | PRN

## 2019-03-03 MED ORDER — PROMETHAZINE HCL 25 MG/ML IJ SOLN
12.5000 mg | Freq: Four times a day (QID) | INTRAMUSCULAR | Status: DC | PRN
  Administered 2019-03-03: 12.5 mg via INTRAVENOUS
  Filled 2019-03-03: qty 1

## 2019-03-03 SURGICAL SUPPLY — 18 items
BAG DRAIN CYSTO-URO LG1000N (MISCELLANEOUS) IMPLANT
CATH COUDE FOLEY 2W 5CC 16FR (CATHETERS) ×3 IMPLANT
CATH URETL 5X70 OPEN END (CATHETERS) ×3 IMPLANT
CATH URETL OPEN END 6X70 (CATHETERS) ×3 IMPLANT
GLOVE BIO SURGEON STRL SZ7.5 (GLOVE) ×9 IMPLANT
GLOVE BIO SURGEON STRL SZ8 (GLOVE) ×3 IMPLANT
GOWN STRL REUS W/ TWL LRG LVL4 (GOWN DISPOSABLE) ×1 IMPLANT
GOWN STRL REUS W/ TWL XL LVL3 (GOWN DISPOSABLE) ×1 IMPLANT
GOWN STRL REUS W/TWL LRG LVL4 (GOWN DISPOSABLE) ×2
GOWN STRL REUS W/TWL XL LVL3 (GOWN DISPOSABLE) ×2
GUIDEWIRE STR ZIPWIRE 035X150 (MISCELLANEOUS) ×3 IMPLANT
PACK CYSTO AR (MISCELLANEOUS) ×3 IMPLANT
SET CYSTO W/LG BORE CLAMP LF (SET/KITS/TRAYS/PACK) ×3 IMPLANT
SOL .9 NS 3000ML IRR  AL (IV SOLUTION) ×2
SOL .9 NS 3000ML IRR UROMATIC (IV SOLUTION) ×1 IMPLANT
STENT URET 6FRX24 CONTOUR (STENTS) ×3 IMPLANT
STENT URET 6FRX26 CONTOUR (STENTS) IMPLANT
WATER STERILE IRR 1000ML POUR (IV SOLUTION) ×3 IMPLANT

## 2019-03-03 NOTE — Transfer of Care (Addendum)
Immediate Anesthesia Transfer of Care Note  Patient: Dawn Kane  Procedure(s) Performed: CYSTOSCOPY WITH RETROGRADE PYELOGRAM/URETERAL STENT PLACEMENT (Right )  Patient Location: PACU  Anesthesia Type:General  Level of Consciousness: drowsy  Airway & Oxygen Therapy: Patient Spontanous Breathing and Patient connected to face mask oxygen  Post-op Assessment: Report given to RN and Post -op Vital signs reviewed and stable  Post vital signs: Reviewed and stable  Last Vitals:  Vitals Value Taken Time  BP    Temp    Pulse    Resp    SpO2      Last Pain:  Vitals:   03/03/19 1129  TempSrc: Oral  PainSc: 9          Complications: No apparent anesthesia complications

## 2019-03-03 NOTE — Anesthesia Preprocedure Evaluation (Signed)
Anesthesia Evaluation  Patient identified by MRN, date of birth, ID band Patient awake    Reviewed: Allergy & Precautions, H&P , NPO status , Patient's Chart, lab work & pertinent test results, reviewed documented beta blocker date and time   History of Anesthesia Complications Negative for: history of anesthetic complications  Airway Mallampati: III  TM Distance: >3 FB Neck ROM: full    Dental  (+) Edentulous Upper, Edentulous Lower, Dental Advidsory Given   Pulmonary neg shortness of breath, COPD,  COPD inhaler, neg recent URI, Current Smoker,           Cardiovascular Exercise Tolerance: Good hypertension, (-) angina(-) Past MI and (-) Cardiac Stents (-) dysrhythmias (-) Valvular Problems/Murmurs     Neuro/Psych PSYCHIATRIC DISORDERS Anxiety negative neurological ROS     GI/Hepatic GERD  ,(+) Hepatitis - (acute, not chronic), C  Endo/Other  neg diabetesHypothyroidism   Renal/GU Renal disease (kidney stone)  negative genitourinary   Musculoskeletal   Abdominal   Peds  Hematology negative hematology ROS (+)   Anesthesia Other Findings Past Medical History: No date: Anxiety No date: COPD (chronic obstructive pulmonary disease) (HCC) No date: GERD (gastroesophageal reflux disease) No date: Hyperlipidemia No date: Hypertension No date: Hypothyroidism No date: Obesity   Reproductive/Obstetrics negative OB ROS                             Anesthesia Physical Anesthesia Plan  ASA: III and emergent  Anesthesia Plan: General   Post-op Pain Management:    Induction: Intravenous, Rapid sequence and Cricoid pressure planned  PONV Risk Score and Plan: 2 and Ondansetron, Dexamethasone, Midazolam, Promethazine and Treatment may vary due to age or medical condition  Airway Management Planned: Oral ETT  Additional Equipment:   Intra-op Plan:   Post-operative Plan: Extubation in  OR  Informed Consent: I have reviewed the patients History and Physical, chart, labs and discussed the procedure including the risks, benefits and alternatives for the proposed anesthesia with the patient or authorized representative who has indicated his/her understanding and acceptance.     Dental Advisory Given  Plan Discussed with: Anesthesiologist, CRNA and Surgeon  Anesthesia Plan Comments:         Anesthesia Quick Evaluation

## 2019-03-03 NOTE — Op Note (Signed)
Preoperative diagnosis: Right proximal ureteral stone, leukocytosis, hydronephrosis  Postoperative diagnosis: Same  Procedure: Cystoscopy with right retrograde pyelogram and right ureteral stent placement  Surgeon: Mena Goes  Anesthesia: General  Indication for procedure: 48 year old white female with persistent nausea and right flank pain.  CT revealed a 9 mm right proximal stone with severe hydronephrosis and perinephric stranding.  Her white count was 30 and out of concern for pyelonephritis and obstruction she was brought for an urgent stent.  Findings: urethra and bladder were unremarkable.   On scout imaging there was a delayed right nephrogram from obstruction and the prior CT contrast. Stone visible on scout. The wire may have pushed the stone back into the kidney.  Right retrograde pyelogram-this outlined a single ureter single collecting system unit with a filling defect in the proximal ureter consistent with the stone and moderate dilation of the collecting system and renal pelvis proximally.  Description of procedure: After consent was obtained patient brought to the operating room.  After adequate anesthesia she is placed in lithotomy position and prepped and draped in the usual sterile fashion.  A timeout was performed to confirm the patient and procedure.  The cystoscope was passed per urethra and the bladder inspected.  The right ureteral orifice was cannulated with a 5 [place] open-ended catheter and retrograde injection of contrast was performed.  Next a sensor wire was advanced past the stone and coiled in the collecting system.  Over the wire a 6 x 24 cm right ureteral stent was advanced.  The wire was removed with a good coil seen in the upper or mid-pole calyx and a good coil in the bladder. The stent was draining well --> debris laden urine. The bladder was drained and the scope removed.  I placed a 16 French Foley catheter to max drain the kidney overnight.  She was awakened  and taken to the recovery room in stable condition.  Complications: none  Blood loss: Minimal  Specimens: None  Drains: #1 right 6 x 24 cm right ureteral stent, #2 Foley catheter  Disposition: Patient stable to PACU --message sent to [facility] for follow-up to consider right shockwave or ureteroscopy.

## 2019-03-03 NOTE — Anesthesia Procedure Notes (Signed)
Procedure Name: Intubation Performed by: Lydia Meng, CRNA Pre-anesthesia Checklist: Patient identified, Patient being monitored, Timeout performed, Emergency Drugs available and Suction available Patient Re-evaluated:Patient Re-evaluated prior to induction Oxygen Delivery Method: Circle system utilized Preoxygenation: Pre-oxygenation with 100% oxygen Induction Type: IV induction, Rapid sequence and Cricoid Pressure applied Laryngoscope Size: Mac and 3 Grade View: Grade I Tube type: Oral Tube size: 7.0 mm Number of attempts: 1 Airway Equipment and Method: Stylet Placement Confirmation: ETT inserted through vocal cords under direct vision,  positive ETCO2 and breath sounds checked- equal and bilateral Secured at: 21 cm Tube secured with: Tape Dental Injury: Teeth and Oropharynx as per pre-operative assessment        

## 2019-03-03 NOTE — Anesthesia Post-op Follow-up Note (Signed)
Anesthesia QCDR form completed.        

## 2019-03-03 NOTE — H&P (Addendum)
[facility] [place] [facility]   PATIENT NAME: Dawn Kane    MR#:  [id number]  DATE OF BIRTH:  08/16/1971  DATE OF ADMISSION:  03/03/2019  PRIMARY CARE PHYSICIAN: Valerie RoysBrown, Nykedtra Martin, FNP   REQUESTING/REFERRING PHYSICIAN: Enid DerryAshley Wagner, PA  CHIEF COMPLAINT:   Chief Complaint  Patient presents with  . Abdominal Pain    HISTORY OF PRESENT ILLNESS:  Dawn Kane  is a 48 y.o. female with a known history of hypertension, hypothyroidism, hyperlipidemia, COPD, and anxiety who presented to the ED with right flank pain, fevers, and chills that started this morning.  She states that the right flank pain is "sharp".  She denies any dysuria, frequency, urgency.  She endorses nausea and vomiting.  She denies any chest pain or shortness of breath..  In the ED, vitals were unremarkable.  Labs were significant for WBC 30.6 and creatinine 1.46.  UA with moderate hemoglobin, moderate leukocytes, rare bacteria, > 50 WBC.  CT abdomen pelvis with 8.5 mm obstructing stone at the right UPJ with severe right-sided hydronephrosis, with at least 4 other nonobstructing stones in the right kidney at least 2 nonobstructing stones in the left kidney.  Urology was consulted and recommended admission for surgery this evening.  Hospitalists were called for admission.  PAST MEDICAL HISTORY:   Past Medical History:  Diagnosis Date  . Anxiety   . COPD (chronic obstructive pulmonary disease) (HCC)   . GERD (gastroesophageal reflux disease)   . Hyperlipidemia   . Hypertension   . Hypothyroidism   . Obesity     PAST SURGICAL HISTORY:   Past Surgical History:  Procedure Laterality Date  . CLAVICLE SURGERY    . COLONOSCOPY WITH PROPOFOL N/A 07/04/2018   Procedure: COLONOSCOPY WITH PROPOFOL;  Surgeon: Kathi DerBrahmbhatt, Parag, MD;  Location: [facility];  Service: Gastroenterology;  Laterality: N/A;  . LUNG SURGERY    . MANDIBLE SURGERY    . POLYPECTOMY  07/04/2018   Procedure:  POLYPECTOMY;  Surgeon: Kathi DerBrahmbhatt, Parag, MD;  Location: [facility];  Service: Gastroenterology;;    SOCIAL HISTORY:   Social History   Tobacco Use  . Smoking status: Current Every Day Smoker    Packs/day: 1.50    Types: Cigarettes  . Smokeless tobacco: Never Used  Substance Use Topics  . Alcohol use: No    Comment: quit a year ago    FAMILY HISTORY:   Family History  Problem Relation Age of Onset  . Heart disease Paternal Grandmother   . Cancer Paternal Grandfather        pancreatic    DRUG ALLERGIES:   Allergies  Allergen Reactions  . Amoxicillin Nausea And Vomiting and Other (See Comments)    Has patient had a PCN reaction causing immediate rash, facial/tongue/throat swelling, SOB or lightheadedness with hypotension: No Has patient had a PCN reaction causing severe rash involving mucus membranes or skin necrosis: No Has patient had a PCN reaction that required hospitalization No Has patient had a PCN reaction occurring within the last 10 years: No If all of the above answers are "NO", then may proceed with Cephalosporin use.   . Aspirin Nausea And Vomiting    REVIEW OF SYSTEMS:   Review of Systems  Constitutional: Positive for chills and fever.  HENT: Negative for congestion and sore throat.   Eyes: Negative for blurred vision and double vision.  Respiratory: Negative for cough and shortness of breath.   Cardiovascular: Negative for chest pain and palpitations.  Gastrointestinal: Positive  for abdominal pain, nausea and vomiting.  Genitourinary: Positive for flank pain. Negative for dysuria, frequency and urgency.  Musculoskeletal: Positive for back pain. Negative for neck pain.  Neurological: Negative for dizziness and headaches.  Psychiatric/Behavioral: Negative for depression and suicidal ideas.    MEDICATIONS AT HOME:   Prior to Admission medications   Medication Sig Start Date End Date Taking? Authorizing Provider  albuterol (PROVENTIL HFA;VENTOLIN  HFA) 108 (90 Base) MCG/ACT inhaler Inhale 1-2 puffs into the lungs every 6 (six) hours as needed for wheezing or shortness of breath.    [provider]  dibucaine (NUPERCAINAL) 1 % OINT Place 1 application rectally 3 (three) times daily as needed for hemorrhoids or anal irritation.  05/08/18   [provider]  gemfibrozil (LOPID) 600 MG tablet Take 600 mg by mouth 2 (two) times daily before a meal.  07/19/17   [provider]  hydrocortisone 2.5 % cream Apply 1 application topically 2 (two) times daily as needed (for itching).     [provider]  ibuprofen (ADVIL,MOTRIN) 200 MG tablet Take 200 mg by mouth every 6 (six) hours as needed for headache or moderate pain.    [provider]  levothyroxine (SYNTHROID, LEVOTHROID) 175 MCG tablet Take 175 mcg by mouth daily before breakfast.     [provider]  Lidocaine, Anorectal, (RECTICARE EX) Apply 1 application topically 2 (two) times daily. Recticare Advanced Cream    [provider]  lisinopril-hydrochlorothiazide (PRINZIDE,ZESTORETIC) 20-25 MG tablet Take 1 tablet by mouth at bedtime.     [provider]  omeprazole (PRILOSEC) 20 MG capsule Take 20 mg by mouth daily as needed (for acid reflux).     [provider]  polyethylene glycol (MIRALAX / GLYCOLAX) packet Take 17 g by mouth every evening.    [provider]  Simethicone (GAS-X PO) Take 1 tablet by mouth daily as needed (for gas).    [provider]      VITAL SIGNS:  Blood pressure (!) 134/116, pulse 88, temperature 98.5 F (36.9 C), temperature source Oral, resp. rate 16, height  (1.575 m), weight 56.7 kg, last menstrual period 06/17/2016, SpO2 94 %.  PHYSICAL EXAMINATION:  Physical Exam  GENERAL:  48 y.o.-year-old patient lying in the bed with no acute distress.  EYES: Pupils equal, round, reactive to light and accommodation. No scleral icterus. Extraocular muscles intact.  HEENT:  Head atraumatic, normocephalic. Oropharynx and nasopharynx clear.  NECK:  Supple, no jugular venous distention. No thyroid enlargement, no tenderness.  LUNGS: Normal breath sounds bilaterally, no wheezing, rales,rhonchi or crepitation. No use of accessory muscles of respiration.  CARDIOVASCULAR: RRR, S1, S2 normal. No murmurs, rubs, or gallops.  ABDOMEN: Soft, nontender, nondistended. Bowel sounds present. No organomegaly or mass.  BACK: +right CVA tenderness EXTREMITIES: No pedal edema, cyanosis, or clubbing.  NEUROLOGIC: Cranial nerves II through XII are intact. Muscle strength 5/5 in all extremities. Sensation intact. Gait not checked.  PSYCHIATRIC: The patient is alert and oriented x 3.  SKIN: No obvious rash, lesion, or ulcer.   LABORATORY PANEL:   CBC Recent Labs  Lab 03/03/19 1208  WBC 30.6*  HGB 14.8  HCT 44.8  PLT 335   ------------------------------------------------------------------------------------------------------------------  Chemistries  Recent Labs  Lab 03/03/19 1208  NA 137  K 3.8  CL 103  CO2 24  GLUCOSE 185*  BUN 27*  CREATININE 1.46*  CALCIUM 9.8  AST 23  ALT 22  ALKPHOS 87  BILITOT 0.7   ------------------------------------------------------------------------------------------------------------------  Cardiac Enzymes No results for input(s): TROPONINI in the last 168 hours. ------------------------------------------------------------------------------------------------------------------  RADIOLOGY:  Ct Abdomen Pelvis W Contrast  Result Date: 03/03/2019 CLINICAL DATA:  Right lower quadrant abdominal pain nausea vomiting beginning this morning. Fever. EXAM: CT ABDOMEN AND PELVIS WITH CONTRAST TECHNIQUE: Multidetector CT imaging of the abdomen and pelvis was performed using the standard protocol following bolus administration of intravenous contrast. CONTRAST:  71mL OMNIPAQUE IOHEXOL 300 MG/ML  SOLN COMPARISON:  None. FINDINGS: Lower chest:  Minimal dependent atelectasis is present at the lung bases. No other focal nodule, mass, or airspace disease is present. The heart size is normal. No significant pleural or pericardial effusion is present. Hepatobiliary: No focal liver abnormality is seen. Status post cholecystectomy. No biliary dilatation. Pancreas: Unremarkable. No pancreatic ductal dilatation or surrounding inflammatory changes. Spleen: Normal in size without focal abnormality. Adrenals/Urinary Tract: Adrenal glands are normal bilaterally. 2 punctate nonobstructing stones are present at the lower pole of the left kidney. The left kidney in ureter otherwise within normal limits. There is no mass lesion or obstruction. Severe right-sided hydronephrosis is secondary to an obstructing 8.5 mm stone at the UPJ. No additional nonobstructing 3 mm stone is present at the lower pole of the right kidney. Three additional punctate nonobstructing stones are present in the right kidney. There is marked stranding about the right kidney without a discrete abscess. There is no evidence for caliceal rupture. The more distal ureter is within normal limits. The urinary bladder is normal. Stomach/Bowel: Stomach is within normal limits. Inflammatory changes about the second and third portion of the duodenum are secondary to the right kidney. Duodenum is otherwise within normal limits. Small bowel is normal. Terminal ileum is within normal limits. The appendix is not discretely visualized and likely surgically absent. Ascending and transverse colon are within normal limits. Distal transverse and descending colon are mostly collapsed. Sigmoid colon is unremarkable. Vascular/Lymphatic: Minimal atherosclerotic changes are present in the aorta and branch vessels. There is no aneurysm. Reproductive: A low-density lesion in the left adnexa measures 5.2 x 3.8 x 3.9 cm. There is a focal calcification within the medial aspect of the lesion. Right adnexa is within normal limits.  Uterus is unremarkable. Other: There is retroperitoneal fluid in the retroperitoneum on the right adjacent to the kidney. No other free fluid is present. No significant ventral hernia is present. Musculoskeletal: Vertebral body heights and alignment are maintained. Schmorl's nodes are present in the lower thoracic spine. Bony pelvis is within normal limits. The hips are located and normal. IMPRESSION: 1. 8.5 mm obstructing stone at the right UPJ with severe right-sided hydronephrosis. 2. At least 4 other nonobstructing stones in the right kidney measure up to 3 mm. 3. At least 2 punctate nonobstructing stones in the left kidney. 4. 5.2 cm low-density lesion in the left adnexa likely represents a cyst. Focal calcification raises the possibility of a dermoid cyst. Recommend non emergent pelvic ultrasound and GYN follow-up. 5. Mild atherosclerotic changes are present within the aorta and branch vessels. Electronically Signed   By: Marin Roberts M.D.   On: 03/03/2019 14:02      IMPRESSION AND PLAN:   Obstructing right kidney stone with right pyelonephritis with severe hydronephrosis.  Not meeting sepsis criteria on admission. -Urology consulted- plan for right ureteral stent placement this evening -Continue ceftriaxone -Urine culture pending -Pain control and IV antiemetics  Acute kidney injury- likely due to above -Holding home lisinopril and HCTZ -IV fluids -Avoid nephrotoxic agents -Recheck creatinine in the morning  Hypertension- BPs controlled -Holding home lisinopril-HCTZ -Hydralazine IV as needed  Left adnexal mass- incidental finding seen on CT abdomen pelvis.  Likely dermoid cyst. -Needs pelvic ultrasound and GYN referral as an outpatient  Hypothyroidism-stable -Continue home Synthroid  Hyperlipidemia-stable -Continue home gemfibrozil  All the records are reviewed and case discussed with ED provider. Management plans discussed with the patient, family and they are in  agreement.  CODE STATUS: Full  TOTAL TIME TAKING CARE OF THIS PATIENT: 45 minutes.    Jinny Blossom Alveda Vanhorne M.D on 03/03/2019 at 2:39 PM  Between 7am to 6pm - Pager - [phone]  After 6pm go to [email or web address] - Social research officer, government  [facility]  Office  [phone]  CC: Primary care physician; Valerie Roys, FNP   Note: This dictation was prepared with Dragon dictation along with smaller phrase technology. Any transcriptional errors that result from this process are unintentional.

## 2019-03-03 NOTE — Discharge Instructions (Signed)
Ureteral Stent Implantation, Care After Refer to this sheet in the next few weeks. These instructions provide you with information about caring for yourself after your procedure. Your health care provider may also give you more specific instructions. Your treatment has been planned according to current medical practices, but problems sometimes occur. Call your health care provider if you have any problems or questions after your procedure.  Be sure to keep follow-up to have the stent and stone removed.  What can I expect after the procedure? After the procedure, it is common to have:  Nausea.  Mild pain when you urinate. You may feel this pain in your lower back or lower abdomen. Pain should stop within a few minutes after you urinate. This may last for up to 1 week.  A small amount of blood in your urine for several days. Follow these instructions at home:  Medicines  Take over-the-counter and prescription medicines only as told by your health care provider.  If you were prescribed an antibiotic medicine, take it as told by your health care provider. Do not stop taking the antibiotic even if you start to feel better.  Do not drive for 24 hours if you received a sedative.  Do not drive or operate heavy machinery while taking prescription pain medicines. Activity  Return to your normal activities as told by your health care provider. Ask your health care provider what activities are safe for you.  Do not lift anything that is heavier than 10 lb (4.5 kg). Follow this limit for 1 week after your procedure, or for as long as told by your health care provider. General instructions  Watch for any blood in your urine. Call your health care provider if the amount of blood in your urine increases.  If you have a catheter: ? Follow instructions from your health care provider about taking care of your catheter and collection bag. ? Do not take baths, swim, or use a hot tub until your health  care provider approves.  Drink enough fluid to keep your urine clear or pale yellow.  Keep all follow-up visits as told by your health care provider. This is important. Contact a health care provider if:  You have pain that gets worse or does not get better with medicine, especially pain when you urinate.  You have difficulty urinating.  You feel nauseous or you vomit repeatedly during a period of more than 2 days after the procedure. Get help right away if:  Your urine is dark red or has blood clots in it.  You are leaking urine (have incontinence).  The end of the stent comes out of your urethra.  You cannot urinate.  You have sudden, sharp, or severe pain in your abdomen or lower back.  You have a fever. This information is not intended to replace advice given to you by your health care provider. Make sure you discuss any questions you have with your health care provider. Document Released: 06/05/2013 Document Revised: 03/10/2016 Document Reviewed: 04/17/2015 Elsevier Interactive Patient Education  2019 [employer].

## 2019-03-03 NOTE — Consult Note (Addendum)
Consult: left UPJ stone, leukocytosis  Requested by: Dr. Willy Eddy  History of Present Illness: 48 yo with right flank pain this AM and nausea. CT revealed a 9 mm right proximal ureteral stone and moderate to severe hydronephrosis. I reviewed the images. There was slight delay in uptake in the right kidney and peri-renal fluid. Her WBC was 30 and she was tachycardic. No fever. UA with rare bac, +LE. Cr 1.46.   She denies a h/o stones. She continues to have nausea and bothersome right flank pain radiating to right abdomen. She reports she has mixed incontinence at baseline.   Modifying factors: There are no other modifying factors  Associated signs and symptoms: There are no other associated signs and symptoms Aggravating and relieving factors: There are no other aggravating or relieving factors Severity: Moderate Duration: Persistent   Past Medical History:  Diagnosis Date  . Anxiety   . COPD (chronic obstructive pulmonary disease) (HCC)   . GERD (gastroesophageal reflux disease)   . Hyperlipidemia   . Hypertension   . Hypothyroidism   . Obesity    Past Surgical History:  Procedure Laterality Date  . CLAVICLE SURGERY    . COLONOSCOPY WITH PROPOFOL N/A 07/04/2018   Procedure: COLONOSCOPY WITH PROPOFOL;  Surgeon: Kathi Der, MD;  Location: [facility];  Service: Gastroenterology;  Laterality: N/A;  . LUNG SURGERY    . MANDIBLE SURGERY    . POLYPECTOMY  07/04/2018   Procedure: POLYPECTOMY;  Surgeon: Kathi Der, MD;  Location: [facility];  Service: Gastroenterology;;    Home Medications:  Medications Prior to Admission  Medication Sig Dispense Refill Last Dose  . albuterol (PROVENTIL HFA;VENTOLIN HFA) 108 (90 Base) MCG/ACT inhaler Inhale 1-2 puffs into the lungs every 6 (six) hours as needed for wheezing or shortness of breath.   Unknown at PRN  . gemfibrozil (LOPID) 600 MG tablet Take 600 mg by mouth 2 (two) times daily before a meal.   3 03/02/2019 at Unknown  time  . hydrocortisone 2.5 % cream Apply 1 application topically 2 (two) times daily as needed (for itching).    Unknown at PRN  . ibuprofen (ADVIL,MOTRIN) 200 MG tablet Take 200-400 mg by mouth every 6 (six) hours as needed for headache or moderate pain.    Unknown at PRN  . levothyroxine (SYNTHROID, LEVOTHROID) 175 MCG tablet Take 175 mcg by mouth daily before breakfast.    03/02/2019 at Unknown time  . lisinopril-hydrochlorothiazide (PRINZIDE,ZESTORETIC) 20-25 MG tablet Take 1 tablet by mouth at bedtime.    03/02/2019 at Unknown time  . Multiple Vitamin (MULTIVITAMIN WITH MINERALS) TABS tablet Take 1 tablet by mouth daily.   03/02/2019 at Unknown time  . omeprazole (PRILOSEC) 20 MG capsule Take 20 mg by mouth daily as needed (for acid reflux).    Unknown at PRN   Allergies:  Allergies  Allergen Reactions  . Amoxicillin Nausea And Vomiting and Other (See Comments)    Has patient had a PCN reaction causing immediate rash, facial/tongue/throat swelling, SOB or lightheadedness with hypotension: No Has patient had a PCN reaction causing severe rash involving mucus membranes or skin necrosis: No Has patient had a PCN reaction that required hospitalization No Has patient had a PCN reaction occurring within the last 10 years: No If all of the above answers are "NO", then may proceed with Cephalosporin use.   . Aspirin Nausea And Vomiting    Family History  Problem Relation Age of Onset  . Heart disease Paternal Grandmother   .  Cancer Paternal Grandfather        pancreatic   Social History:  reports that she has been smoking cigarettes. She has been smoking about 1.50 packs per day. She has never used smokeless tobacco. She reports current drug use. Frequency: 3.00 times per week. Drug: Marijuana. She reports that she does not drink alcohol.  ROS: A complete review of systems was performed.  All systems are negative except for pertinent findings as noted. Review of Systems  Constitutional:  Positive for malaise/fatigue.  Gastrointestinal: Positive for nausea.  All other systems reviewed and are negative.    Physical Exam:  Vital signs in last 24 hours: Temp:  [98.5 F (36.9 C)] 98.5 F (36.9 C) (05/17 1129) Pulse Rate:  [83-96] 96 (05/17 1700) Resp:  [16] 16 (05/17 1129) BP: (134-166)/(74-116) 141/77 (05/17 1700) SpO2:  [94 %-100 %] 96 % (05/17 1700) Weight:  [56.7 kg] 56.7 kg (05/17 1129) General:  Alert and oriented, No acute distress, looks in pain, pacing the room and holding her right flank.  HEENT: Normocephalic, atraumatic Cardiovascular: Regular rate and rhythm Lungs: Regular rate and effort Abdomen: Soft, nontender, nondistended, no abdominal masses Extremities: No edema Neurologic: Grossly intact   Laboratory Data:  Results for orders placed or performed during the hospital encounter of 03/03/19 (from the past 24 hour(s))  Lipase, blood     Status: None   Collection Time: 03/03/19 12:08 PM  Result Value Ref Range   Lipase 40 11 - 51 U/L  Comprehensive metabolic panel     Status: Abnormal   Collection Time: 03/03/19 12:08 PM  Result Value Ref Range   Sodium 137 135 - 145 mmol/L   Potassium 3.8 3.5 - 5.1 mmol/L   Chloride 103 98 - 111 mmol/L   CO2 24 22 - 32 mmol/L   Glucose, Bld 185 (H) 70 - 99 mg/dL   BUN 27 (H) 6 - 20 mg/dL   Creatinine, Ser 1.61 (H) 0.44 - 1.00 mg/dL   Calcium 9.8 8.9 - 09.6 mg/dL   Total Protein 8.1 6.5 - 8.1 g/dL   Albumin 4.2 3.5 - 5.0 g/dL   AST 23 15 - 41 U/L   ALT 22 0 - 44 U/L   Alkaline Phosphatase 87 38 - 126 U/L   Total Bilirubin 0.7 0.3 - 1.2 mg/dL   GFR calc non Af Amer 42 (L) >60 mL/min   GFR calc Af Amer 49 (L) >60 mL/min   Anion gap 10 5 - 15  CBC     Status: Abnormal   Collection Time: 03/03/19 12:08 PM  Result Value Ref Range   WBC 30.6 (H) 4.0 - 10.5 K/uL   RBC 5.12 (H) 3.87 - 5.11 MIL/uL   Hemoglobin 14.8 12.0 - 15.0 g/dL   HCT 04.5 40.9 - 81.1 %   MCV 87.5 80.0 - 100.0 fL   MCH 28.9 26.0 - 34.0  pg   MCHC 33.0 30.0 - 36.0 g/dL   RDW 91.4 78.2 - 95.6 %   Platelets 335 150 - 400 K/uL   nRBC 0.0 0.0 - 0.2 %  SARS Coronavirus 2 (CEPHEID - Performed in [facility] hospital lab), Hosp Order     Status: None   Collection Time: 03/03/19 12:08 PM  Result Value Ref Range   SARS Coronavirus 2 NEGATIVE NEGATIVE  Urinalysis, Complete w Microscopic     Status: Abnormal   Collection Time: 03/03/19 12:10 PM  Result Value Ref Range   Color, Urine YELLOW (A) YELLOW  APPearance HAZY (A) CLEAR   Specific Gravity, Urine 1.018 1.005 - 1.030   pH 7.0 5.0 - 8.0   Glucose, UA NEGATIVE NEGATIVE mg/dL   Hgb urine dipstick MODERATE (A) NEGATIVE   Bilirubin Urine NEGATIVE NEGATIVE   Ketones, ur NEGATIVE NEGATIVE mg/dL   Protein, ur 30 (A) NEGATIVE mg/dL   Nitrite NEGATIVE NEGATIVE   Leukocytes,Ua MODERATE (A) NEGATIVE   RBC / HPF >50 (H) 0 - 5 RBC/hpf   WBC, UA >50 (H) 0 - 5 WBC/hpf   Bacteria, UA RARE (A) NONE SEEN   Squamous Epithelial / LPF 0-5 0 - 5   Mucus PRESENT   Pregnancy, urine POC     Status: None   Collection Time: 03/03/19 12:50 PM  Result Value Ref Range   Preg Test, Ur NEGATIVE NEGATIVE   Recent Results (from the past 240 hour(s))  SARS Coronavirus 2 (CEPHEID - Performed in [facility] lab), Hosp Order     Status: None   Collection Time: 03/03/19 12:08 PM  Result Value Ref Range Status   SARS Coronavirus 2 NEGATIVE NEGATIVE Final    Comment: (NOTE) If result is NEGATIVE SARS-CoV-2 target nucleic acids are NOT DETECTED. The SARS-CoV-2 RNA is generally detectable in upper and lower  respiratory specimens during the acute phase of infection. The lowest  concentration of SARS-CoV-2 viral copies this assay can detect is 250  copies / mL. A negative result does not preclude SARS-CoV-2 infection  and should not be used as the sole basis for treatment or other  patient management decisions.  A negative result may occur with  improper specimen collection / handling,  submission of specimen other  than nasopharyngeal swab, presence of viral mutation(s) within the  areas targeted by this assay, and inadequate number of viral copies  (<250 copies / mL). A negative result must be combined with clinical  observations, patient history, and epidemiological information. If result is POSITIVE SARS-CoV-2 target nucleic acids are DETECTED. The SARS-CoV-2 RNA is generally detectable in upper and lower  respiratory specimens dur ing the acute phase of infection.  Positive  results are indicative of active infection with SARS-CoV-2.  Clinical  correlation with patient history and other diagnostic information is  necessary to determine patient infection status.  Positive results do  not rule out bacterial infection or co-infection with other viruses. If result is PRESUMPTIVE POSTIVE SARS-CoV-2 nucleic acids MAY BE PRESENT.   A presumptive positive result was obtained on the submitted specimen  and confirmed on repeat testing.  While 2019 novel coronavirus  (SARS-CoV-2) nucleic acids may be present in the submitted sample  additional confirmatory testing may be necessary for epidemiological  and / or clinical management purposes  to differentiate between  SARS-CoV-2 and other Sarbecovirus currently known to infect humans.  If clinically indicated additional testing with an alternate test  methodology [phone]([id number]) is advised. The SARS-CoV-2 RNA is generally  detectable in upper and lower respiratory sp ecimens during the acute  phase of infection. The expected result is Negative. Fact Sheet for Patients:  BoilerBrush.com.cyhttps://www.fda.gov/media/136312/download Fact Sheet for Healthcare Providers: https://pope.com/https://www.fda.gov/media/136313/download This test is not yet approved or cleared by the [place] FDA and has been authorized for detection and/or diagnosis of SARS-CoV-2 by FDA under an Emergency Use Authorization (EUA).  This EUA will remain in effect (meaning this test can be  used) for the duration of the COVID-19 declaration under Section 564(b)(1) of the Act, 21 U.S.C. section 360bbb-3(b)(1), unless the authorization is terminated or  revoked sooner. Performed at [facility], [address] [place], [place] [address]    Creatinine: Recent Labs    03/03/19 1208  CREATININE 1.46*    Impression/Assessment/plan:   9 mm right proximal stone - wbc 30, delayed enhancement of right kidney with edema and hydro - plan right ureteral stent ASAP. I'll add another g of ceftriaxone on call to OR. I discussed with the patient the nature, potential benefits, risks and alternatives to cystoscopy, right RGP and right ureteral stent placement, including side effects of the proposed treatment, the likelihood of the patient achieving the goals of the procedure, and any potential problems that might occur during the procedure or recuperation. We discussed the rationale for a staged procedure and treatment of the stone with ESWL or URS/HLL at a later date. I drew her a picture of the anatomy and procedure on the dry erase board. She asked if the stent would help her leaky bladder and we discussed the stent could make incontinence worse. We also discussed stent pain among other risks. All questions answered. Patient elects to proceed.    Jerilee Field 03/03/2019, 5:59 PM

## 2019-03-03 NOTE — ED Triage Notes (Signed)
Pt c/o RLQ pain with n/v since this morning

## 2019-03-03 NOTE — ED Provider Notes (Signed)
[facility] Emergency Department Provider Note  ____________________________________________  Time seen: Approximately 12:17 PM  I have reviewed the triage vital signs and the nursing notes.   HISTORY  Chief Complaint Abdominal Pain    HPI Dawn Kane is a 48 y.o. female that presents to the emergency department for evaluation of sharp right lower quadrant pain and multiple episodes of emesis since 4 AM.  No recent illness.  Patient has had her gallbladder removed and has had her tubes tied.  She is postmenopausal.  She had a normal bowel movement last night.  No fever, shortness of breath, chest pain, urinary symptoms, vaginal discharge.   Past Medical History:  Diagnosis Date  . Anxiety   . COPD (chronic obstructive pulmonary disease) (HCC)   . GERD (gastroesophageal reflux disease)   . Hyperlipidemia   . Hypertension   . Hypothyroidism   . Obesity     Patient Active Problem List   Diagnosis Date Noted  . Chronic hepatitis C without hepatic coma (HCC) 08/01/2017  . Sepsis (HCC) 07/09/2016  . CAP (community acquired pneumonia) 07/09/2016  . Anxiety 07/09/2016  . Hypothyroidism 07/09/2016    Past Surgical History:  Procedure Laterality Date  . CLAVICLE SURGERY    . COLONOSCOPY WITH PROPOFOL N/A 07/04/2018   Procedure: COLONOSCOPY WITH PROPOFOL;  Surgeon: Kathi Der, MD;  Location: [facility];  Service: Gastroenterology;  Laterality: N/A;  . LUNG SURGERY    . MANDIBLE SURGERY    . POLYPECTOMY  07/04/2018   Procedure: POLYPECTOMY;  Surgeon: Kathi Der, MD;  Location: [facility];  Service: Gastroenterology;;    Prior to Admission medications   Medication Sig Start Date End Date Taking? Authorizing Provider  albuterol (PROVENTIL HFA;VENTOLIN HFA) 108 (90 Base) MCG/ACT inhaler Inhale 1-2 puffs into the lungs every 6 (six) hours as needed for wheezing or shortness of breath.   Yes [provider]  gemfibrozil (LOPID)  600 MG tablet Take 600 mg by mouth 2 (two) times daily before a meal.  07/19/17  Yes [provider]  hydrocortisone 2.5 % cream Apply 1 application topically 2 (two) times daily as needed (for itching).    Yes [provider]  ibuprofen (ADVIL,MOTRIN) 200 MG tablet Take 200-400 mg by mouth every 6 (six) hours as needed for headache or moderate pain.    Yes [provider]  levothyroxine (SYNTHROID, LEVOTHROID) 175 MCG tablet Take 175 mcg by mouth daily before breakfast.    Yes [provider]  lisinopril-hydrochlorothiazide (PRINZIDE,ZESTORETIC) 20-25 MG tablet Take 1 tablet by mouth at bedtime.    Yes [provider]  Multiple Vitamin (MULTIVITAMIN WITH MINERALS) TABS tablet Take 1 tablet by mouth daily.   Yes [provider]  omeprazole (PRILOSEC) 20 MG capsule Take 20 mg by mouth daily as needed (for acid reflux).    Yes [provider]    Allergies Amoxicillin and Aspirin  Family History  Problem Relation Age of Onset  . Heart disease Paternal Grandmother   . Cancer Paternal Grandfather        pancreatic    Social History Social History   Tobacco Use  . Smoking status: Current Every Day Smoker    Packs/day: 1.50    Types: Cigarettes  . Smokeless tobacco: Never Used  Substance Use Topics  . Alcohol use: No    Comment: quit a year ago  . Drug use: Yes    Frequency: 3.0 times per week    Types: Marijuana  Review of Systems  Constitutional: No fever/chills Cardiovascular: No chest pain. Respiratory: No SOB. Gastrointestinal: Positive for abdominal pain and vomiting. Musculoskeletal: Negative for musculoskeletal pain. Skin: Negative for rash, abrasions, lacerations, ecchymosis.   ____________________________________________   PHYSICAL EXAM:  VITAL SIGNS: ED Triage Vitals [03/03/19 1129]  Enc Vitals Group     BP (!) 166/84     Pulse Rate 83     Resp 16     Temp 98.5 F (36.9 C)     Temp Source  Oral     SpO2 100 %     Weight 125 lb (56.7 kg)     Height 5\' 2"  (1.575 m)     Head Circumference      Peak Flow      Pain Score 9     Pain Loc      Pain Edu?      Excl. in GC?      Constitutional: Alert and oriented. Well appearing and in no acute distress. Eyes: Conjunctivae are normal. PERRL. EOMI. Head: Atraumatic. ENT:      Ears:      Nose: No congestion/rhinnorhea.      Mouth/Throat: Mucous membranes are moist.  Neck: No stridor.  Cardiovascular: Normal rate, regular rhythm.  Good peripheral circulation. Respiratory: Normal respiratory effort without tachypnea or retractions. Lungs CTAB. Good air entry to the bases with no decreased or absent breath sounds. Gastrointestinal: Bowel sounds 4 quadrants.  Right lower quadrant tenderness to palpation with guarding. No rigidity. No palpable masses. No distention. No CVA tenderness. Musculoskeletal: Full range of motion to all extremities. No gross deformities appreciated. Neurologic:  Normal speech and language. No gross focal neurologic deficits are appreciated.  Skin:  Skin is warm, dry and intact. No rash noted. Psychiatric: Mood and affect are normal. Speech and behavior are normal. Patient exhibits appropriate insight and judgement.   ____________________________________________   LABS (all labs ordered are listed, but only abnormal results are displayed)  Labs Reviewed  COMPREHENSIVE METABOLIC PANEL - Abnormal; Notable for the following components:      Result Value   Glucose, Bld 185 (*)    BUN 27 (*)    Creatinine, Ser 1.46 (*)    GFR calc non Af Amer 42 (*)    GFR calc Af Amer 49 (*)    All other components within normal limits  CBC - Abnormal; Notable for the following components:   WBC 30.6 (*)    RBC 5.12 (*)    All other components within normal limits  URINALYSIS, COMPLETE (UACMP) WITH MICROSCOPIC - Abnormal; Notable for the following components:   Color, Urine YELLOW (*)    APPearance HAZY (*)    Hgb  urine dipstick MODERATE (*)    Protein, ur 30 (*)    Leukocytes,Ua MODERATE (*)    RBC / HPF >50 (*)    WBC, UA >50 (*)    Bacteria, UA RARE (*)    All other components within normal limits  SARS CORONAVIRUS 2 (HOSPITAL ORDER, PERFORMED IN [facility] LAB)  LIPASE, BLOOD  POC URINE PREG, ED  POCT PREGNANCY, URINE   ____________________________________________  EKG   ____________________________________________  RADIOLOGY   Ct Abdomen Pelvis W Contrast  Result Date: 03/03/2019 CLINICAL DATA:  Right lower quadrant abdominal pain nausea vomiting beginning this morning. Fever. EXAM: CT ABDOMEN AND PELVIS WITH CONTRAST TECHNIQUE: Multidetector CT imaging of the abdomen and pelvis was performed using the standard protocol following bolus administration of intravenous contrast. CONTRAST:  34mL OMNIPAQUE IOHEXOL 300 MG/ML  SOLN COMPARISON:  None. FINDINGS: Lower chest: Minimal dependent atelectasis is present at the lung bases. No other focal nodule, mass, or airspace disease is present. The heart size is normal. No significant pleural or pericardial effusion is present. Hepatobiliary: No focal liver abnormality is seen. Status post cholecystectomy. No biliary dilatation. Pancreas: Unremarkable. No pancreatic ductal dilatation or surrounding inflammatory changes. Spleen: Normal in size without focal abnormality. Adrenals/Urinary Tract: Adrenal glands are normal bilaterally. 2 punctate nonobstructing stones are present at the lower pole of the left kidney. The left kidney in ureter otherwise within normal limits. There is no mass lesion or obstruction. Severe right-sided hydronephrosis is secondary to an obstructing 8.5 mm stone at the UPJ. No additional nonobstructing 3 mm stone is present at the lower pole of the right kidney. Three additional punctate nonobstructing stones are present in the right kidney. There is marked stranding about the right kidney without a discrete abscess. There is  no evidence for caliceal rupture. The more distal ureter is within normal limits. The urinary bladder is normal. Stomach/Bowel: Stomach is within normal limits. Inflammatory changes about the second and third portion of the duodenum are secondary to the right kidney. Duodenum is otherwise within normal limits. Small bowel is normal. Terminal ileum is within normal limits. The appendix is not discretely visualized and likely surgically absent. Ascending and transverse colon are within normal limits. Distal transverse and descending colon are mostly collapsed. Sigmoid colon is unremarkable. Vascular/Lymphatic: Minimal atherosclerotic changes are present in the aorta and branch vessels. There is no aneurysm. Reproductive: A low-density lesion in the left adnexa measures 5.2 x 3.8 x 3.9 cm. There is a focal calcification within the medial aspect of the lesion. Right adnexa is within normal limits. Uterus is unremarkable. Other: There is retroperitoneal fluid in the retroperitoneum on the right adjacent to the kidney. No other free fluid is present. No significant ventral hernia is present. Musculoskeletal: Vertebral body heights and alignment are maintained. Schmorl's nodes are present in the lower thoracic spine. Bony pelvis is within normal limits. The hips are located and normal. IMPRESSION: 1. 8.5 mm obstructing stone at the right UPJ with severe right-sided hydronephrosis. 2. At least 4 other nonobstructing stones in the right kidney measure up to 3 mm. 3. At least 2 punctate nonobstructing stones in the left kidney. 4. 5.2 cm low-density lesion in the left adnexa likely represents a cyst. Focal calcification raises the possibility of a dermoid cyst. Recommend non emergent pelvic ultrasound and GYN follow-up. 5. Mild atherosclerotic changes are present within the aorta and branch vessels. Electronically Signed   By: Marin Roberts M.D.   On: 03/03/2019 14:02     ____________________________________________    PROCEDURES  Procedure(s) performed:    Procedures    Medications  morphine 4 MG/ML injection 4 mg (4 mg Intravenous Given 03/03/19 1245)  promethazine (PHENERGAN) injection 12.5 mg (has no administration in time range)  cefTRIAXone (ROCEPHIN) 1 g in sodium chloride 0.9 % 100 mL IVPB (has no administration in time range)  ondansetron (ZOFRAN-ODT) disintegrating tablet 4 mg (4 mg Oral Given 03/03/19 1213)  sodium chloride 0.9 % bolus 1,000 mL (0 mLs Intravenous Stopped 03/03/19 1447)  iohexol (OMNIPAQUE) 300 MG/ML solution 75 mL (75 mLs Intravenous Contrast Given 03/03/19 1327)  cefTRIAXone (ROCEPHIN) 1 g in sodium chloride 0.9 % 100 mL IVPB (0 g Intravenous Stopped 03/03/19 1447)     ____________________________________________   INITIAL IMPRESSION / ASSESSMENT AND PLAN / ED  COURSE  Pertinent labs & imaging results that were available during my care of the patient were reviewed by me and considered in my medical decision making (see chart for details).  Review of the [place] CSRS was performed in accordance of the [facility] prior to dispensing any controlled drugs.   Patient's diagnosis is consistent with right obstructing stone with infection.  EKG shows 8.5 mm obstructing stone at the right UPJ with severe right-sided hydronephrosis and 4 other nonobstructing stones.  Urinalysis consistent with infection.  White blood cell count elevated at 30.6.  Vital signs are stable.  Dr. Roxan Hockey has consulted Dr. Mena Goes, with plans to admit patient for surgery. Patient will be admitted to the hospitalist service. Report was given to Dr. Pollie Friar for admission.    ____________________________________________  FINAL CLINICAL IMPRESSION(S) / ED DIAGNOSES  Final diagnoses:  Renal stone      NEW MEDICATIONS STARTED DURING THIS VISIT:  ED Discharge Orders    None          This chart was dictated using voice recognition software/Dragon.  Despite best efforts to proofread, errors can occur which can change the meaning. Any change was purely unintentional.    Enid Derry, PA-C 03/03/19 1524    Willy Eddy, MD 03/03/19 1531

## 2019-03-04 ENCOUNTER — Inpatient Hospital Stay: Payer: Self-pay

## 2019-03-04 ENCOUNTER — Encounter: Payer: Self-pay | Admitting: Urology

## 2019-03-04 ENCOUNTER — Telehealth: Payer: Self-pay | Admitting: Urology

## 2019-03-04 DIAGNOSIS — N200 Calculus of kidney: Secondary | ICD-10-CM

## 2019-03-04 DIAGNOSIS — N201 Calculus of ureter: Secondary | ICD-10-CM

## 2019-03-04 LAB — BASIC METABOLIC PANEL
Anion gap: 8 (ref 5–15)
BUN: 29 mg/dL — ABNORMAL HIGH (ref 6–20)
CO2: 24 mmol/L (ref 22–32)
Calcium: 8.9 mg/dL (ref 8.9–10.3)
Chloride: 108 mmol/L (ref 98–111)
Creatinine, Ser: 1.7 mg/dL — ABNORMAL HIGH (ref 0.44–1.00)
GFR calc Af Amer: 41 mL/min — ABNORMAL LOW (ref >60)
GFR calc non Af Amer: 35 mL/min — ABNORMAL LOW (ref >60)
Glucose, Bld: 189 mg/dL — ABNORMAL HIGH (ref 70–99)
Potassium: 4.3 mmol/L (ref 3.5–5.1)
Sodium: 140 mmol/L (ref 135–145)

## 2019-03-04 LAB — CBC
HCT: 40.5 % (ref 36.0–46.0)
Hemoglobin: 13.1 g/dL (ref 12.0–15.0)
MCH: 28.7 pg (ref 26.0–34.0)
MCHC: 32.3 g/dL (ref 30.0–36.0)
MCV: 88.8 fL (ref 80.0–100.0)
Platelets: 264 10*3/uL (ref 150–400)
RBC: 4.56 MIL/uL (ref 3.87–5.11)
RDW: 14.3 % (ref 11.5–15.5)
WBC: 38.8 10*3/uL — ABNORMAL HIGH (ref 4.0–10.5)
nRBC: 0 % (ref 0.0–0.2)

## 2019-03-04 MED ORDER — SODIUM CHLORIDE 0.9 % IV SOLN
INTRAVENOUS | Status: DC | PRN
  Administered 2019-03-04: 30 mL via INTRAVENOUS

## 2019-03-04 MED ORDER — IBUPROFEN 400 MG PO TABS
800.0000 mg | ORAL_TABLET | Freq: Four times a day (QID) | ORAL | Status: DC | PRN
  Administered 2019-03-04 – 2019-03-05 (×3): 800 mg via ORAL
  Filled 2019-03-04 (×3): qty 2

## 2019-03-04 MED ORDER — SODIUM CHLORIDE 0.9 % IV SOLN
INTRAVENOUS | Status: DC
  Administered 2019-03-04 – 2019-03-05 (×2): via INTRAVENOUS

## 2019-03-04 MED ORDER — PIPERACILLIN-TAZOBACTAM 3.375 G IVPB
3.3750 g | Freq: Three times a day (TID) | INTRAVENOUS | Status: DC
  Administered 2019-03-04 – 2019-03-05 (×3): 3.375 g via INTRAVENOUS
  Filled 2019-03-04 (×3): qty 50

## 2019-03-04 NOTE — Progress Notes (Addendum)
Urology Consult Follow Up  Subjective: Dawn Kane is tolerating a regular diet and is not complaining of pain.  Low grade fever last night, but afebrile VSS this am.   UOP is only recorded at 140 cc, but Dawn Kane states they have just emptied this am her bag and it was full.   Urine is the bag is dark yellow.   WBC count is 38.8 this am increased from 30.6 yesterday and serum creatinine is 1.70 this am increased from 1.46.    Anti-infectives: Anti-infectives (From admission, onward)   Start     Dose/Rate Route Frequency Ordered Stop   03/04/19 1500  cefTRIAXone (ROCEPHIN) 1 g in sodium chloride 0.9 % 100 mL IVPB     1 g 200 mL/hr over 30 Minutes Intravenous Every 24 hours 03/03/19 1812     03/03/19 1500  cefTRIAXone (ROCEPHIN) 1 g in sodium chloride 0.9 % 100 mL IVPB     1 g 200 mL/hr over 30 Minutes Intravenous  Once 03/03/19 1447 03/03/19 2100   03/03/19 1400  cefTRIAXone (ROCEPHIN) 1 g in sodium chloride 0.9 % 100 mL IVPB     1 g 200 mL/hr over 30 Minutes Intravenous  Once 03/03/19 1346 03/03/19 1447      Current Facility-Administered Medications  Medication Dose Route Frequency Provider Last Rate Last Dose  . acetaminophen (TYLENOL) tablet 650 mg  650 mg Oral Q6H PRN Jerilee Field, MD       Or  . acetaminophen (TYLENOL) suppository 650 mg  650 mg Rectal Q6H PRN Jerilee Field, MD      . albuterol (PROVENTIL) (2.5 MG/3ML) 0.083% nebulizer solution 3 mL  3 mL Inhalation Q6H PRN Jerilee Field, MD      . cefTRIAXone (ROCEPHIN) 1 g in sodium chloride 0.9 % 100 mL IVPB  1 g Intravenous Q24H Jerilee Field, MD      . gemfibrozil (LOPID) tablet 600 mg  600 mg Oral BID [place] Levo Jerilee Field, MD   600 mg at 03/04/19 1191  . hydrALAZINE (APRESOLINE) injection 5 mg  5 mg Intravenous Q4H PRN Jerilee Field, MD      . ibuprofen (ADVIL) tablet 800 mg  800 mg Oral Q6H PRN Arnaldo Natal, MD   800 mg at 03/04/19 0554  . levothyroxine (SYNTHROID) tablet 175 mcg  175 mcg Oral  QAC breakfast Jerilee Field, MD   175 mcg at 03/04/19 0553  . morphine 4 MG/ML injection 4 mg  4 mg Intravenous Q3H PRN Jerilee Field, MD   4 mg at 03/03/19 1245  . multivitamin with minerals tablet 1 tablet  1 tablet Oral Daily Jerilee Field, MD   1 tablet at 03/04/19 [phone]  . ondansetron (ZOFRAN) tablet 4 mg  4 mg Oral Q6H PRN Jerilee Field, MD       Or  . ondansetron [facility]) injection 4 mg  4 mg Intravenous Q6H PRN Jerilee Field, MD      . oxyCODONE (Oxy IR/ROXICODONE) immediate release tablet 5 mg  5 mg Oral Q3H PRN Jerilee Field, MD      . pantoprazole (PROTONIX) EC tablet 40 mg  40 mg Oral Daily Jerilee Field, MD   40 mg at 03/04/19 9562  . polyethylene glycol (MIRALAX / GLYCOLAX) packet 17 g  17 g Oral Daily PRN Jerilee Field, MD      . promethazine (PHENERGAN) injection 12.5 mg  12.5 mg Intravenous Q6H PRN Jerilee Field, MD   12.5 mg at 03/03/19 1535  Objective: Vital signs in last 24 hours: Temp:  [97.7 F (36.5 C)-100.3 F (37.9 C)] 98.2 F (36.8 C) (05/18 0615) Pulse Rate:  [79-96] 79 (05/18 0615) Resp:  [16-20] 18 (05/18 0615) BP: (94-166)/(58-116) 115/82 (05/18 0615) SpO2:  [94 %-100 %] 94 % (05/18 0615) Weight:  [56.7 kg] 56.7 kg (05/17 1129)  Intake/Output from previous day: 05/17 0701 - 05/18 0700 In: 934.3 [I.V.:934.3] Out: 140 [Urine:140] Intake/Output this shift: Total I/O In: -  Out: 200 [Urine:200]   Physical Exam Constitutional:  Well nourished. Alert and oriented, No acute distress.   HEENT: [place] AT, moist mucus membranes.  Trachea midline, no masses. Cardiovascular: No clubbing, cyanosis, or edema. Respiratory: Normal respiratory effort, no increased work of breathing. GI: Abdomen is soft, non tender, non distended, no abdominal masses. Liver and spleen not palpable.  No hernias appreciated.  Stool sample for occult testing is not indicated.   GU: No CVA tenderness.  No bladder fullness or masses.   Skin: No  rashes, bruises or suspicious lesions. Neurologic: Grossly intact, no focal deficits, moving all 4 extremities. Psychiatric: Normal mood and affect.  Lab Results:  Recent Labs    03/03/19 1208 03/04/19 0448  WBC 30.6* 38.8*  HGB 14.8 13.1  HCT 44.8 40.5  PLT 335 264   BMET Recent Labs    03/03/19 1208 03/04/19 0448  NA 137 140  K 3.8 4.3  CL 103 108  CO2 24 24  GLUCOSE 185* 189*  BUN 27* 29*  CREATININE 1.46* 1.70*  CALCIUM 9.8 8.9   PT/INR No results for input(s): LABPROT, INR in the last 72 hours. ABG No results for input(s): PHART, HCO3 in the last 72 hours.  Invalid input(s): PCO2, PO2  Studies/Results: Dg Abd 1 View  Result Date: 03/03/2019 CLINICAL DATA:  Obstructing RIGHT UPJ calculus. RIGHT double-J stent placement. EXAM: Operative ABDOMEN-1 VIEW COMPARISON:  CT abdomen and pelvis earlier same day. FINDINGS: Single spot image of the abdomen centered over the RIGHT kidney is submitted for interpretation postoperatively. A double-J ureteral stent is present with the proximal loop in an UPPER pole infundibulum of the RIGHT kidney. Contrast material is present within the collecting system. The UPJ calculus identified on CT is not conspicuous and may have been removed or is obscured by contrast material. IMPRESSION: RIGHT double-J ureteral stent placement. Electronically Signed   By: Hulan Saas M.D.   On: 03/03/2019 21:00   Ct Abdomen Pelvis W Contrast  Result Date: 03/03/2019 CLINICAL DATA:  Right lower quadrant abdominal pain nausea vomiting beginning this morning. Fever. EXAM: CT ABDOMEN AND PELVIS WITH CONTRAST TECHNIQUE: Multidetector CT imaging of the abdomen and pelvis was performed using the standard protocol following bolus administration of intravenous contrast. CONTRAST:  34mL OMNIPAQUE IOHEXOL 300 MG/ML  SOLN COMPARISON:  None. FINDINGS: Lower chest: Minimal dependent atelectasis is present at the lung bases. No other focal nodule, mass, or airspace  disease is present. The heart size is normal. No significant pleural or pericardial effusion is present. Hepatobiliary: No focal liver abnormality is seen. Status post cholecystectomy. No biliary dilatation. Pancreas: Unremarkable. No pancreatic ductal dilatation or surrounding inflammatory changes. Spleen: Normal in size without focal abnormality. Adrenals/Urinary Tract: Adrenal glands are normal bilaterally. 2 punctate nonobstructing stones are present at the lower pole of the left kidney. The left kidney in ureter otherwise within normal limits. There is no mass lesion or obstruction. Severe right-sided hydronephrosis is secondary to an obstructing 8.5 mm stone at the UPJ. No additional nonobstructing 3  mm stone is present at the lower pole of the right kidney. Three additional punctate nonobstructing stones are present in the right kidney. There is marked stranding about the right kidney without a discrete abscess. There is no evidence for caliceal rupture. The more distal ureter is within normal limits. The urinary bladder is normal. Stomach/Bowel: Stomach is within normal limits. Inflammatory changes about the second and third portion of the duodenum are secondary to the right kidney. Duodenum is otherwise within normal limits. Small bowel is normal. Terminal ileum is within normal limits. The appendix is not discretely visualized and likely surgically absent. Ascending and transverse colon are within normal limits. Distal transverse and descending colon are mostly collapsed. Sigmoid colon is unremarkable. Vascular/Lymphatic: Minimal atherosclerotic changes are present in the aorta and branch vessels. There is no aneurysm. Reproductive: A low-density lesion in the left adnexa measures 5.2 x 3.8 x 3.9 cm. There is a focal calcification within the medial aspect of the lesion. Right adnexa is within normal limits. Uterus is unremarkable. Other: There is retroperitoneal fluid in the retroperitoneum on the right  adjacent to the kidney. No other free fluid is present. No significant ventral hernia is present. Musculoskeletal: Vertebral body heights and alignment are maintained. Schmorl's nodes are present in the lower thoracic spine. Bony pelvis is within normal limits. The hips are located and normal. IMPRESSION: 1. 8.5 mm obstructing stone at the right UPJ with severe right-sided hydronephrosis. 2. At least 4 other nonobstructing stones in the right kidney measure up to 3 mm. 3. At least 2 punctate nonobstructing stones in the left kidney. 4. 5.2 cm low-density lesion in the left adnexa likely represents a cyst. Focal calcification raises the possibility of a dermoid cyst. Recommend non emergent pelvic ultrasound and GYN follow-up. 5. Mild atherosclerotic changes are present within the aorta and branch vessels. Electronically Signed   By: Marin Robertshristopher  Mattern M.D.   On: 03/03/2019 14:02   Dg C-arm 1-60 Min  Result Date: 03/03/2019 CLINICAL DATA:  Obstructing RIGHT UPJ calculus. RIGHT double-J stent placement. EXAM: Operative ABDOMEN-1 VIEW COMPARISON:  CT abdomen and pelvis earlier same day. FINDINGS: Single spot image of the abdomen centered over the RIGHT kidney is submitted for interpretation postoperatively. A double-J ureteral stent is present with the proximal loop in an UPPER pole infundibulum of the RIGHT kidney. Contrast material is present within the collecting system. The UPJ calculus identified on CT is not conspicuous and may have been removed or is obscured by contrast material. IMPRESSION: RIGHT double-J ureteral stent placement. Electronically Signed   By: Hulan Saashomas  Lawrence M.D.   On: 03/03/2019 21:00     Assessment and Plan 1. Right UPJ stone - s/p cystoscopy with right retrograde and right ureteral stent placement - increase of WBC count may be due to recent procedure and manipulation of the stone with stent placement-urine culture is still pending at this time  - increase of creatinine likely  due to IV contrast administration  - Dawn Kane is very adamant concerning being discharged today as she has pets at home that need to be cared for and she has no insurance- recommend Cipro 500 mg BID for fourteen days and KUB prior to going home if discharged - communicated with Dr. Tobi BastosPyreddy and she will stay at least one more night    2. Left adnexal mass -likely a dermoid cyst and follow up with gynecology for a non emergent pelvic ultrasound    LOS: 1 day    [facility] 03/04/2019

## 2019-03-04 NOTE — Telephone Encounter (Signed)
-----   Message from Jerilee Field, MD sent at 03/03/2019  7:07 PM EDT ----- S/p cysto right stent 03/03/2019 - she needs f/u in 1-2 weeks to plan stone treatment/stent removal. Thanks!

## 2019-03-04 NOTE — Progress Notes (Signed)
Advanced care plan. Purpose of the Encounter: CODE STATUS Parties in Attendance: Patient Patient's Decision Capacity: Good Subjective/Patient's story: Dawn Kane  is a 48 y.o. female with a known history of hypertension, hypothyroidism, hyperlipidemia, COPD, and anxiety who presented to the ED with right flank pain, fevers, and chills that started this morning.  She states that the right flank pain is "sharp".  She denies any dysuria, frequency, urgency.  She endorses nausea and vomiting.  She denies any chest pain or shortness of breath.. In the ED, vitals were unremarkable.  Labs were significant for WBC 30.6 and creatinine 1.46.  UA with moderate hemoglobin, moderate leukocytes, rare bacteria, > 50 WBC.  CT abdomen pelvis with 8.5 mm obstructing stone at the right UPJ with severe right-sided hydronephrosis, with at least 4 other nonobstructing stones in the right kidney at least 2 nonobstructing stones in the left kidney.  Urology was consulted and recommended admission for surgery this evening.  Hospitalists were called for admission. Objective/Medical story Patient needs urology evaluation and stent placement for hydronephrosis and obstructive uropathy.  Needs IV antibiotics for sepsis and pyelonephritis. Goals of care determination:  Advance care directives goals of care treatment plan discussed. No patient wants everything done which includes CPR, intubation ventilator if the need arises. CODE STATUS: Full code Time spent discussing advanced care planning: 16 minutes

## 2019-03-04 NOTE — Anesthesia Postprocedure Evaluation (Signed)
Anesthesia Post Note  Patient: Dawn Kane  Procedure(s) Performed: CYSTOSCOPY WITH RETROGRADE PYELOGRAM/URETERAL STENT PLACEMENT (Right )  Patient location during evaluation: PACU Anesthesia Type: General Level of consciousness: awake and alert Pain management: pain level controlled Vital Signs Assessment: post-procedure vital signs reviewed and stable Respiratory status: spontaneous breathing, nonlabored ventilation, respiratory function stable and patient connected to nasal cannula oxygen Cardiovascular status: blood pressure returned to baseline and stable Postop Assessment: no apparent nausea or vomiting Anesthetic complications: no     Last Vitals:  Vitals:   03/03/19 2017 03/03/19 2054  BP: (!) 94/58 105/69  Pulse: 93 84  Resp: 16 18  Temp: 37.2 C 37.9 C  SpO2: 95% 97%    Last Pain:  Vitals:   03/03/19 2107  TempSrc:   PainSc: 0-No pain                 Lenard Simmer

## 2019-03-04 NOTE — Progress Notes (Signed)
Per Carollee Herter PA okay for RN to DC IVF.

## 2019-03-04 NOTE — Progress Notes (Signed)
[facility] [place] [facility]   PATIENT NAME: Dawn Kane    MR#:  [id number]  DATE OF BIRTH:  20-Jun-1971  SUBJECTIVE:  CHIEF COMPLAINT:   Chief Complaint  Patient presents with  . Abdominal Pain  Patient seen today Has decreased right flank pain No nausea or vomiting Patient had a right ureteral stent placed for hydronephrosis by urology Tolerated procedure okay REVIEW OF SYSTEMS:    ROS  CONSTITUTIONAL: No documented fever. No fatigue, weakness. No weight gain, no weight loss.  EYES: No blurry or double vision.  ENT: No tinnitus. No postnasal drip. No redness of the oropharynx.  RESPIRATORY: No cough, no wheeze, no hemoptysis. No dyspnea.  CARDIOVASCULAR: No chest pain. No orthopnea. No palpitations. No syncope.  GASTROINTESTINAL: No nausea, no vomiting or diarrhea. No abdominal pain. No melena or hematochezia.  GENITOURINARY: No dysuria or hematuria.  ENDOCRINE: No polyuria or nocturia. No heat or cold intolerance.  HEMATOLOGY: No anemia. No bruising. No bleeding.  INTEGUMENTARY: No rashes. No lesions.  MUSCULOSKELETAL: No arthritis. No swelling. No gout.  NEUROLOGIC: No numbness, tingling, or ataxia. No seizure-type activity.  PSYCHIATRIC: No anxiety. No insomnia. No ADD.   DRUG ALLERGIES:   Allergies  Allergen Reactions  . Amoxicillin Nausea And Vomiting and Other (See Comments)    Has patient had a PCN reaction causing immediate rash, facial/tongue/throat swelling, SOB or lightheadedness with hypotension: No Has patient had a PCN reaction causing severe rash involving mucus membranes or skin necrosis: No Has patient had a PCN reaction that required hospitalization No Has patient had a PCN reaction occurring within the last 10 years: No If all of the above answers are "NO", then may proceed with Cephalosporin use.   . Aspirin Nausea And Vomiting    VITALS:  Blood pressure 115/82, pulse 79, temperature 98.2 F (36.8 C), temperature source  Oral, resp. rate 18, height 5\' 2"  (1.575 m), weight 56.7 kg, last menstrual period 06/17/2016, SpO2 94 %.  PHYSICAL EXAMINATION:   Physical Exam  GENERAL:  48 y.o.-year-old patient lying in the bed with no acute distress.  EYES: Pupils equal, round, reactive to light and accommodation. No scleral icterus. Extraocular muscles intact.  HEENT: Head atraumatic, normocephalic. Oropharynx and nasopharynx clear.  NECK:  Supple, no jugular venous distention. No thyroid enlargement, no tenderness.  LUNGS: Normal breath sounds bilaterally, no wheezing, rales, rhonchi. No use of accessory muscles of respiration.  CARDIOVASCULAR: S1, S2 normal. No murmurs, rubs, or gallops.  ABDOMEN: Soft, nontender, nondistended. Bowel sounds present. No organomegaly or mass.  EXTREMITIES: No cyanosis, clubbing or edema b/l.    NEUROLOGIC: Cranial nerves II through XII are intact. No focal Motor or sensory deficits b/l.   PSYCHIATRIC: The patient is alert and oriented x 3.  SKIN: No obvious rash, lesion, or ulcer.   LABORATORY PANEL:   CBC Recent Labs  Lab 03/04/19 0448  WBC 38.8*  HGB 13.1  HCT 40.5  PLT 264   ------------------------------------------------------------------------------------------------------------------ Chemistries  Recent Labs  Lab 03/03/19 1208 03/04/19 0448  NA 137 140  K 3.8 4.3  CL 103 108  CO2 24 24  GLUCOSE 185* 189*  BUN 27* 29*  CREATININE 1.46* 1.70*  CALCIUM 9.8 8.9  AST 23  --   ALT 22  --   ALKPHOS 87  --   BILITOT 0.7  --    ------------------------------------------------------------------------------------------------------------------  Cardiac Enzymes No results for input(s): TROPONINI in the last 168 hours. ------------------------------------------------------------------------------------------------------------------  RADIOLOGY:  Dg Abd 1  View  Result Date: 03/04/2019 CLINICAL DATA:  Right ureteral calculus. EXAM: ABDOMEN - 1 VIEW COMPARISON:   Fluoroscopic images of Mar 03, 2019. CT scan of Mar 03, 2019. FINDINGS: The bowel gas pattern is normal. There is been interval placement of right-sided ureteral stent. Proximal right ureteral calculus noted on prior study appears to been pushed back into the right intrarenal collecting system in the lower pole. Status post cholecystectomy. IMPRESSION: Interval placement of right-sided ureteral stent. Proximal right ureteral calculus noted on prior CT scan appears to have been pushed back into right intrarenal collecting system. Electronically Signed   By: Lupita RaiderJames  Green Jr M.D.   On: 03/04/2019 10:13   Dg Abd 1 View  Result Date: 03/03/2019 CLINICAL DATA:  Obstructing RIGHT UPJ calculus. RIGHT double-J stent placement. EXAM: Operative ABDOMEN-1 VIEW COMPARISON:  CT abdomen and pelvis earlier same day. FINDINGS: Single spot image of the abdomen centered over the RIGHT kidney is submitted for interpretation postoperatively. A double-J ureteral stent is present with the proximal loop in an UPPER pole infundibulum of the RIGHT kidney. Contrast material is present within the collecting system. The UPJ calculus identified on CT is not conspicuous and may have been removed or is obscured by contrast material. IMPRESSION: RIGHT double-J ureteral stent placement. Electronically Signed   By: Hulan Saashomas  Lawrence M.D.   On: 03/03/2019 21:00   Ct Abdomen Pelvis W Contrast  Result Date: 03/03/2019 CLINICAL DATA:  Right lower quadrant abdominal pain nausea vomiting beginning this morning. Fever. EXAM: CT ABDOMEN AND PELVIS WITH CONTRAST TECHNIQUE: Multidetector CT imaging of the abdomen and pelvis was performed using the standard protocol following bolus administration of intravenous contrast. CONTRAST:  75mL OMNIPAQUE IOHEXOL 300 MG/ML  SOLN COMPARISON:  None. FINDINGS: Lower chest: Minimal dependent atelectasis is present at the lung bases. No other focal nodule, mass, or airspace disease is present. The heart size is  normal. No significant pleural or pericardial effusion is present. Hepatobiliary: No focal liver abnormality is seen. Status post cholecystectomy. No biliary dilatation. Pancreas: Unremarkable. No pancreatic ductal dilatation or surrounding inflammatory changes. Spleen: Normal in size without focal abnormality. Adrenals/Urinary Tract: Adrenal glands are normal bilaterally. 2 punctate nonobstructing stones are present at the lower pole of the left kidney. The left kidney in ureter otherwise within normal limits. There is no mass lesion or obstruction. Severe right-sided hydronephrosis is secondary to an obstructing 8.5 mm stone at the UPJ. No additional nonobstructing 3 mm stone is present at the lower pole of the right kidney. Three additional punctate nonobstructing stones are present in the right kidney. There is marked stranding about the right kidney without a discrete abscess. There is no evidence for caliceal rupture. The more distal ureter is within normal limits. The urinary bladder is normal. Stomach/Bowel: Stomach is within normal limits. Inflammatory changes about the second and third portion of the duodenum are secondary to the right kidney. Duodenum is otherwise within normal limits. Small bowel is normal. Terminal ileum is within normal limits. The appendix is not discretely visualized and likely surgically absent. Ascending and transverse colon are within normal limits. Distal transverse and descending colon are mostly collapsed. Sigmoid colon is unremarkable. Vascular/Lymphatic: Minimal atherosclerotic changes are present in the aorta and branch vessels. There is no aneurysm. Reproductive: A low-density lesion in the left adnexa measures 5.2 x 3.8 x 3.9 cm. There is a focal calcification within the medial aspect of the lesion. Right adnexa is within normal limits. Uterus is unremarkable. Other: There is retroperitoneal fluid  in the retroperitoneum on the right adjacent to the kidney. No other free  fluid is present. No significant ventral hernia is present. Musculoskeletal: Vertebral body heights and alignment are maintained. Schmorl's nodes are present in the lower thoracic spine. Bony pelvis is within normal limits. The hips are located and normal. IMPRESSION: 1. 8.5 mm obstructing stone at the right UPJ with severe right-sided hydronephrosis. 2. At least 4 other nonobstructing stones in the right kidney measure up to 3 mm. 3. At least 2 punctate nonobstructing stones in the left kidney. 4. 5.2 cm low-density lesion in the left adnexa likely represents a cyst. Focal calcification raises the possibility of a dermoid cyst. Recommend non emergent pelvic ultrasound and GYN follow-up. 5. Mild atherosclerotic changes are present within the aorta and branch vessels. Electronically Signed   By: Marin Roberts M.D.   On: 03/03/2019 14:02   Dg C-arm 1-60 Min  Result Date: 03/03/2019 CLINICAL DATA:  Obstructing RIGHT UPJ calculus. RIGHT double-J stent placement. EXAM: Operative ABDOMEN-1 VIEW COMPARISON:  CT abdomen and pelvis earlier same day. FINDINGS: Single spot image of the abdomen centered over the RIGHT kidney is submitted for interpretation postoperatively. A double-J ureteral stent is present with the proximal loop in an UPPER pole infundibulum of the RIGHT kidney. Contrast material is present within the collecting system. The UPJ calculus identified on CT is not conspicuous and may have been removed or is obscured by contrast material. IMPRESSION: RIGHT double-J ureteral stent placement. Electronically Signed   By: Hulan Saas M.D.   On: 03/03/2019 21:00     ASSESSMENT AND PLAN:   48 year old female patient with history of hypertension, hypothyroidism, hyperlipidemia, COPD currently under hospitalist service for sepsis secondary to obstructing right kidney stone  -Sepsis secondary to obstructive uropathy Discontinue IV Rocephin antibiotic Start IV Zosyn antibiotic Follow-up WBC  count Follow-up urine cultures  -Right-sided obstructive uropathy with severe pyelonephritis and hydronephrosis Status post urology evaluation and stent placement Monitor renal function Monitor creatinine ACE inhibitor and HCTZ on hold  -Acute kidney injury Hold nephrotoxic medication IV fluids Monitor renal function  -Left adnexal mass Incidental finding on CT abdomen Probably a dermoid cyst  -Hyperlipidemia Continue gemfibrozil  -Hypothyroidism Continue oral Synthroid  -Tobacco abuse Tobacco cessation counseled to the patient for 6 minutes Nicotine patch offered  All the records are reviewed and case discussed with Care Management/Social Worker. Management plans discussed with the patient, family and they are in agreement.  CODE STATUS: Full code  DVT Prophylaxis: SCDs  TOTAL TIME TAKING CARE OF THIS PATIENT: 37 minutes.   POSSIBLE D/C IN 2 to 3 DAYS, DEPENDING ON CLINICAL CONDITION.  Ihor Austin M.D on 03/04/2019 at 10:44 AM  Between 7am to 6pm - Pager - [phone]  After 6pm go to [email or web address] - password [email or web address] [facility]  [facility] [place] [facility]  Office  [phone]  CC: Primary care physician; Valerie Roys, FNP  Note: This dictation was prepared with Dragon dictation along with smaller phrase technology. Any transcriptional errors that result from this process are unintentional.

## 2019-03-04 NOTE — Consult Note (Signed)
Pharmacy Antibiotic Note  Dawn Kane is a 48 y.o. female admitted on 03/03/2019 with pyelonepritis.  Pharmacy has been consulted for Zosyn dosing. She was started on Rocephin last night but leukocytosis is worsening. Urology is planning a right ureteral stent  Plan: Zosyn 3.375g IV q8h (4 hour infusion).  Height: 5\' 2"  (157.5 cm) Weight: 125 lb (56.7 kg) IBW/kg (Calculated) : 50.1  Temp (24hrs), Avg:98.7 F (37.1 C), Min:97.7 F (36.5 C), Max:100.3 F (37.9 C)  Recent Labs  Lab 03/03/19 1208 03/04/19 0448  WBC 30.6* 38.8*  CREATININE 1.46* 1.70*    Estimated Creatinine Clearance: 32 mL/min (A) (by C-G formula based on SCr of 1.7 mg/dL (H)).    Allergies  Allergen Reactions  . Amoxicillin Nausea And Vomiting and Other (See Comments)    Has patient had a PCN reaction causing immediate rash, facial/tongue/throat swelling, SOB or lightheadedness with hypotension: No Has patient had a PCN reaction causing severe rash involving mucus membranes or skin necrosis: No Has patient had a PCN reaction that required hospitalization No Has patient had a PCN reaction occurring within the last 10 years: No If all of the above answers are "NO", then may proceed with Cephalosporin use.   . Aspirin Nausea And Vomiting    Antimicrobials this admission: Zosyn 5/18 >>  ceftriaxone 5/17 >> 5/18  Microbiology results: 5/17 UCx: pending   Thank you for allowing pharmacy to be a part of this patient's care.  Lowella Bandy, PharmD 03/04/2019 9:09 AM

## 2019-03-04 NOTE — Telephone Encounter (Signed)
App made and mailed patient was still admitted  [facility]

## 2019-03-05 LAB — BASIC METABOLIC PANEL
Anion gap: 8 (ref 5–15)
BUN: 32 mg/dL — ABNORMAL HIGH (ref 6–20)
CO2: 21 mmol/L — ABNORMAL LOW (ref 22–32)
Calcium: 9 mg/dL (ref 8.9–10.3)
Chloride: 115 mmol/L — ABNORMAL HIGH (ref 98–111)
Creatinine, Ser: 1.63 mg/dL — ABNORMAL HIGH (ref 0.44–1.00)
GFR calc Af Amer: 43 mL/min — ABNORMAL LOW (ref >60)
GFR calc non Af Amer: 37 mL/min — ABNORMAL LOW (ref >60)
Glucose, Bld: 106 mg/dL — ABNORMAL HIGH (ref 70–99)
Potassium: 3.6 mmol/L (ref 3.5–5.1)
Sodium: 144 mmol/L (ref 135–145)

## 2019-03-05 LAB — URINE CULTURE: Culture: 70000 — AB

## 2019-03-05 LAB — CBC
HCT: 36.8 % (ref 36.0–46.0)
Hemoglobin: 11.8 g/dL — ABNORMAL LOW (ref 12.0–15.0)
MCH: 28.7 pg (ref 26.0–34.0)
MCHC: 32.1 g/dL (ref 30.0–36.0)
MCV: 89.5 fL (ref 80.0–100.0)
Platelets: 262 10*3/uL (ref 150–400)
RBC: 4.11 MIL/uL (ref 3.87–5.11)
RDW: 14.3 % (ref 11.5–15.5)
WBC: 29 10*3/uL — ABNORMAL HIGH (ref 4.0–10.5)
nRBC: 0 % (ref 0.0–0.2)

## 2019-03-05 MED ORDER — OXYCODONE-ACETAMINOPHEN 5-325 MG PO TABS: 1.0000 | ORAL_TABLET | Freq: Four times a day (QID) | ORAL | 0 refills | Status: DC | PRN

## 2019-03-05 MED ORDER — CIPROFLOXACIN HCL 500 MG PO TABS: 500.0000 mg | ORAL_TABLET | Freq: Two times a day (BID) | ORAL | 0 refills | Status: AC

## 2019-03-05 NOTE — Discharge Summary (Signed)
[facility] [place] [facility]   PATIENT NAME: Dawn Kane    MR#:  [id number]  DATE OF BIRTH:  11/24/1970  DATE OF ADMISSION:  03/03/2019 ADMITTING PHYSICIAN: Campbell StallKaty Dodd Mayo, MD  DATE OF DISCHARGE: 5/192020  PRIMARY CARE PHYSICIAN: Valerie RoysBrown, Nykedtra Martin, FNP   ADMISSION DIAGNOSIS:  Renal stone [N20.0]  DISCHARGE DIAGNOSIS:  Active Problems:   Pyelonephritis of right kidney Abdominal pain Acute kidney injury Hypertension Left adnexal mass Obstructive uropathy Hydronephrosis SECONDARY DIAGNOSIS:   Past Medical History:  Diagnosis Date  . Anxiety   . COPD (chronic obstructive pulmonary disease) (HCC)   . GERD (gastroesophageal reflux disease)   . Hyperlipidemia   . Hypertension   . Hypothyroidism   . Obesity      ADMITTING HISTORY Dawn Kane  is a 48 y.o. female with a known history of hypertension, hypothyroidism, hyperlipidemia, COPD, and anxiety who presented to the ED with right flank pain, fevers, and chills that started this morning.  She states that the right flank pain is "sharp".  She denies any dysuria, frequency, urgency.  She endorses nausea and vomiting.  She denies any chest pain or shortness of breath.In the ED, vitals were unremarkable.  Labs were significant for WBC 30.6 and creatinine 1.46.  UA with moderate hemoglobin, moderate leukocytes, rare bacteria, > 50 WBC.  CT abdomen pelvis with 8.5 mm obstructing stone at the right UPJ with severe right-sided hydronephrosis, with at least 4 other nonobstructing stones in the right kidney at least 2 nonobstructing stones in the left kidney.  Urology was consulted and recommended admission for surgery this evening.  Hospitalists were called for admission.   HOSPITAL COURSE:  Patient was admitted to medical floor started on IV antibiotics.  Initially Rocephin antibiotic was started and switched to IV Zosyn antibiotic.  Lisinopril and HCTZ were held.  Patient was hydrated with IV fluids for  acute kidney injury.  Kidney functions improved.  Urine cultures grew E. coli and sensitivities were monitored.  Patient was seen by urology for severe hydronephrosis, pyelonephritis and 9 mm right proximal stone.  Patient underwent cystoscopy with right retrograde pyelogram and right ureteral stent placement.  Patient tolerated procedure well.  Her WBC count improved with antibiotics.  Discussed with urology and patient will be discharged home.  Patient will follow-up with urology clinic in the next couple of weeks.  Patient will be discharged on oral ciprofloxacin antibiotic for 2 weeks.  CONSULTS OBTAINED:  Treatment Team:  Jerilee FieldEskridge, Matthew, MD  DRUG ALLERGIES:   Allergies  Allergen Reactions  . Amoxicillin Nausea And Vomiting and Other (See Comments)    Has patient had a PCN reaction causing immediate rash, facial/tongue/throat swelling, SOB or lightheadedness with hypotension: No Has patient had a PCN reaction causing severe rash involving mucus membranes or skin necrosis: No Has patient had a PCN reaction that required hospitalization No Has patient had a PCN reaction occurring within the last 10 years: No If all of the above answers are "NO", then may proceed with Cephalosporin use.   . Aspirin Nausea And Vomiting    DISCHARGE MEDICATIONS:   Allergies as of 03/05/2019      Reactions   Amoxicillin Nausea And Vomiting, Other (See Comments)   Has patient had a PCN reaction causing immediate rash, facial/tongue/throat swelling, SOB or lightheadedness with hypotension: No Has patient had a PCN reaction causing severe rash involving mucus membranes or skin necrosis: No Has patient had a PCN reaction that required hospitalization No Has  patient had a PCN reaction occurring within the last 10 years: No If all of the above answers are "NO", then may proceed with Cephalosporin use.   Aspirin Nausea And Vomiting      Medication List    STOP taking these medications    lisinopril-hydrochlorothiazide 20-25 MG tablet Commonly known as:  ZESTORETIC     TAKE these medications   albuterol 108 (90 Base) MCG/ACT inhaler Commonly known as:  VENTOLIN HFA Inhale 1-2 puffs into the lungs every 6 (six) hours as needed for wheezing or shortness of breath.   ciprofloxacin 500 MG tablet Commonly known as:  Cipro Take 1 tablet (500 mg total) by mouth 2 (two) times daily for 14 days.   gemfibrozil 600 MG tablet Commonly known as:  LOPID Take 600 mg by mouth 2 (two) times daily before a meal.   hydrocortisone 2.5 % cream Apply 1 application topically 2 (two) times daily as needed (for itching).   ibuprofen 200 MG tablet Commonly known as:  ADVIL Take 200-400 mg by mouth every 6 (six) hours as needed for headache or moderate pain.   levothyroxine 175 MCG tablet Commonly known as:  SYNTHROID Take 175 mcg by mouth daily before breakfast.   multivitamin with minerals Tabs tablet Take 1 tablet by mouth daily.   omeprazole 20 MG capsule Commonly known as:  PRILOSEC Take 20 mg by mouth daily as needed (for acid reflux).   oxyCODONE-acetaminophen 5-325 MG tablet Commonly known as:  Percocet Take 1 tablet by mouth every 6 (six) hours as needed for moderate pain or severe pain.       Today  Patient seen and evaluated today Tolerating diet well No fever Hemodynamically stable  VITAL SIGNS:  Blood pressure 128/85, pulse 78, temperature 98.8 F (37.1 C), temperature source Oral, resp. rate 16, height  (1.575 m), weight 56.7 kg, last menstrual period 06/17/2016, SpO2 98 %.  I/O:    Intake/Output Summary (Last 24 hours) at 03/05/2019 1010 Last data filed at 03/05/2019 0726 Gross per 24 hour  Intake 1439.84 ml  Output 3350 ml  Net -1910.16 ml    PHYSICAL EXAMINATION:  Physical Exam  GENERAL:  48 y.o.-year-old patient lying in the bed with no acute distress.  LUNGS: Normal breath sounds bilaterally, no wheezing, rales,rhonchi or crepitation. No  use of accessory muscles of respiration.  CARDIOVASCULAR: S1, S2 normal. No murmurs, rubs, or gallops.  ABDOMEN: Soft, non-tender, non-distended. Bowel sounds present. No organomegaly or mass.  NEUROLOGIC: Moves all 4 extremities. PSYCHIATRIC: The patient is alert and oriented x 3.  SKIN: No obvious rash, lesion, or ulcer.   DATA REVIEW:   CBC Recent Labs  Lab 03/05/19 0307  WBC 29.0*  HGB 11.8*  HCT 36.8  PLT 262    Chemistries  Recent Labs  Lab 03/03/19 1208  03/05/19 0307  NA 137   < > 144  K 3.8   < > 3.6  CL 103   < > 115*  CO2 24   < > 21*  GLUCOSE 185*   < > 106*  BUN 27*   < > 32*  CREATININE 1.46*   < > 1.63*  CALCIUM 9.8   < > 9.0  AST 23  --   --   ALT 22  --   --   ALKPHOS 87  --   --   BILITOT 0.7  --   --    < > = values in this interval not displayed.  Cardiac Enzymes No results for input(s): TROPONINI in the last 168 hours.  Microbiology Results  Results for orders placed or performed during the hospital encounter of 03/03/19  SARS Coronavirus 2 (CEPHEID - Performed in [facility] hospital lab), Hosp Order     Status: None   Collection Time: 03/03/19 12:08 PM  Result Value Ref Range Status   SARS Coronavirus 2 NEGATIVE NEGATIVE Final    Comment: (NOTE) If result is NEGATIVE SARS-CoV-2 target nucleic acids are NOT DETECTED. The SARS-CoV-2 RNA is generally detectable in upper and lower  respiratory specimens during the acute phase of infection. The lowest  concentration of SARS-CoV-2 viral copies this assay can detect is 250  copies / mL. A negative result does not preclude SARS-CoV-2 infection  and should not be used as the sole basis for treatment or other  patient management decisions.  A negative result may occur with  improper specimen collection / handling, submission of specimen other  than nasopharyngeal swab, presence of viral mutation(s) within the  areas targeted by this assay, and inadequate number of viral copies  (<250 copies /  mL). A negative result must be combined with clinical  observations, patient history, and epidemiological information. If result is POSITIVE SARS-CoV-2 target nucleic acids are DETECTED. The SARS-CoV-2 RNA is generally detectable in upper and lower  respiratory specimens dur ing the acute phase of infection.  Positive  results are indicative of active infection with SARS-CoV-2.  Clinical  correlation with patient history and other diagnostic information is  necessary to determine patient infection status.  Positive results do  not rule out bacterial infection or co-infection with other viruses. If result is PRESUMPTIVE POSTIVE SARS-CoV-2 nucleic acids MAY BE PRESENT.   A presumptive positive result was obtained on the submitted specimen  and confirmed on repeat testing.  While 2019 novel coronavirus  (SARS-CoV-2) nucleic acids may be present in the submitted sample  additional confirmatory testing may be necessary for epidemiological  and / or clinical management purposes  to differentiate between  SARS-CoV-2 and other Sarbecovirus currently known to infect humans.  If clinically indicated additional testing with an alternate test  methodology [id number]) is advised. The SARS-CoV-2 RNA is generally  detectable in upper and lower respiratory sp ecimens during the acute  phase of infection. The expected result is Negative. Fact Sheet for Patients:  BoilerBrush.com.cy Fact Sheet for Healthcare Providers: https://pope.com/ This test is not yet approved or cleared by the [place] FDA and has been authorized for detection and/or diagnosis of SARS-CoV-2 by FDA under an Emergency Use Authorization (EUA).  This EUA will remain in effect (meaning this test can be used) for the duration of the COVID-19 declaration under Section 564(b)(1) of the Act, 21 U.S.C. section 360bbb-3(b)(1), unless the authorization is terminated or revoked  sooner. Performed at [facility], [address], [place], [place] [address]   Urine Culture     Status: Abnormal   Collection Time: 03/03/19  6:13 PM  Result Value Ref Range Status   Specimen Description   Final    URINE, RANDOM Performed at [facility], [address], [place], [place] [address]    Special Requests   Final    NONE Performed at [facility], [address] [place], [place] [address]    Culture 70,000 COLONIES/mL ESCHERICHIA COLI (A)  Final   Report Status 03/05/2019 FINAL  Final   Organism ID, Bacteria ESCHERICHIA COLI (A)  Final      Susceptibility  Escherichia coli - MIC*    AMPICILLIN 4 SENSITIVE Sensitive     CEFAZOLIN <=4 SENSITIVE Sensitive     CEFTRIAXONE <=1 SENSITIVE Sensitive     CIPROFLOXACIN <=0.25 SENSITIVE Sensitive     GENTAMICIN <=1 SENSITIVE Sensitive     IMIPENEM <=0.25 SENSITIVE Sensitive     NITROFURANTOIN <=16 SENSITIVE Sensitive     TRIMETH/SULFA <=20 SENSITIVE Sensitive     AMPICILLIN/SULBACTAM <=2 SENSITIVE Sensitive     PIP/TAZO <=4 SENSITIVE Sensitive     Extended ESBL NEGATIVE Sensitive     * 70,000 COLONIES/mL ESCHERICHIA COLI    RADIOLOGY:  Dg Abd 1 View  Result Date: 03/04/2019 CLINICAL DATA:  Right ureteral calculus. EXAM: ABDOMEN - 1 VIEW COMPARISON:  Fluoroscopic images of Mar 03, 2019. CT scan of Mar 03, 2019. FINDINGS: The bowel gas pattern is normal. There is been interval placement of right-sided ureteral stent. Proximal right ureteral calculus noted on prior study appears to been pushed back into the right intrarenal collecting system in the lower pole. Status post cholecystectomy. IMPRESSION: Interval placement of right-sided ureteral stent. Proximal right ureteral calculus noted on prior CT scan appears to have been pushed back into right intrarenal collecting system. Electronically Signed   By: Lupita Raider M.D.   On: 03/04/2019 10:13   Dg Abd 1 View  Result Date:  03/03/2019 CLINICAL DATA:  Obstructing RIGHT UPJ calculus. RIGHT double-J stent placement. EXAM: Operative ABDOMEN-1 VIEW COMPARISON:  CT abdomen and pelvis earlier same day. FINDINGS: Single spot image of the abdomen centered over the RIGHT kidney is submitted for interpretation postoperatively. A double-J ureteral stent is present with the proximal loop in an UPPER pole infundibulum of the RIGHT kidney. Contrast material is present within the collecting system. The UPJ calculus identified on CT is not conspicuous and may have been removed or is obscured by contrast material. IMPRESSION: RIGHT double-J ureteral stent placement. Electronically Signed   By: Hulan Saas M.D.   On: 03/03/2019 21:00   Ct Abdomen Pelvis W Contrast  Result Date: 03/03/2019 CLINICAL DATA:  Right lower quadrant abdominal pain nausea vomiting beginning this morning. Fever. EXAM: CT ABDOMEN AND PELVIS WITH CONTRAST TECHNIQUE: Multidetector CT imaging of the abdomen and pelvis was performed using the standard protocol following bolus administration of intravenous contrast. CONTRAST:  75mL OMNIPAQUE IOHEXOL 300 MG/ML  SOLN COMPARISON:  None. FINDINGS: Lower chest: Minimal dependent atelectasis is present at the lung bases. No other focal nodule, mass, or airspace disease is present. The heart size is normal. No significant pleural or pericardial effusion is present. Hepatobiliary: No focal liver abnormality is seen. Status post cholecystectomy. No biliary dilatation. Pancreas: Unremarkable. No pancreatic ductal dilatation or surrounding inflammatory changes. Spleen: Normal in size without focal abnormality. Adrenals/Urinary Tract: Adrenal glands are normal bilaterally. 2 punctate nonobstructing stones are present at the lower pole of the left kidney. The left kidney in ureter otherwise within normal limits. There is no mass lesion or obstruction. Severe right-sided hydronephrosis is secondary to an obstructing 8.5 mm stone at the UPJ.  No additional nonobstructing 3 mm stone is present at the lower pole of the right kidney. Three additional punctate nonobstructing stones are present in the right kidney. There is marked stranding about the right kidney without a discrete abscess. There is no evidence for caliceal rupture. The more distal ureter is within normal limits. The urinary bladder is normal. Stomach/Bowel: Stomach is within normal limits. Inflammatory changes about the second and third portion of the duodenum are  secondary to the right kidney. Duodenum is otherwise within normal limits. Small bowel is normal. Terminal ileum is within normal limits. The appendix is not discretely visualized and likely surgically absent. Ascending and transverse colon are within normal limits. Distal transverse and descending colon are mostly collapsed. Sigmoid colon is unremarkable. Vascular/Lymphatic: Minimal atherosclerotic changes are present in the aorta and branch vessels. There is no aneurysm. Reproductive: A low-density lesion in the left adnexa measures 5.2 x 3.8 x 3.9 cm. There is a focal calcification within the medial aspect of the lesion. Right adnexa is within normal limits. Uterus is unremarkable. Other: There is retroperitoneal fluid in the retroperitoneum on the right adjacent to the kidney. No other free fluid is present. No significant ventral hernia is present. Musculoskeletal: Vertebral body heights and alignment are maintained. Schmorl's nodes are present in the lower thoracic spine. Bony pelvis is within normal limits. The hips are located and normal. IMPRESSION: 1. 8.5 mm obstructing stone at the right UPJ with severe right-sided hydronephrosis. 2. At least 4 other nonobstructing stones in the right kidney measure up to 3 mm. 3. At least 2 punctate nonobstructing stones in the left kidney. 4. 5.2 cm low-density lesion in the left adnexa likely represents a cyst. Focal calcification raises the possibility of a dermoid cyst. Recommend  non emergent pelvic ultrasound and GYN follow-up. 5. Mild atherosclerotic changes are present within the aorta and branch vessels. Electronically Signed   By: Marin Roberts M.D.   On: 03/03/2019 14:02   Dg C-arm 1-60 Min  Result Date: 03/03/2019 CLINICAL DATA:  Obstructing RIGHT UPJ calculus. RIGHT double-J stent placement. EXAM: Operative ABDOMEN-1 VIEW COMPARISON:  CT abdomen and pelvis earlier same day. FINDINGS: Single spot image of the abdomen centered over the RIGHT kidney is submitted for interpretation postoperatively. A double-J ureteral stent is present with the proximal loop in an UPPER pole infundibulum of the RIGHT kidney. Contrast material is present within the collecting system. The UPJ calculus identified on CT is not conspicuous and may have been removed or is obscured by contrast material. IMPRESSION: RIGHT double-J ureteral stent placement. Electronically Signed   By: Hulan Saas M.D.   On: 03/03/2019 21:00    Follow up with PCP in 1 week.  Management plans discussed with the patient, family and they are in agreement.  CODE STATUS: Full code    Code Status Orders  (From admission, onward)         Start     Ordered   03/03/19 1813  Full code  Continuous     03/03/19 1812        Code Status History    Date Active Date Inactive Code Status Order ID Comments User Context   07/09/2016 0239 07/11/2016 1320 Full Code [id number]  Oralia Manis, MD Inpatient      TOTAL TIME TAKING CARE OF THIS PATIENT ON DAY OF DISCHARGE: more than 35 minutes.   Ihor Austin M.D on 03/05/2019 at 10:10 AM  Between 7am to 6pm - Pager - [phone]  After 6pm go to [email or web address] - password [email or web address] [facility]  [facility]  Office  [phone]  CC: Primary care physician; Valerie Roys, FNP  Note: This dictation was prepared with Dragon dictation along with smaller phrase technology. Any transcriptional errors that result from this process are  unintentional.

## 2019-03-05 NOTE — Progress Notes (Signed)
[name]  A and O x 4. VSS. Pt tolerating diet well. No complaints of pain or nausea. IV removed intact, prescriptions given. Pt voiced understanding of discharge instructions with no further questions. Pt discharged via wheelchair with axillary.    Allergies as of 03/05/2019      Reactions   Amoxicillin Nausea And Vomiting, Other (See Comments)   Has patient had a PCN reaction causing immediate rash, facial/tongue/throat swelling, SOB or lightheadedness with hypotension: No Has patient had a PCN reaction causing severe rash involving mucus membranes or skin necrosis: No Has patient had a PCN reaction that required hospitalization No Has patient had a PCN reaction occurring within the last 10 years: No If all of the above answers are "NO", then may proceed with Cephalosporin use.   Aspirin Nausea And Vomiting      Medication List    STOP taking these medications   lisinopril-hydrochlorothiazide 20-25 MG tablet Commonly known as:  ZESTORETIC     TAKE these medications   albuterol 108 (90 Base) MCG/ACT inhaler Commonly known as:  VENTOLIN HFA Inhale 1-2 puffs into the lungs every 6 (six) hours as needed for wheezing or shortness of breath.   ciprofloxacin 500 MG tablet Commonly known as:  Cipro Take 1 tablet (500 mg total) by mouth 2 (two) times daily for 14 days.   gemfibrozil 600 MG tablet Commonly known as:  LOPID Take 600 mg by mouth 2 (two) times daily before a meal.   hydrocortisone 2.5 % cream Apply 1 application topically 2 (two) times daily as needed (for itching).   ibuprofen 200 MG tablet Commonly known as:  ADVIL Take 200-400 mg by mouth every 6 (six) hours as needed for headache or moderate pain.   levothyroxine 175 MCG tablet Commonly known as:  SYNTHROID Take 175 mcg by mouth daily before breakfast.   multivitamin with minerals Tabs tablet Take 1 tablet by mouth daily.   omeprazole 20 MG capsule Commonly known as:  PRILOSEC Take 20 mg by mouth  daily as needed (for acid reflux).   oxyCODONE-acetaminophen 5-325 MG tablet Commonly known as:  Percocet Take 1 tablet by mouth every 6 (six) hours as needed for moderate pain or severe pain.       Vitals:   03/04/19 2003 03/05/19 0504  BP: 131/77 128/85  Pulse: 79 78  Resp: 17 16  Temp: 98.5 F (36.9 C) 98.8 F (37.1 C)  SpO2: 97% 98%    Suzzanne Cloud

## 2019-03-05 NOTE — Progress Notes (Signed)
[facility] [place] at [facility] Dawn Kane was admitted to the Hospital on 03/03/2019 and Discharged  03/05/2019 and should be excused from work/school from 03/03/2019 to 03/07/2019.  Call Ihor Austin MD with questions.  Ihor Austin M.D on 03/05/2019,at 10:09 AM  [facility] - [place] at [facility]  [phone]

## 2019-03-05 NOTE — TOC Transition Note (Signed)
Transition of Care [facility]) - CM/SW Discharge Note   Patient Details  Name: DOREEN PILARCZYK MRN: [id number] Date of Birth: [birth year]  Transition of Care [facility]) CM/SW Contact:  Chapman Fitch, RN Phone Number: 03/05/2019, 11:01 AM   Clinical Narrative:    Patient to discharge today.  Patient was admitted with pyelonephritis. PCP FNP Manson Passey at [facility].  Patient states "social Services" set her up with the provider.  Patient states that her copay is $20 per visit and denies any issues paying for visits.   Patient to discharge with Rx for percocet and cipro.  Goodrx coupons printed for [facility] Cipro $24.16.  Percocet $10.39 .  Patient denies issues obtaining this medication. RNCM offered to print coupons for pharmacy that was less expensive, Patient politely declines   Final next level of care: Home/Self Care Barriers to Discharge: Barriers Resolved   Patient Goals and CMS Choice        Discharge Placement                       Discharge Plan and Services   Discharge Planning Services: CM Consult, Medication Assistance                                 Social Determinants of Health (SDOH) Interventions     Readmission Risk Interventions No flowsheet data found.

## 2019-03-07 ENCOUNTER — Telehealth: Payer: Self-pay | Admitting: Urology

## 2019-03-22 ENCOUNTER — Encounter: Payer: Self-pay | Admitting: Urology

## 2019-03-22 ENCOUNTER — Telehealth: Payer: Self-pay | Admitting: Radiology

## 2019-03-22 ENCOUNTER — Ambulatory Visit (INDEPENDENT_AMBULATORY_CARE_PROVIDER_SITE_OTHER): Payer: No Typology Code available for payment source | Admitting: Urology

## 2019-03-22 ENCOUNTER — Other Ambulatory Visit: Payer: Self-pay | Admitting: Radiology

## 2019-03-22 ENCOUNTER — Other Ambulatory Visit: Payer: Self-pay

## 2019-03-22 VITALS — BP 133/81 | HR 84 | Ht 62.0 in

## 2019-03-22 DIAGNOSIS — R350 Frequency of micturition: Secondary | ICD-10-CM

## 2019-03-22 DIAGNOSIS — N200 Calculus of kidney: Secondary | ICD-10-CM

## 2019-03-22 LAB — URINALYSIS, COMPLETE
Bilirubin, UA: NEGATIVE
Glucose, UA: NEGATIVE
Ketones, UA: NEGATIVE
Nitrite, UA: NEGATIVE
Specific Gravity, UA: 1.02 (ref 1.005–1.030)
Urobilinogen, Ur: 0.2 mg/dL (ref 0.2–1.0)
pH, UA: 7 (ref 5.0–7.5)

## 2019-03-22 LAB — MICROSCOPIC EXAMINATION
Bacteria, UA: NONE SEEN
RBC, Urine: >30 /hpf — AB (ref 0–2)

## 2019-03-22 MED ORDER — CEPHALEXIN 500 MG PO CAPS: 500.0000 mg | ORAL_CAPSULE | Freq: Every day | ORAL | 0 refills | Status: AC

## 2019-03-22 MED ORDER — TAMSULOSIN HCL 0.4 MG PO CAPS: 0.4000 mg | ORAL_CAPSULE | Freq: Every day | ORAL | 0 refills | Status: DC

## 2019-03-22 NOTE — Telephone Encounter (Signed)
Patient was given the [facility] Surgery Information form below as well as the Instructions for Pre-Admission Testing form & a map of [facility].   [address], [address] [place], [place] [address] Telephone: [phone] Fax: [phone]   Thank you for choosing [facility] for your upcoming surgery!  We are always here to assist in your urological needs.  Please read the following information with specific details for your upcoming appointments related to your surgery. Please contact Tia[place]na Buford at [phone] Option 3 with any questions.  The Name of Your Surgery: right ureteroscopy, laser lithotripsy, stone removal, ureteral stent placement Your Surgery Date: 03/29/2019 Your Surgeon: Legrand Rams   Please call Same Day Surgery at [phone] between the hours of 1pm-3pm one day prior to your surgery. They will inform you of the time to arrive at Same Day Surgery which is located on the second floor of the [facility].   Please refer to the attached letter regarding instructions for Pre-Admission Testing. You will receive a call from the Pre-Admission Testing office regarding your appointment with them.  The Pre-Admission Testing office is located at [address] [facility], on the first floor of the [facility] in [address] (office is to the right as you enter through the [employer] of the [employer]). Please have all medications you are currently taking and your insurance card available.  Advised patient to get COVID-19 test on 03/26/2019 & that anesthesia nurse will call with instructions for surgery on 03/26/2019. Patient was advised to have nothing to eat or drink after midnight the night prior to surgery except that she may have only water until 2 hours before surgery with nothing to drink within 2 hours of surgery.  The patient states she currently  takes no blood thinners. Patient's questions were answered and she expressed understanding of these instructions.

## 2019-03-22 NOTE — Progress Notes (Signed)
03/22/2019 2:31 PM   Dawn Kane June 07, 1971 [id number]  Referring provider: Valerie Roys, FNP [address] [place], [place] [address]  Chief Complaint  Patient presents with  . Nephrolithiasis    HPI: [name] returns in management of right renal stone.  She was stented urgently Mar 03, 2019 for an 8 to 9 mm right proximal ureteral stone with severe hydro-and a white count of 30.  She did well.  The stone was pushed back into the kidney on fluoroscopy and follow-up KUB.  Stent was in good position.  Urine culture grew 70,000 colonies of a pansensitive E. Coli. She's felt sick and dizzy at work on the Cipro. She is taking one Cipro at night and wants to switch,. She has a lot of pressure when she voids. And bladder spasm, frequency. Her UA is clear today - no wbc or bacteria. > 30 rbc as expected.   This is her first stone event.   PMH: Past Medical History:  Diagnosis Date  . Anxiety   . COPD (chronic obstructive pulmonary disease) (HCC)   . GERD (gastroesophageal reflux disease)   . Hyperlipidemia   . Hypertension   . Hypothyroidism   . Obesity     Surgical History: Past Surgical History:  Procedure Laterality Date  . CLAVICLE SURGERY    . COLONOSCOPY WITH PROPOFOL N/A 07/04/2018   Procedure: COLONOSCOPY WITH PROPOFOL;  Surgeon: Kathi Der, MD;  Location: [facility];  Service: Gastroenterology;  Laterality: N/A;  . CYSTOSCOPY W/ URETERAL STENT PLACEMENT Right 03/03/2019   Procedure: CYSTOSCOPY WITH RETROGRADE PYELOGRAM/URETERAL STENT PLACEMENT;  Surgeon: Jerilee Field, MD;  Location: [facility];  Service: Urology;  Laterality: Right;  . LUNG SURGERY    . MANDIBLE SURGERY    . POLYPECTOMY  07/04/2018   Procedure: POLYPECTOMY;  Surgeon: Kathi Der, MD;  Location: [facility];  Service: Gastroenterology;;    Home Medications:  Allergies as of 03/22/2019      Reactions   Amoxicillin Nausea And Vomiting, Other (See Comments)   Has patient  had a PCN reaction causing immediate rash, facial/tongue/throat swelling, SOB or lightheadedness with hypotension: No Has patient had a PCN reaction causing severe rash involving mucus membranes or skin necrosis: No Has patient had a PCN reaction that required hospitalization No Has patient had a PCN reaction occurring within the last 10 years: No If all of the above answers are "NO", then may proceed with Cephalosporin use.   Aspirin Nausea And Vomiting      Medication List       Accurate as of March 22, 2019  2:31 PM. If you have any questions, ask your nurse or doctor.        STOP taking these medications   gemfibrozil 600 MG tablet Commonly known as:  LOPID Stopped by:  Jerilee Field, MD   hydrocortisone 2.5 % cream Stopped by:  Jerilee Field, MD     TAKE these medications   albuterol 108 (90 Base) MCG/ACT inhaler Commonly known as:  VENTOLIN HFA Inhale 1-2 puffs into the lungs every 6 (six) hours as needed for wheezing or shortness of breath.   ciprofloxacin 500 MG tablet Commonly known as:  CIPRO Take 500 mg by mouth 2 (two) times daily.   ibuprofen 200 MG tablet Commonly known as:  ADVIL Take 200-400 mg by mouth every 6 (six) hours as needed for headache or moderate pain.   levothyroxine 175 MCG tablet Commonly known as:  SYNTHROID Take 175 mcg by  mouth daily before breakfast.   multivitamin with minerals Tabs tablet Take 1 tablet by mouth daily.   omeprazole 20 MG capsule Commonly known as:  PRILOSEC Take 20 mg by mouth daily as needed (for acid reflux).   oxyCODONE-acetaminophen 5-325 MG tablet Commonly known as:  Percocet Take 1 tablet by mouth every 6 (six) hours as needed for moderate pain or severe pain.       Allergies:  Allergies  Allergen Reactions  . Amoxicillin Nausea And Vomiting and Other (See Comments)    Has patient had a PCN reaction causing immediate rash, facial/tongue/throat swelling, SOB or lightheadedness with hypotension: No  Has patient had a PCN reaction causing severe rash involving mucus membranes or skin necrosis: No Has patient had a PCN reaction that required hospitalization No Has patient had a PCN reaction occurring within the last 10 years: No If all of the above answers are "NO", then may proceed with Cephalosporin use.   . Aspirin Nausea And Vomiting    Family History: Family History  Problem Relation Age of Onset  . Heart disease Paternal Grandmother   . Cancer Paternal Grandfather        pancreatic    Social History:  reports that she has been smoking cigarettes. She has been smoking about 1.50 packs per day. She has never used smokeless tobacco. She reports current drug use. Frequency: 3.00 times per week. Drug: Marijuana. She reports that she does not drink alcohol.  ROS: UROLOGY Frequent Urination?: No Hard to postpone urination?: No Burning/pain with urination?: No Get up at night to urinate?: No Leakage of urine?: No Urine stream starts and stops?: No Trouble starting stream?: No Do you have to strain to urinate?: No Blood in urine?: No Urinary tract infection?: Yes Sexually transmitted disease?: No Injury to kidneys or bladder?: No Painful intercourse?: No Weak stream?: No Currently pregnant?: No Vaginal bleeding?: No Last menstrual period?: n  Gastrointestinal Nausea?: No Vomiting?: No Indigestion/heartburn?: No Diarrhea?: No Constipation?: No  Constitutional Fever: No Night sweats?: No Weight loss?: No Fatigue?: No  Skin Skin rash/lesions?: No Itching?: No  Eyes Blurred vision?: No Double vision?: No  Ears/Nose/Throat Sore throat?: No Sinus problems?: No  Hematologic/Lymphatic Swollen glands?: No Easy bruising?: No  Cardiovascular Leg swelling?: No Chest pain?: No  Respiratory Cough?: No Shortness of breath?: No  Endocrine Excessive thirst?: No  Musculoskeletal Back pain?: No Joint pain?: No  Neurological Headaches?: No Dizziness?:  No  Psychologic Depression?: No Anxiety?: No  Physical Exam: BP 133/81   Pulse 84   Ht 5\' 2"  (1.575 m)   LMP 06/17/2016 Comment: neg preg test  BMI 22.86 kg/m   Constitutional:  Alert and oriented, No acute distress. She looks well.  HEENT: [place] AT, moist mucus membranes.  Trachea midline, no masses. Cardiovascular: No clubbing, cyanosis, or edema. Respiratory: Normal respiratory effort, no increased work of breathing. GI: Abdomen is soft, nontender, nondistended, no abdominal masses GU: No CVA tenderness Skin: No rashes, bruises or suspicious lesions. Neurologic: Grossly intact, no focal deficits, moving all 4 extremities. Psychiatric: Normal mood and affect.  Laboratory Data: Lab Results  Component Value Date   WBC 29.0 (H) 03/05/2019   HGB 11.8 (L) 03/05/2019   HCT 36.8 03/05/2019   MCV 89.5 03/05/2019   PLT 262 03/05/2019    Lab Results  Component Value Date   CREATININE 1.63 (H) 03/05/2019    No results found for: PSA  No results found for: TESTOSTERONE  No results found for: HGBA1C  Urinalysis    Component Value Date/Time   COLORURINE YELLOW (A) 03/03/2019 1210   APPEARANCEUR HAZY (A) 03/03/2019 1210   LABSPEC 1.018 03/03/2019 1210   PHURINE 7.0 03/03/2019 1210   GLUCOSEU NEGATIVE 03/03/2019 1210   HGBUR MODERATE (A) 03/03/2019 1210   BILIRUBINUR NEGATIVE 03/03/2019 1210   KETONESUR NEGATIVE 03/03/2019 1210   PROTEINUR 30 (A) 03/03/2019 1210   NITRITE NEGATIVE 03/03/2019 1210   LEUKOCYTESUR MODERATE (A) 03/03/2019 1210    Lab Results  Component Value Date   BACTERIA RARE (A) 03/03/2019    Pertinent Imaging: CT, kub  Results for orders placed during the hospital encounter of 03/03/19  DG Abd 1 View   Narrative CLINICAL DATA:  Right ureteral calculus.  EXAM: ABDOMEN - 1 VIEW  COMPARISON:  Fluoroscopic images of Mar 03, 2019. CT scan of Mar 03, 2019.  FINDINGS: The bowel gas pattern is normal. There is been interval placement of  right-sided ureteral stent. Proximal right ureteral calculus noted on prior study appears to been pushed back into the right intrarenal collecting system in the lower pole. Status post cholecystectomy.  IMPRESSION: Interval placement of right-sided ureteral stent. Proximal right ureteral calculus noted on prior CT scan appears to have been pushed back into right intrarenal collecting system.   Electronically Signed   By: Lupita Raider M.D.   On: 03/04/2019 10:13    No results found for this or any previous visit. No results found for this or any previous visit. No results found for this or any previous visit. No results found for this or any previous visit. No results found for this or any previous visit. No results found for this or any previous visit. No results found for this or any previous visit.  Assessment & Plan:    Right renal stone -- I drew her a picture of the anatomy.  We discussed again the rationale for a staged procedure.  We did not treat the stone upfront given the infection.  We also discussed currently simply removing the stent now that the stone is back in the kidney or proceeding with shockwave lithotripsy or ureteroscopy.  All questions answered.  She will proceed with ureteroscopy with laser lithotripsy and stent exchange.  Also discussed 1 of my colleagues will be doing the procedure.  UTI -- change to nightly cephalexin to prevent another UTI. She has bladder spasm from stent and I sent tamsulosin.   No follow-ups on file.  Jerilee Field, MD  [facility] [address], [address] [place], [place] [address] [phone]

## 2019-03-22 NOTE — Patient Instructions (Signed)
Ureteroscopy, Care After This sheet gives you information about how to care for yourself after your procedure. Your health care provider may also give you more specific instructions. If you have problems or questions, contact your health care provider. What can I expect after the procedure? After the procedure, it is common to have:  A burning sensation when you urinate.  Blood in your urine.  Mild discomfort in the bladder area or kidney area when urinating.  Needing to urinate more often or urgently. Follow these instructions at home:  Medicines  Take over-the-counter and prescription medicines only as told by your health care provider.  If you were prescribed an antibiotic medicine, take it as told by your health care provider. Do not stop taking the antibiotic even if you start to feel better. General instructions  Donot drive for 24 hours if you were given a medicine to help you relax (sedative) during your procedure.  To relieve burning, try taking a warm bath or holding a warm washcloth over your groin.  Drink enough fluid to keep your urine clear or pale yellow. ? Drink two 8-ounce glasses of water every hour for the first 2 hours after you get home. ? Continue to drink water often at home.  You can eat what you usually do.  Keep all follow-up visits as told by your health care provider. This is important. ? If you had a tube placed to keep urine flowing (ureteral stent), ask your health care provider when you need to return to have it removed. Contact a health care provider if:  You have chills or a fever.  You have burning pain for longer than 24 hours after the procedure.  You have blood in your urine for longer than 24 hours after the procedure. Get help right away if:  You have large amounts of blood in your urine.  You have blood clots in your urine.  You have very bad pain.  You have chest pain or trouble breathing.  You are unable to urinate and you  have the feeling of a full bladder. This information is not intended to replace advice given to you by your health care provider. Make sure you discuss any questions you have with your health care provider. Document Released: 10/08/2013 Document Revised: 07/19/2016 Document Reviewed: 07/15/2016 Elsevier Interactive Patient Education  2019 [employer] Ureteral Stent Implantation, Care After Refer to this sheet in the next few weeks. These instructions provide you with information about caring for yourself after your procedure. Your health care provider may also give you more specific instructions. Your treatment has been planned according to current medical practices, but problems sometimes occur. Call your health care provider if you have any problems or questions after your procedure. What can I expect after the procedure? After the procedure, it is common to have:  Nausea.  Mild pain when you urinate. You may feel this pain in your lower back or lower abdomen. Pain should stop within a few minutes after you urinate. This may last for up to 1 week.  A small amount of blood in your urine for several days. Follow these instructions at home:  Medicines  Take over-the-counter and prescription medicines only as told by your health care provider.  If you were prescribed an antibiotic medicine, take it as told by your health care provider. Do not stop taking the antibiotic even if you start to feel better.  Do not drive for 24 hours if you received a sedative.  Do not drive or operate heavy machinery while taking prescription pain medicines. Activity  Return to your normal activities as told by your health care provider. Ask your health care provider what activities are safe for you.  Do not lift anything that is heavier than 10 lb (4.5 kg). Follow this limit for 1 week after your procedure, or for as long as told by your health care provider. General instructions  Watch for any blood in  your urine. Call your health care provider if the amount of blood in your urine increases.  If you have a catheter: ? Follow instructions from your health care provider about taking care of your catheter and collection bag. ? Do not take baths, swim, or use a hot tub until your health care provider approves.  Drink enough fluid to keep your urine clear or pale yellow.  Keep all follow-up visits as told by your health care provider. This is important. Contact a health care provider if:  You have pain that gets worse or does not get better with medicine, especially pain when you urinate.  You have difficulty urinating.  You feel nauseous or you vomit repeatedly during a period of more than 2 days after the procedure. Get help right away if:  Your urine is dark red or has blood clots in it.  You are leaking urine (have incontinence).  The end of the stent comes out of your urethra.  You cannot urinate.  You have sudden, sharp, or severe pain in your abdomen or lower back.  You have a fever. This information is not intended to replace advice given to you by your health care provider. Make sure you discuss any questions you have with your health care provider. Document Released: 06/05/2013 Document Revised: 03/10/2016 Document Reviewed: 04/17/2015 Elsevier Interactive Patient Education  2019 [employer]

## 2019-03-25 ENCOUNTER — Telehealth: Payer: Self-pay | Admitting: Urology

## 2019-03-25 ENCOUNTER — Telehealth: Payer: Self-pay | Admitting: Radiology

## 2019-03-25 ENCOUNTER — Other Ambulatory Visit: Payer: Self-pay | Admitting: Urology

## 2019-03-25 MED ORDER — OXYCODONE-ACETAMINOPHEN 5-325 MG PO TABS: 1.0000 | ORAL_TABLET | Freq: Four times a day (QID) | ORAL | 0 refills | Status: DC | PRN

## 2019-03-25 NOTE — Telephone Encounter (Signed)
Patient LMOM stating she can't afford tamsulosin & asks for a refill of Percocet. Please advise.

## 2019-03-25 NOTE — Telephone Encounter (Signed)
Notified patient of script for pain medication sent to pharmacy. Patient says she can't go to [facility] to fill script for tamsulosin.

## 2019-03-25 NOTE — Telephone Encounter (Signed)
I looked on GoodRx and she can get 30 days of the tamsulosin at [facility] in [place] for $12.  Would she be able to get there for the medication?

## 2019-03-26 ENCOUNTER — Other Ambulatory Visit: Payer: Self-pay

## 2019-03-26 ENCOUNTER — Other Ambulatory Visit
Admission: RE | Admit: 2019-03-26 | Discharge: 2019-03-26 | Disposition: A | Discharge status: Home / Self Care | Payer: Self-pay | Source: Ambulatory Visit | Attending: Urology | Admitting: Urology

## 2019-03-26 NOTE — Patient Instructions (Addendum)
Your procedure is scheduled on: 03-29-19 FRIDAY Report to Same Day Surgery 2nd floor medical mall [facility] Entrance-take elevator on left to 2nd floor.  Check in with surgery information desk.) To find out your arrival time please call [phone] between 1PM - 3PM on 03-28-19 THURSDAY  Remember: Instructions that are not followed completely may result in serious medical risk, up to and including death, or upon the discretion of your surgeon and anesthesiologist your surgery may need to be rescheduled.    _x___ 1. Do not eat food after midnight the night before your procedure. NO GUM OR CANDY AFTER MIDNIGHT. You may drink clear liquids up to 2 hours before you are scheduled to arrive at the hospital for your procedure.  Do not drink clear liquids within 2 hours of your scheduled arrival to the hospital.  Clear liquids include  --Water or Apple juice without pulp  --Clear carbohydrate beverage such as ClearFast or Gatorade  --Black Coffee or Clear Tea (No milk, no creamers, do not add anything to the coffee or Tea   ____Ensure clear carbohydrate drink on the way to the hospital for bariatric patients  ____Ensure clear carbohydrate drink 3 hours before surgery for Dr Dwyane Luo patients if physician instructed.    __x__ 2. No Alcohol for 24 hours before or after surgery.   __x__3. No Smoking or e-cigarettes for 24 prior to surgery.  Do not use any chewable tobacco products for at least 6 hour prior to surgery   ____  4. Bring all medications with you on the day of surgery if instructed.    __x__ 5. Notify your doctor if there is any change in your medical condition     (cold, fever, infections).    x___6. On the morning of surgery brush your teeth with toothpaste and water.  You may rinse your mouth with mouth wash if you wish.  Do not swallow any toothpaste or mouthwash.   Do not wear jewelry, make-up, hairpins, clips or nail polish.  Do not wear lotions, powders, or perfumes. You  may wear deodorant.  Do not shave 48 hours prior to surgery. Men may shave face and neck.  Do not bring valuables to the hospital.    [facility] is not responsible for any belongings or valuables.               Contacts, dentures or bridgework may not be worn into surgery.  Leave your suitcase in the car. After surgery it may be brought to your room.  For patients admitted to the hospital, discharge time is determined by your treatment team.  _  Patients discharged the day of surgery will not be allowed to drive home.  You will need someone to drive you home and stay with you the night of your procedure.    Please read over the following fact sheets that you were given:   [facility] Preparing for Surgery and or MRSA Information   _x___ TAKE THE FOLLOWING MEDICATION THE MORNING OF SURGERY WITH A SMALL SIP OF WATER. These include:  1. LEVOTHYROXINE  2. OMEPRAZOLE  3. TAKE AN OMEPRAZOLE THE NIGHT BEFORE YOUR SURGERY  4.  5.  6.  ____Fleets enema or Magnesium Citrate as directed.   ____ Use CHG Soap or sage wipes as directed on instruction sheet   _X___ Use inhalers on the day of surgery and bring to hospital day of surgery-USE YOUR ALBUTEROL INHALER DAY OF SURGERY AND [place]  ____ Stop  Metformin and Janumet 2 days prior to surgery.    ____ Take 1/2 of usual insulin dose the night before surgery and none on the morning surgery.   ____ Follow recommendations from Cardiologist, Pulmonologist or PCP regarding stopping Aspirin, Coumadin, Plavix ,Eliquis, Effient, or Pradaxa, and Pletal.  X____Stop Anti-inflammatories such as Advil, Aleve, Ibuprofen, Motrin, Naproxen, Naprosyn, Goodies powders or aspirin products NOW-OK to take Tylenol OR PERCOCET IF NEEDED   _x___ Stop supplements until after surgery-STOP FISH OIL NOW-MAY RESUME AFTER SURGERY   ____ Bring C-Pap to the hospital.

## 2019-03-27 ENCOUNTER — Other Ambulatory Visit: Payer: Self-pay

## 2019-03-27 ENCOUNTER — Encounter
Admission: RE | Admit: 2019-03-27 | Discharge: 2019-03-27 | Disposition: A | Discharge status: Home / Self Care | Payer: No Typology Code available for payment source | Source: Ambulatory Visit | Attending: Urology | Admitting: Urology

## 2019-03-27 DIAGNOSIS — I10 Essential (primary) hypertension: Secondary | ICD-10-CM | POA: Insufficient documentation

## 2019-03-27 DIAGNOSIS — Z1159 Encounter for screening for other viral diseases: Secondary | ICD-10-CM | POA: Insufficient documentation

## 2019-03-27 DIAGNOSIS — J449 Chronic obstructive pulmonary disease, unspecified: Secondary | ICD-10-CM | POA: Insufficient documentation

## 2019-03-27 DIAGNOSIS — I4510 Unspecified right bundle-branch block: Secondary | ICD-10-CM | POA: Insufficient documentation

## 2019-03-27 LAB — NOVEL CORONAVIRUS, NAA (HOSP ORDER, SEND-OUT TO REF LAB; TAT 18-24 HRS): SARS-CoV-2, NAA: NOT DETECTED

## 2019-03-28 MED ORDER — CEFAZOLIN SODIUM-DEXTROSE 2-4 GM/100ML-% IV SOLN
2.0000 g | INTRAVENOUS | Status: AC
  Administered 2019-03-29: 2 g via INTRAVENOUS

## 2019-03-29 ENCOUNTER — Encounter
Admission: RE | Disposition: A | Discharge status: Home / Self Care | Payer: Self-pay | Source: Home / Self Care | Attending: Urology

## 2019-03-29 ENCOUNTER — Encounter: Payer: Self-pay | Admitting: Anesthesiology

## 2019-03-29 ENCOUNTER — Ambulatory Visit: Payer: Self-pay

## 2019-03-29 ENCOUNTER — Ambulatory Visit
Admission: RE | Admit: 2019-03-29 | Discharge: 2019-03-29 | Disposition: A | Discharge status: Home / Self Care | Payer: Self-pay | Attending: Urology | Admitting: Urology

## 2019-03-29 ENCOUNTER — Ambulatory Visit: Payer: Self-pay | Admitting: Anesthesiology

## 2019-03-29 ENCOUNTER — Other Ambulatory Visit: Payer: Self-pay

## 2019-03-29 DIAGNOSIS — K219 Gastro-esophageal reflux disease without esophagitis: Secondary | ICD-10-CM | POA: Insufficient documentation

## 2019-03-29 DIAGNOSIS — N201 Calculus of ureter: Secondary | ICD-10-CM

## 2019-03-29 DIAGNOSIS — I10 Essential (primary) hypertension: Secondary | ICD-10-CM | POA: Insufficient documentation

## 2019-03-29 DIAGNOSIS — N200 Calculus of kidney: Secondary | ICD-10-CM

## 2019-03-29 DIAGNOSIS — J449 Chronic obstructive pulmonary disease, unspecified: Secondary | ICD-10-CM | POA: Insufficient documentation

## 2019-03-29 DIAGNOSIS — F17200 Nicotine dependence, unspecified, uncomplicated: Secondary | ICD-10-CM | POA: Insufficient documentation

## 2019-03-29 DIAGNOSIS — F419 Anxiety disorder, unspecified: Secondary | ICD-10-CM | POA: Insufficient documentation

## 2019-03-29 DIAGNOSIS — Z1159 Encounter for screening for other viral diseases: Secondary | ICD-10-CM | POA: Insufficient documentation

## 2019-03-29 DIAGNOSIS — E039 Hypothyroidism, unspecified: Secondary | ICD-10-CM | POA: Insufficient documentation

## 2019-03-29 HISTORY — PX: CYSTOSCOPY/URETEROSCOPY/HOLMIUM LASER/STENT PLACEMENT: SHX6546

## 2019-03-29 LAB — POCT PREGNANCY, URINE: Preg Test, Ur: NEGATIVE

## 2019-03-29 SURGERY — CYSTOSCOPY/URETEROSCOPY/HOLMIUM LASER/STENT PLACEMENT
Anesthesia: General | Site: Ureter | Laterality: Right

## 2019-03-29 MED ORDER — PROPOFOL 10 MG/ML IV BOLUS
INTRAVENOUS | Status: AC
  Filled 2019-03-29: qty 40

## 2019-03-29 MED ORDER — OXYCODONE-ACETAMINOPHEN 5-325 MG PO TABS: 1.0000 | ORAL_TABLET | ORAL | 0 refills | Status: AC | PRN

## 2019-03-29 MED ORDER — LIDOCAINE HCL (CARDIAC) PF 100 MG/5ML IV SOSY
PREFILLED_SYRINGE | INTRAVENOUS | Status: DC | PRN
  Administered 2019-03-29: 60 mg via INTRAVENOUS

## 2019-03-29 MED ORDER — ONDANSETRON HCL 4 MG/2ML IJ SOLN
INTRAMUSCULAR | Status: DC | PRN
  Administered 2019-03-29: 4 mg via INTRAVENOUS

## 2019-03-29 MED ORDER — PROPOFOL 10 MG/ML IV BOLUS
INTRAVENOUS | Status: DC | PRN
  Administered 2019-03-29: 140 mg via INTRAVENOUS

## 2019-03-29 MED ORDER — EPHEDRINE SULFATE 50 MG/ML IJ SOLN
INTRAMUSCULAR | Status: DC | PRN
  Administered 2019-03-29: 5 mg via INTRAVENOUS

## 2019-03-29 MED ORDER — FENTANYL CITRATE (PF) 100 MCG/2ML IJ SOLN
INTRAMUSCULAR | Status: AC
  Filled 2019-03-29: qty 2

## 2019-03-29 MED ORDER — CEFAZOLIN SODIUM-DEXTROSE 2-4 GM/100ML-% IV SOLN
INTRAVENOUS | Status: AC
  Filled 2019-03-29: qty 100

## 2019-03-29 MED ORDER — ROCURONIUM BROMIDE 100 MG/10ML IV SOLN
INTRAVENOUS | Status: DC | PRN
  Administered 2019-03-29: 20 mg via INTRAVENOUS

## 2019-03-29 MED ORDER — DEXAMETHASONE SODIUM PHOSPHATE 10 MG/ML IJ SOLN
INTRAMUSCULAR | Status: DC | PRN
  Administered 2019-03-29: 5 mg via INTRAVENOUS

## 2019-03-29 MED ORDER — FENTANYL CITRATE (PF) 100 MCG/2ML IJ SOLN: 25.0000 ug | INTRAMUSCULAR | Status: DC | PRN

## 2019-03-29 MED ORDER — FENTANYL CITRATE (PF) 100 MCG/2ML IJ SOLN
INTRAMUSCULAR | Status: DC | PRN
  Administered 2019-03-29: 100 ug via INTRAVENOUS

## 2019-03-29 MED ORDER — SUGAMMADEX SODIUM 200 MG/2ML IV SOLN
INTRAVENOUS | Status: DC | PRN
  Administered 2019-03-29: 125 mg via INTRAVENOUS

## 2019-03-29 MED ORDER — LACTATED RINGERS IV SOLN
INTRAVENOUS | Status: DC
  Administered 2019-03-29: 17:00:00 via INTRAVENOUS

## 2019-03-29 MED ORDER — SUCCINYLCHOLINE CHLORIDE 20 MG/ML IJ SOLN
INTRAMUSCULAR | Status: DC | PRN
  Administered 2019-03-29: 60 mg via INTRAVENOUS

## 2019-03-29 MED ORDER — PROMETHAZINE HCL 25 MG/ML IJ SOLN: 6.2500 mg | INTRAMUSCULAR | Status: DC | PRN

## 2019-03-29 SURGICAL SUPPLY — 31 items
BAG DRAIN CYSTO-URO LG1000N (MISCELLANEOUS) ×3 IMPLANT
BRUSH SCRUB EZ 1% IODOPHOR (MISCELLANEOUS) ×3 IMPLANT
BULB IRRIG PATHFIND (MISCELLANEOUS) IMPLANT
CATH URETL 5X70 OPEN END (CATHETERS) IMPLANT
CNTNR SPEC 2.5X3XGRAD LEK (MISCELLANEOUS)
CONT SPEC 4OZ STER OR WHT (MISCELLANEOUS)
CONTAINER SPEC 2.5X3XGRAD LEK (MISCELLANEOUS) IMPLANT
DRAPE UTILITY 15X26 TOWEL STRL (DRAPES) ×3 IMPLANT
FIBER LASER LITHO 273 (Laser) ×3 IMPLANT
GLOVE BIOGEL PI IND STRL 7.5 (GLOVE) ×1 IMPLANT
GLOVE BIOGEL PI INDICATOR 7.5 (GLOVE) ×2
GOWN STRL REUS W/ TWL LRG LVL3 (GOWN DISPOSABLE) ×1 IMPLANT
GOWN STRL REUS W/ TWL XL LVL3 (GOWN DISPOSABLE) ×1 IMPLANT
GOWN STRL REUS W/TWL LRG LVL3 (GOWN DISPOSABLE) ×2
GOWN STRL REUS W/TWL XL LVL3 (GOWN DISPOSABLE) ×2
GUIDEWIRE STR DUAL SENSOR (WIRE) ×3 IMPLANT
INFUSOR MANOMETER BAG 3000ML (MISCELLANEOUS) ×3 IMPLANT
INTRODUCER DILATOR DOUBLE (INTRODUCER) IMPLANT
KIT TURNOVER CYSTO (KITS) ×3 IMPLANT
PACK CYSTO AR (MISCELLANEOUS) ×3 IMPLANT
SET CYSTO W/LG BORE CLAMP LF (SET/KITS/TRAYS/PACK) ×3 IMPLANT
SHEATH URETERAL 12FRX35CM (MISCELLANEOUS) IMPLANT
SOL .9 NS 3000ML IRR  AL (IV SOLUTION) ×2
SOL .9 NS 3000ML IRR UROMATIC (IV SOLUTION) ×1 IMPLANT
STENT URET 6FRX24 CONTOUR (STENTS) ×3 IMPLANT
STENT URET 6FRX26 CONTOUR (STENTS) IMPLANT
SURGILUBE 2OZ TUBE FLIPTOP (MISCELLANEOUS) ×3 IMPLANT
SYR 10ML LL (SYRINGE) ×3 IMPLANT
TUBING ART PRESS 48 MALE/FEM (TUBING) IMPLANT
VALVE UROSEAL ADJ ENDO (VALVE) ×3 IMPLANT
WATER STERILE IRR 1000ML POUR (IV SOLUTION) ×3 IMPLANT

## 2019-03-29 NOTE — Anesthesia Postprocedure Evaluation (Signed)
Anesthesia Post Note  Patient: Dawn Kane  Procedure(s) Performed: CYSTOSCOPY/URETEROSCOPY/HOLMIUM LASER/STENT exchange (Right Ureter)  Patient location during evaluation: PACU Anesthesia Type: General Level of consciousness: awake and alert Pain management: pain level controlled Vital Signs Assessment: post-procedure vital signs reviewed and stable Respiratory status: spontaneous breathing, nonlabored ventilation, respiratory function stable and patient connected to nasal cannula oxygen Cardiovascular status: blood pressure returned to baseline and stable Postop Assessment: no apparent nausea or vomiting Anesthetic complications: no     Last Vitals:  Vitals:   03/29/19 1832 03/29/19 1839  BP:  130/76  Pulse: 84 79  Resp: 15 13  Temp: 36.8 C 36.6 C  SpO2: 98% 99%    Last Pain:  Vitals:   03/29/19 1839  TempSrc: Temporal  PainSc: 0-No pain                 Nathin Saran S

## 2019-03-29 NOTE — Discharge Instructions (Signed)

## 2019-03-29 NOTE — Op Note (Signed)
Date of procedure: 03/29/19  Preoperative diagnosis:  1. Right 8 mm proximal ureteral stone, pre-stented for urinary tract infection  Postoperative diagnosis:  1. Same  Procedure: 1. Cystoscopy, right ureteroscopy, laser lithotripsy, right retrograde pyelogram with intraoperative interpretation, right ureteral stent placement   Surgeon: Nickolas Madrid, MD  Anesthesia: General  Complications: None  Intraoperative findings:  1.  Normal cystoscopy 2.  Uncomplicated dusting of right 38mm renal stone 3.  Uncomplicated right ureteral stent placement on Dangler  EBL: None  Specimens: None  Drains: Right 6 French by 24 cm ureteral stent on Dangler  Indication: Dawn Kane is a 48 y.o. patient that previously presented in mid May with a 8 mm right proximal ureteral stone with upstream E. coli UTI and underwent urgent ureteral stent placement with Dr. Junious Silk at that time.  She presents today for definitive management with ureteroscopy.  After reviewing the management options for treatment, they elected to proceed with the above surgical procedure(s). We have discussed the potential benefits and risks of the procedure, side effects of the proposed treatment, the likelihood of the patient achieving the goals of the procedure, and any potential problems that might occur during the procedure or recuperation. Informed consent has been obtained.  Description of procedure:  The patient was taken to the operating room and general anesthesia was induced. SCDs were placed for DVT prophylaxis.The patient was placed in the dorsal lithotomy position, prepped and draped in the usual sterile fashion, and preoperative antibiotics(cefazolin) were administered. A preoperative time-out was performed.   A 21 French rigid cystoscope was used to intubate the urethra.  Cystoscopy showed no abnormal lesions.  A sensor wire was advanced under fluoroscopic vision alongside the right ureteral stent up to the  kidney.  The stone was clearly seen on x-ray.  The stent was then grasped and removed to the meatus, and a second safety wire was used to cannulate the stent and advanced up into the kidney and the old stent removed.  The flexible single-channel ureteroscope advanced easily over the wire into the kidney.  Thorough pyeloscopy revealed a small 3 mm lower pole stone, and a 8 mm renal stone.  These were both fragmented to dust using a 273 m laser fiber on settings of 0.2 J and 40 Hz.  Thorough pyeloscopy revealed no significant residual fragments.  Contrast was injected through the scope and opacified the collecting system.  There was no extravasation or filling defects.  Careful pullback ureteroscopy revealed a widely patent ureter with no ureteral injury or residual fragments.  A 6 French by 24 cm ureteral stent was advanced under fluoroscopic vision over the wire with an excellent curl in the upper pole.  Cystoscopy confirmed an excellent curl in the bladder.  Dangler was left attached and taped to the right inner thigh with Tegaderm.  Disposition: Stable to PACU  Plan: Continue prophylactic Keflex while stent in place Patient to self remove stent at home in 4 days  Nickolas Madrid, MD
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
## 2019-03-29 NOTE — Anesthesia Post-op Follow-up Note (Signed)
Anesthesia QCDR form completed.        

## 2019-03-29 NOTE — H&P (Signed)
UROLOGY H&P UPDATE  Agree with prior H&P dated 6/5. 48 yo F with RIGHT 31mm proximal ureteral stone and E Coli UTI, urgently stented on 03/03/2019. Here today for definitive management with URS/LL/stent.  Cardiac: RRR Lungs: CTA bilaterally  Laterality: RIGHT Procedure: RIGHT ureteroscopy, laser lithotripsy, stent placement  Urine:  6/5 urinalysis with no bacteria, nitrite negative, 0-5 WBCs. Has been on ppx keflex. E Coli 5/17 pan sensitive  Informed consent obtained, we specifically discussed the risks of bleeding, infection, post-operative pain, need for additional procedures, stent related symptoms, need for stent removal in about one week, and ureteral injury.  Billey Co, MD 03/29/2019

## 2019-03-29 NOTE — Anesthesia Procedure Notes (Signed)
Procedure Name: Intubation Date/Time: 03/29/2019 5:34 PM Performed by: Chanetta Marshall, CRNA Pre-anesthesia Checklist: Patient identified, Emergency Drugs available, Suction available and Patient being monitored Patient Re-evaluated:Patient Re-evaluated prior to induction Oxygen Delivery Method: Circle system utilized Preoxygenation: Pre-oxygenation with 100% oxygen Induction Type: IV induction Ventilation: Mask ventilation without difficulty Laryngoscope Size: Mac and 3 Grade View: Grade I Tube type: Oral Tube size: 7.0 mm Number of attempts: 1 Airway Equipment and Method: Stylet and Oral airway Placement Confirmation: ETT inserted through vocal cords under direct vision,  positive ETCO2,  breath sounds checked- equal and bilateral and CO2 detector Secured at: 20 cm Tube secured with: Tape Dental Injury: Teeth and Oropharynx as per pre-operative assessment

## 2019-03-29 NOTE — Transfer of Care (Signed)
Immediate Anesthesia Transfer of Care Note  Patient: Dawn Kane  Procedure(s) Performed: CYSTOSCOPY/URETEROSCOPY/HOLMIUM LASER/STENT exchange (Right Ureter)  Patient Location: PACU  Anesthesia Type:General  Level of Consciousness: awake, alert  and oriented  Airway & Oxygen Therapy: Patient Spontanous Breathing  Post-op Assessment: Report given to RN and Post -op Vital signs reviewed and stable  Post vital signs: Reviewed and stable  Last Vitals:  Vitals Value Taken Time  BP 132/80 03/29/19 1810  Temp 37.1 C 03/29/19 1810  Pulse 88 03/29/19 1815  Resp 19 03/29/19 1815  SpO2 98 % 03/29/19 1815  Vitals shown include unvalidated device data.  Last Pain:  Vitals:   03/29/19 1810  TempSrc:   PainSc: 0-No pain         Complications: No apparent anesthesia complications

## 2019-03-30 ENCOUNTER — Encounter: Payer: Self-pay | Admitting: Urology

## 2019-04-01 ENCOUNTER — Telehealth: Payer: Self-pay | Admitting: Urology

## 2019-04-01 NOTE — Telephone Encounter (Signed)
Called pt advised her to continue taking Flomax until she is seen for her post op visit. Pt gave verbal understanding

## 2019-04-01 NOTE — Telephone Encounter (Signed)
Patient called the office today and asked if she needs to continue to take tamsulosin.  Her stent fell out over the weekend.  Please advise.

## 2019-04-16 ENCOUNTER — Other Ambulatory Visit: Payer: Self-pay

## 2019-04-16 DIAGNOSIS — N200 Calculus of kidney: Secondary | ICD-10-CM

## 2019-04-16 MED ORDER — TAMSULOSIN HCL 0.4 MG PO CAPS: 0.4000 mg | ORAL_CAPSULE | Freq: Every day | ORAL | 0 refills | Status: DC

## 2019-04-23 ENCOUNTER — Ambulatory Visit
Admission: RE | Admit: 2019-04-23 | Discharge: 2019-04-23 | Disposition: A | Discharge status: Home / Self Care | Payer: Self-pay | Source: Ambulatory Visit | Attending: Urology | Admitting: Urology

## 2019-04-23 ENCOUNTER — Ambulatory Visit: Payer: No Typology Code available for payment source

## 2019-04-23 ENCOUNTER — Other Ambulatory Visit: Payer: Self-pay

## 2019-04-23 DIAGNOSIS — N200 Calculus of kidney: Secondary | ICD-10-CM | POA: Insufficient documentation

## 2019-04-29 ENCOUNTER — Ambulatory Visit: Payer: No Typology Code available for payment source | Admitting: Urology

## 2019-05-01 ENCOUNTER — Encounter: Payer: Self-pay | Admitting: Urology

## 2019-05-01 ENCOUNTER — Other Ambulatory Visit: Payer: Self-pay

## 2019-05-01 ENCOUNTER — Ambulatory Visit (INDEPENDENT_AMBULATORY_CARE_PROVIDER_SITE_OTHER): Payer: Self-pay | Admitting: Urology

## 2019-05-01 VITALS — BP 109/74 | HR 96 | Ht 62.0 in | Wt 145.0 lb

## 2019-05-01 DIAGNOSIS — N949 Unspecified condition associated with female genital organs and menstrual cycle: Secondary | ICD-10-CM

## 2019-05-01 NOTE — Progress Notes (Signed)
   05/01/2019 4:06 PM   Dawn Kane 04/21/1971 [id number]  Reason for visit: Follow up nephrolithiasis  HPI: I saw Dawn Kane back in urology clinic for follow-up of nephrolithiasis.  Dawn Kane is a 48 year old female that originally underwent ureteral stent placement for an infected right 8 mm proximal ureteral stone with Dr. Junious Silk on 03/03/2019.  Dawn Kane underwent definitive management with ureteroscopy and laser lithotripsy with me on 03/29/2019, ureteral stent was left on the [place] and Dawn Kane remove this at home 5 days later.  Since surgery Dawn Kane is doing well denies any flank pain, gross hematuria, dysuria, or other urinary symptoms at this time.  Renal ultrasound on 04/23/2019 showed no hydronephrosis or residual stones.  We discussed general stone prevention strategies including adequate hydration with goal of producing 2.5 L of urine daily, increasing citric acid intake, increasing calcium intake during high oxalate meals, minimizing animal protein, and decreasing salt intake. Information about dietary recommendations given today.   Dawn Kane also had an incidentally noted 5 cm left adnexal cyst noted on CT from May 2020, and GYN referral was recommended.  RTC 1 year with KUB Referral placed to GYN for 5cm left adnexal cyst  A total of 10 minutes were spent face-to-face with the patient, greater than 50% was spent in patient education, counseling, and coordination of care regarding nephrolithiasis.   Billey Co, [facility] [address] [place], [place] [address] [phone]

## 2019-05-01 NOTE — Patient Instructions (Signed)
Dietary Guidelines to Help Prevent Kidney Stones Kidney stones are deposits of minerals and salts that form inside your kidneys. Your risk of developing kidney stones may be greater depending on your diet, your lifestyle, the medicines you take, and whether you have certain medical conditions. Most people can reduce their chances of developing kidney stones by following the instructions below. Depending on your overall health and the type of kidney stones you tend to develop, your dietitian may give you more specific instructions. What are tips for following this plan? Reading food labels  Choose foods with "no salt added" or "low-salt" labels. Limit your sodium intake to less than 1500 mg per day.  Choose foods with calcium for each meal and snack. Try to eat about 300 mg of calcium at each meal. Foods that contain 200-500 mg of calcium per serving include: ? 8 oz (237 ml) of milk, fortified nondairy milk, and fortified fruit juice. ? 8 oz (237 ml) of kefir, yogurt, and soy yogurt. ? 4 oz (118 ml) of tofu. ? 1 oz of cheese. ? 1 cup (300 g) of dried figs. ? 1 cup (91 g) of cooked broccoli. ? 1-3 oz can of sardines or mackerel.  Most people need 1000 to 1500 mg of calcium each day. Talk to your dietitian about how much calcium is recommended for you. Shopping  Buy plenty of fresh fruits and vegetables. Most people do not need to avoid fruits and vegetables, even if they contain nutrients that may contribute to kidney stones.  When shopping for convenience foods, choose: ? Whole pieces of fruit. ? Premade salads with dressing on the side. ? Low-fat fruit and yogurt smoothies.  Avoid buying frozen meals or prepared deli foods.  Look for foods with live cultures, such as yogurt and kefir. Cooking  Do not add salt to food when cooking. Place a salt shaker on the table and allow each person to add his or her own salt to taste.  Use vegetable protein, such as beans, textured vegetable  protein (TVP), or tofu instead of meat in pasta, casseroles, and soups. Meal planning   Eat less salt, if told by your dietitian. To do this: ? Avoid eating processed or premade food. ? Avoid eating fast food.  Eat less animal protein, including cheese, meat, poultry, or fish, if told by your dietitian. To do this: ? Limit the number of times you have meat, poultry, fish, or cheese each week. Eat a diet free of meat at least 2 days a week. ? Eat only one serving each day of meat, poultry, fish, or seafood. ? When you prepare animal protein, cut pieces into small portion sizes. For most meat and fish, one serving is about the size of one deck of cards.  Eat at least 5 servings of fresh fruits and vegetables each day. To do this: ? Keep fruits and vegetables on hand for snacks. ? Eat 1 piece of fruit or a handful of berries with breakfast. ? Have a salad and fruit at lunch. ? Have two kinds of vegetables at dinner.  Limit foods that are high in a substance called oxalate. These include: ? Spinach. ? Rhubarb. ? Beets. ? Potato chips and french fries. ? Nuts.  If you regularly take a diuretic medicine, make sure to eat at least 1-2 fruits or vegetables high in potassium each day. These include: ? Avocado. ? Banana. ? Orange, prune, carrot, or tomato juice. ? Baked potato. ? Cabbage. ? Beans and split   peas. General instructions   Drink enough fluid to keep your urine clear or pale yellow. This is the most important thing you can do.  Talk to your health care provider and dietitian about taking daily supplements. Depending on your health and the cause of your kidney stones, you may be advised: ? Not to take supplements with vitamin C. ? To take a calcium supplement. ? To take a daily probiotic supplement. ? To take other supplements such as magnesium, fish oil, or vitamin B6.  Take all medicines and supplements as told by your health care provider.  Limit alcohol intake to no  more than 1 drink a day for nonpregnant women and 2 drinks a day for men. One drink equals 12 oz of beer, 5 oz of wine, or 1 oz of hard liquor.  Lose weight if told by your health care provider. Work with your dietitian to find strategies and an eating plan that works best for you. What foods are not recommended? Limit your intake of the following foods, or as told by your dietitian. Talk to your dietitian about specific foods you should avoid based on the type of kidney stones and your overall health. Grains Breads. Bagels. Rolls. Baked goods. Salted crackers. Cereal. Pasta. Vegetables Spinach. Rhubarb. Beets. Canned vegetables. Pickles. Olives. Meats and other protein foods Nuts. Nut butters. Large portions of meat, poultry, or fish. Salted or cured meats. Deli meats. Hot dogs. Sausages. Dairy Cheese. Beverages Regular soft drinks. Regular vegetable juice. Seasonings and other foods Seasoning blends with salt. Salad dressings. Canned soups. Soy sauce. Ketchup. Barbecue sauce. Canned pasta sauce. Casseroles. Pizza. Lasagna. Frozen meals. Potato chips. French fries. Summary  You can reduce your risk of kidney stones by making changes to your diet.  The most important thing you can do is drink enough fluid. You should drink enough fluid to keep your urine clear or pale yellow.  Ask your health care provider or dietitian how much protein from animal sources you should eat each day, and also how much salt and calcium you should have each day. This information is not intended to replace advice given to you by your health care provider. Make sure you discuss any questions you have with your health care provider. Document Released: 01/28/2011 Document Revised: 01/23/2019 Document Reviewed: 09/13/2016 Elsevier Patient Education  2020 Elsevier Inc.  

## 2019-06-18 ENCOUNTER — Encounter: Payer: Self-pay | Admitting: Obstetrics & Gynecology

## 2019-06-18 ENCOUNTER — Other Ambulatory Visit: Payer: Self-pay

## 2019-06-18 ENCOUNTER — Ambulatory Visit (INDEPENDENT_AMBULATORY_CARE_PROVIDER_SITE_OTHER): Payer: Self-pay | Admitting: Obstetrics & Gynecology

## 2019-06-18 VITALS — BP 120/80 | Ht 62.0 in | Wt 147.0 lb

## 2019-06-18 DIAGNOSIS — Z124 Encounter for screening for malignant neoplasm of cervix: Secondary | ICD-10-CM

## 2019-06-18 DIAGNOSIS — N9489 Other specified conditions associated with female genital organs and menstrual cycle: Secondary | ICD-10-CM

## 2019-06-18 NOTE — Patient Instructions (Signed)
Ovarian Cyst     An ovarian cyst is a fluid-filled sac that forms on an ovary. The ovaries are small organs that produce eggs in women. Various types of cysts can form on the ovaries. Some may cause symptoms and require treatment. Most ovarian cysts go away on their own, are not cancerous (are benign), and do not cause problems. Common types of ovarian cysts include:  Functional (follicle) cysts. ? Occur during the menstrual cycle, and usually go away with the next menstrual cycle if you do not get pregnant. ? Usually cause no symptoms.  Endometriomas. ? Are cysts that form from the tissue that lines the uterus (endometrium). ? Are sometimes called "chocolate cysts" because they become filled with blood that turns brown. ? Can cause pain in the lower abdomen during intercourse and during your period.  Cystadenoma cysts. ? Develop from cells on the outside surface of the ovary. ? Can get very large and cause lower abdomen pain and pain with intercourse. ? Can cause severe pain if they twist or break open (rupture).  Dermoid cysts. ? Are sometimes found in both ovaries. ? May contain different kinds of body tissue, such as skin, teeth, hair, or cartilage. ? Usually do not cause symptoms unless they get very big.  Theca lutein cysts. ? Occur when too much of a certain hormone (human chorionic gonadotropin) is produced and overstimulates the ovaries to produce an egg. ? Are most common after having procedures used to assist with the conception of a baby (in vitro fertilization). What are the causes? Ovarian cysts may be caused by:  Ovarian hyperstimulation syndrome. This is a condition that can develop from taking fertility medicines. It causes multiple large ovarian cysts to form.  Polycystic ovarian syndrome (PCOS). This is a common hormonal disorder that can cause ovarian cysts, as well as problems with your period or fertility. What increases the risk? The following factors may  make you more likely to develop ovarian cysts:  Being overweight or obese.  Taking fertility medicines.  Taking certain forms of hormonal birth control.  Smoking. What are the signs or symptoms? Many ovarian cysts do not cause symptoms. If symptoms are present, they may include:  Pelvic pain or pressure.  Pain in the lower abdomen.  Pain during sex.  Abdominal swelling.  Abnormal menstrual periods.  Increasing pain with menstrual periods. How is this diagnosed? These cysts are commonly found during a routine pelvic exam. You may have tests to find out more about the cyst, such as:  Ultrasound.  X-ray of the pelvis.  CT scan.  MRI.  Blood tests. How is this treated? Many ovarian cysts go away on their own without treatment. Your health care provider may want to check your cyst regularly for 2-3 months to see if it changes. If you are in menopause, it is especially important to have your cyst monitored closely because menopausal women have a higher rate of ovarian cancer. When treatment is needed, it may include:  Medicines to help relieve pain.  A procedure to drain the cyst (aspiration).  Surgery to remove the whole cyst.  Hormone treatment or birth control pills. These methods are sometimes used to help dissolve a cyst. Follow these instructions at home:  Take over-the-counter and prescription medicines only as told by your health care provider.  Do not drive or use heavy machinery while taking prescription pain medicine.  Get regular pelvic exams and Pap tests as often as told by your health care provider.    Return to your normal activities as told by your health care provider. Ask your health care provider what activities are safe for you.  Do not use any products that contain nicotine or tobacco, such as cigarettes and e-cigarettes. If you need help quitting, ask your health care provider.  Keep all follow-up visits as told by your health care provider.  This is important. Contact a health care provider if:  Your periods are late, irregular, or painful, or they stop.  You have pelvic pain that does not go away.  You have pressure on your bladder or trouble emptying your bladder completely.  You have pain during sex.  You have any of the following in your abdomen: ? A feeling of fullness. ? Pressure. ? Discomfort. ? Pain that does not go away. ? Swelling.  You feel generally ill.  You become constipated.  You lose your appetite.  You develop severe acne.  You start to have more body hair and facial hair.  You are gaining weight or losing weight without changing your exercise and eating habits.  You think you may be pregnant. Get help right away if:  You have abdominal pain that is severe or gets worse.  You cannot eat or drink without vomiting.  You suddenly develop a fever.  Your menstrual period is much heavier than usual. This information is not intended to replace advice given to you by your health care provider. Make sure you discuss any questions you have with your health care provider. Document Released: 10/03/2005 Document Revised: 01/01/2018 Document Reviewed: 03/06/2016 Elsevier Patient Education  2020 Elsevier Inc.  

## 2019-06-18 NOTE — Progress Notes (Signed)
Consultant: Dr Diamantina Providence Reason: Adnexal Mass  HPI: Patient is a 48 y.o. [id number] who LMP was Patient's last menstrual period was 06/17/2016., presents today for a problem visit.  She complains of recent findings of Left adnexal mass by CT - Pelvis.  Pt has had symptoms of rare pain and bloating, also has GERD and GI issues which she related to these sx's.  No change in weight.  No periods for 2 years and has only occas hot flash sx's currently..  Previous evaluation: CT by urology due to RLQ pain and nausea in May 2020, with additional findings of: A low-density lesion in the left adnexa measures 5.2 x 3.8 x 3.9 cm. There is a focal calcification within the medial aspect of the lesion. Right adnexa is within normal limits. Uterus is unremarkable. Prior Diagnosis: none. FH ovarian cysts, but not personal hx. Previous Treatment: none.  PMHx: She  has a past medical history of Anxiety, COPD (chronic obstructive pulmonary disease) ([place]), GERD (gastroesophageal reflux disease), Hyperlipidemia, Hypertension, Hypothyroidism, and Obesity. Also,  has a past surgical history that includes Mandible surgery; Clavicle surgery; Lung surgery; Colonoscopy with propofol (N/A, 07/04/2018); polypectomy (07/04/2018); Cystoscopy w/ ureteral stent placement (Right, 03/03/2019); and Cystoscopy/ureteroscopy/holmium laser/stent placement (Right, 03/29/2019)., family history includes Cancer in her paternal grandfather; Heart disease in her paternal grandmother.,  reports that she has been smoking cigarettes. She has been smoking about 1.50 packs per day. She has never used smokeless tobacco. She reports current drug use. Frequency: 3.00 times per week. Drug: Marijuana. She reports that she does not drink alcohol.  She has a current medication list which includes the following prescription(s): levothyroxine, albuterol, ibuprofen, multivitamin with minerals, fish oil, omeprazole, and tamsulosin. Also, is allergic to amoxicillin  and aspirin.  Review of Systems  Constitutional: Negative for chills, fever and malaise/fatigue.  HENT: Negative for congestion, sinus pain and sore throat.   Eyes: Negative for blurred vision and pain.  Respiratory: Negative for cough and wheezing.   Cardiovascular: Negative for chest pain and leg swelling.  Gastrointestinal: Negative for abdominal pain, constipation, diarrhea, heartburn, nausea and vomiting.  Genitourinary: Negative for dysuria, frequency, hematuria and urgency.  Musculoskeletal: Positive for joint pain. Negative for back pain, myalgias and neck pain.  Skin: Negative for itching and rash.  Neurological: Positive for dizziness. Negative for tremors and weakness.  Endo/Heme/Allergies: Does not bruise/bleed easily.  Psychiatric/Behavioral: Negative for depression. The patient is not nervous/anxious and does not have insomnia.     Objective: BP 120/80    Ht 5\' 2"  (1.575 m)    Wt 147 lb (66.7 kg)    LMP 06/17/2016 Comment: neg preg test   BMI 26.89 kg/m  Physical Exam Constitutional:      General: She is not in acute distress.    Appearance: She is well-developed.  Genitourinary:     Pelvic exam was performed with patient supine.     Vagina, cervix, uterus and rectum normal.     No lesions in the vagina.     No vaginal bleeding.     No cervical polyp.     Uterus is mobile.     Uterus is not enlarged.     No uterine mass detected.    Uterus is midaxial and regular.     Left adnexal mass present.     No right adnexal mass present.     Right adnexa mobile.     Right adnexa not tender.     Left adnexa tender and full.  Left adnexa not mobile.  HENT:     Head: Normocephalic and atraumatic. No laceration.     Right Ear: Hearing normal.     Left Ear: Hearing normal.     Mouth/Throat:     Pharynx: Uvula midline.  Eyes:     Pupils: Pupils are equal, round, and reactive to light.  Neck:     Musculoskeletal: Normal range of motion and neck supple.     Thyroid:  No thyromegaly.  Cardiovascular:     Rate and Rhythm: Normal rate and regular rhythm.     Heart sounds: No murmur. No friction rub. No gallop.   Pulmonary:     Effort: Pulmonary effort is normal. No respiratory distress.     Breath sounds: Normal breath sounds. No wheezing.  Chest:     Breasts:        Right: No mass, skin change or tenderness.        Left: No mass, skin change or tenderness.  Abdominal:     General: Bowel sounds are normal. There is no distension.     Palpations: Abdomen is soft.     Tenderness: There is no abdominal tenderness. There is no rebound.  Musculoskeletal: Normal range of motion.  Neurological:     Mental Status: She is alert and oriented to person, place, and time.     Cranial Nerves: No cranial nerve deficit.  Skin:    General: Skin is warm and dry.  Psychiatric:        Judgment: Judgment normal.  Vitals signs reviewed.     ASSESSMENT/PLAN:    Problem List Items Addressed This Visit      Other   Adnexal mass - Primary   Relevant Orders   US PELVIC COMPLETE WITH TRANSVAGINAL    Other Visit Diagnoses    Screening for cervical cancer        CA125 based on US results (Pt trying to keep costs down as is self pay) Concern for persistance of mass since CT in [id number] Options for US surveillance, but also surgery discussed.  The incidence and implication of adnexal masses and ovarian cysts were discussed with the patient in detail.  Prior imaging was reviewed at today's visit. The vast majority of these lesions will represent benign or physiologic processes and may well resolve on repeat imaging with expectant management.   In some cases these cysts can take on larger dimensions, hemorrhage, or undergo torsion making them symptomatic. Torsion is relatively unlikely for lesions under 5 cm.  Torsion precautions were given.   PAP done today thru [facility]  Annamarie MajorPaul Javeah Loeza, MD, Merlinda FrederickFACOG [facility], [facility] 06/18/2019  9:35 AM

## 2019-06-19 ENCOUNTER — Other Ambulatory Visit: Payer: Self-pay

## 2019-06-19 ENCOUNTER — Ambulatory Visit (INDEPENDENT_AMBULATORY_CARE_PROVIDER_SITE_OTHER): Payer: Self-pay

## 2019-06-19 ENCOUNTER — Encounter: Payer: Self-pay | Admitting: Obstetrics & Gynecology

## 2019-06-19 ENCOUNTER — Ambulatory Visit (INDEPENDENT_AMBULATORY_CARE_PROVIDER_SITE_OTHER): Payer: Self-pay | Admitting: Obstetrics & Gynecology

## 2019-06-19 VITALS — BP 140/90 | Ht 62.0 in | Wt 147.0 lb

## 2019-06-19 DIAGNOSIS — N9489 Other specified conditions associated with female genital organs and menstrual cycle: Secondary | ICD-10-CM

## 2019-06-19 DIAGNOSIS — N83202 Unspecified ovarian cyst, left side: Secondary | ICD-10-CM

## 2019-06-19 NOTE — Progress Notes (Signed)
HPI: Prior cyst identified by CT, has occas mild LLQ pain.  No bloating or weight changes.  FH for cysts, no prior herself.  Ultrasound demonstrates cyst seen LEFT, 5 cm, see below  PMHx: She  has a past medical history of Anxiety, COPD (chronic obstructive pulmonary disease) (HCC), GERD (gastroesophageal reflux disease), Hyperlipidemia, Hypertension, Hypothyroidism, and Obesity. Also,  has a past surgical history that includes Mandible surgery; Clavicle surgery; Lung surgery; Colonoscopy with propofol (N/A, 07/04/2018); polypectomy (07/04/2018); Cystoscopy w/ ureteral stent placement (Right, 03/03/2019); and Cystoscopy/ureteroscopy/holmium laser/stent placement (Right, 03/29/2019)., family history includes Cancer in her paternal grandfather; Heart disease in her paternal grandmother.,  reports that she has been smoking cigarettes. She has been smoking about 1.50 packs per day. She has never used smokeless tobacco. She reports current drug use. Frequency: 3.00 times per week. Drug: Marijuana. She reports that she does not drink alcohol.  She has a current medication list which includes the following prescription(s): albuterol, ibuprofen, levothyroxine, multivitamin with minerals, fish oil, omeprazole, and tamsulosin. Also, is allergic to amoxicillin and aspirin.  Review of Systems  All other systems reviewed and are negative.   Objective: BP 140/90   Ht 5\' 2"  (1.575 m)   Wt 147 lb (66.7 kg)   LMP 06/17/2016 Comment: neg preg test  BMI 26.89 kg/m   Physical examination Constitutional NAD, Conversant  Skin No rashes, lesions or ulceration.   Extremities: Moves all appropriately.  Normal ROM for age. No lymphadenopathy.  Neuro: Grossly intact  Psych: Oriented to PPT.  Normal mood. Normal affect.   [place] Pelvic Complete With Transvaginal  Result Date: 06/19/2019 Patient Name: Dawn Kane DOB: 05/07/1971 MRN: [id number] ULTRASOUND REPORT Location: [facility] Date of Service: 06/19/2019   Indications: left ovarian cyst Findings: The uterus is anteverted and measures 7.2 x 3.5 x 2.9 cm. Echo texture is homogenous without evidence of focal masses. The Endometrium measures 2.4 mm. Right Ovary measures 2.4 x 2.2 x 1.4 cm. It is normal in appearance. Left Ovary measures 5.0 x 3.9 x 3.4 cm. It is not normal in appearance. There is a left ovarian cyst with anechoic fluid and one septation. There is no blood flow seen within the cyst. The cyst measures 5.0 x 3.4 x 3.3 cm. Survey of the adnexa demonstrates no adnexal masses. There is no free fluid in the cul de sac. Impression: 1. Normal uterus and cervix. 2. Normal right ovary. 3. There is a 5.0 cm complex cyst in the left ovary. Recommendations: 1.Clinical correlation with the patient's History and Physical Exam. Deanna ArtisElyse S Fairbanks, RT Review of ULTRASOUND.    I have personally reviewed images and report of recent ultrasound [facility] at [facility].    Plan of management to be discussed with patient. Annamarie MajorPaul Colleene Swarthout, MD, FACOG [facility], [facility] 06/19/2019  11:16 AM   Assessment:  Adnexal mass - Plan: CA 125 Cyst of left ovary - Plan follow up US 2 mos Also consider option of cystectomy vs oophorectomy Monitor for pain, signs of torsion Pt prefers to avoid surgery at this time, and as sx's are mild this is reasonable  The incidence and implication of adnexal masses and ovarian cysts were discussed with the patient in detail.  Current and pior imaging was reviewed at today's visit. The vast majority of these lesions will represent benign or physiologic processes and may well resolve on repeat imaging with expectant management.  We discussed that in a premenopausal patient not on ovulation suppression with via a  systemic form of hormonal contraception the normal function of the ovary during follicular development is the formation of a dominant follicle or cyst(s) every month.  This is an essential part of normal reproductive physiology.   In some cases these cysts can take on larger dimensions, hemorrhage, or undergo torsion making them symptomatic. Torsion is relatively unlikely for lesions under 5 cm.  Based on initial imaging findings the overall concern for malignancy is deemed low.  We will obtain follow up imagining approximately 8 weeks from the date of the initial imaging study.  Torsion precautions were given.   A total of 15 minutes were spent face-to-face with the patient during this encounter and over half of that time dealt with counseling and coordination of care.  Barnett Applebaum, MD, [facility], [facility] 06/19/2019  11:27 AM

## 2019-06-20 LAB — CA 125: Cancer Antigen (CA) 125: 10.4 U/mL (ref 0.0–38.1)

## 2019-06-26 ENCOUNTER — Other Ambulatory Visit: Payer: Self-pay | Admitting: Obstetrics & Gynecology

## 2019-08-19 ENCOUNTER — Ambulatory Visit: Payer: Self-pay | Admitting: Obstetrics & Gynecology

## 2019-08-19 ENCOUNTER — Other Ambulatory Visit: Payer: Self-pay

## 2019-09-20 ENCOUNTER — Other Ambulatory Visit: Payer: Self-pay | Admitting: Family

## 2019-10-18 DIAGNOSIS — J189 Pneumonia, unspecified organism: Secondary | ICD-10-CM

## 2019-10-18 DIAGNOSIS — S065XAA Traumatic subdural hemorrhage with loss of consciousness status unknown, initial encounter: Secondary | ICD-10-CM

## 2019-10-18 HISTORY — DX: Pneumonia, unspecified organism: J18.9

## 2019-10-18 HISTORY — DX: Traumatic subdural hemorrhage with loss of consciousness status unknown, initial encounter: S06.5XAA

## 2019-11-06 NOTE — Telephone Encounter (Signed)
Erroneous encounter

## 2020-02-17 NOTE — Telephone Encounter (Signed)
Error

## 2020-04-30 ENCOUNTER — Ambulatory Visit: Payer: No Typology Code available for payment source | Admitting: Urology

## 2020-04-30 ENCOUNTER — Encounter: Payer: Self-pay | Admitting: Urology

## 2020-08-28 ENCOUNTER — Other Ambulatory Visit: Payer: Self-pay | Admitting: Family

## 2020-09-04 ENCOUNTER — Other Ambulatory Visit: Payer: Self-pay

## 2020-09-04 ENCOUNTER — Emergency Department
Admission: EM | Admit: 2020-09-04 | Discharge: 2020-09-04 | Disposition: A | Discharge status: Home / Self Care | Payer: Self-pay | Attending: Emergency Medicine | Admitting: Emergency Medicine

## 2020-09-04 ENCOUNTER — Encounter: Payer: Self-pay | Admitting: Emergency Medicine

## 2020-09-04 ENCOUNTER — Emergency Department: Payer: Self-pay

## 2020-09-04 DIAGNOSIS — J449 Chronic obstructive pulmonary disease, unspecified: Secondary | ICD-10-CM | POA: Insufficient documentation

## 2020-09-04 DIAGNOSIS — Z955 Presence of coronary angioplasty implant and graft: Secondary | ICD-10-CM | POA: Insufficient documentation

## 2020-09-04 DIAGNOSIS — S065XAA Traumatic subdural hemorrhage with loss of consciousness status unknown, initial encounter: Secondary | ICD-10-CM

## 2020-09-04 DIAGNOSIS — F17210 Nicotine dependence, cigarettes, uncomplicated: Secondary | ICD-10-CM | POA: Insufficient documentation

## 2020-09-04 DIAGNOSIS — W01198A Fall on same level from slipping, tripping and stumbling with subsequent striking against other object, initial encounter: Secondary | ICD-10-CM | POA: Insufficient documentation

## 2020-09-04 DIAGNOSIS — Z79899 Other long term (current) drug therapy: Secondary | ICD-10-CM | POA: Insufficient documentation

## 2020-09-04 DIAGNOSIS — E039 Hypothyroidism, unspecified: Secondary | ICD-10-CM | POA: Insufficient documentation

## 2020-09-04 DIAGNOSIS — S065X0A Traumatic subdural hemorrhage without loss of consciousness, initial encounter: Secondary | ICD-10-CM | POA: Insufficient documentation

## 2020-09-04 DIAGNOSIS — S065X9A Traumatic subdural hemorrhage with loss of consciousness of unspecified duration, initial encounter: Secondary | ICD-10-CM

## 2020-09-04 DIAGNOSIS — I10 Essential (primary) hypertension: Secondary | ICD-10-CM | POA: Insufficient documentation

## 2020-09-04 DIAGNOSIS — Y990 Civilian activity done for income or pay: Secondary | ICD-10-CM | POA: Insufficient documentation

## 2020-09-04 MED ORDER — OXYCODONE-ACETAMINOPHEN 5-325 MG PO TABS
2.0000 | ORAL_TABLET | Freq: Once | ORAL | Status: AC
  Administered 2020-09-04: 2 via ORAL
  Filled 2020-09-04: qty 2

## 2020-09-04 MED ORDER — OXYCODONE-ACETAMINOPHEN 5-325 MG PO TABS: 1.0000 | ORAL_TABLET | ORAL | 0 refills | Status: DC | PRN

## 2020-09-04 NOTE — ED Provider Notes (Signed)
[facility] Emergency Department Provider Note  Time seen: 1:33 PM  I have reviewed the triage vital signs and the nursing notes.   HISTORY  Chief Complaint Fall and Head Injury   HPI Dawn Kane is a 49 y.o. female with a past medical history of anxiety, COPD, gastric reflux, hypertension, hyperlipidemia, presents to the emergency department for headache and head injury.  According to the patient on Sunday night she was working on a stool when she slipped off hitting her head on a solid wood coffee table.  Patient denies LOC but states developed a headache and slept nearly the entire next day thinking she had a concussion.  Patient states since she is continued to have headaches unrelieved by Tylenol as well as intermittent dizziness so the patient presented to the emergency department for evaluation.  Patient denies any blood thinners or alcohol use.  Does state general fatigue otherwise negative review of systems.   Past Medical History:  Diagnosis Date  . Anxiety   . COPD (chronic obstructive pulmonary disease) (HCC)   . GERD (gastroesophageal reflux disease)   . Hyperlipidemia   . Hypertension   . Hypothyroidism   . Obesity     Patient Active Problem List   Diagnosis Date Noted  . Adnexal mass 06/18/2019  . Pyelonephritis of right kidney 03/03/2019  . Chronic hepatitis C without hepatic coma (HCC) 08/01/2017  . Sepsis (HCC) 07/09/2016  . CAP (community acquired pneumonia) 07/09/2016  . Anxiety 07/09/2016  . Hypothyroidism 07/09/2016    Past Surgical History:  Procedure Laterality Date  . CLAVICLE SURGERY    . COLONOSCOPY WITH PROPOFOL N/A 07/04/2018   Procedure: COLONOSCOPY WITH PROPOFOL;  Surgeon: Kathi Der, MD;  Location: [facility];  Service: Gastroenterology;  Laterality: N/A;  . CYSTOSCOPY W/ URETERAL STENT PLACEMENT Right 03/03/2019   Procedure: CYSTOSCOPY WITH RETROGRADE PYELOGRAM/URETERAL STENT PLACEMENT;  Surgeon: Jerilee Field, MD;  Location: [facility];  Service: Urology;  Laterality: Right;  . CYSTOSCOPY/URETEROSCOPY/HOLMIUM LASER/STENT PLACEMENT Right 03/29/2019   Procedure: CYSTOSCOPY/URETEROSCOPY/HOLMIUM LASER/STENT exchange;  Surgeon: Sondra Come, MD;  Location: [facility] ORS;  Service: Urology;  Laterality: Right;  . LUNG SURGERY    . MANDIBLE SURGERY    . POLYPECTOMY  07/04/2018   Procedure: POLYPECTOMY;  Surgeon: Kathi Der, MD;  Location: [facility];  Service: Gastroenterology;;    Prior to Admission medications   Medication Sig Start Date End Date Taking? Authorizing Provider  albuterol (PROVENTIL HFA;VENTOLIN HFA) 108 (90 Base) MCG/ACT inhaler Inhale 1-2 puffs into the lungs every 6 (six) hours as needed for wheezing or shortness of breath.    [provider]  ibuprofen (ADVIL,MOTRIN) 200 MG tablet Take 200-400 mg by mouth every 6 (six) hours as needed for headache or moderate pain.     [provider]  levothyroxine (SYNTHROID) 150 MCG tablet Take 150 mcg by mouth daily before breakfast.     [provider]  Multiple Vitamin (MULTIVITAMIN WITH MINERALS) TABS tablet Take 1 tablet by mouth daily.    [provider]  Omega-3 Fatty Acids (FISH OIL) 1200 MG CAPS Take 1,200 mg by mouth daily.    [provider]  omeprazole (PRILOSEC) 20 MG capsule Take 20 mg by mouth daily as needed (for acid reflux).     [provider]  tamsulosin (FLOMAX) 0.4 MG CAPS capsule Take 1 capsule (0.4 mg total) by mouth daily after supper. Patient not taking: Reported on 06/18/2019 04/16/19   Jerilee Field, MD  Allergies  Allergen Reactions  . Amoxicillin Nausea And Vomiting and Other (See Comments)    Has patient had a PCN reaction causing immediate rash, facial/tongue/throat swelling, SOB or lightheadedness with hypotension: No Has patient had a PCN reaction causing severe rash involving mucus membranes or skin necrosis: No Has patient had a PCN reaction  that required hospitalization No Has patient had a PCN reaction occurring within the last 10 years: No If all of the above answers are "NO", then may proceed with Cephalosporin use.   . Aspirin Nausea And Vomiting    Family History  Problem Relation Age of Onset  . Heart disease Paternal Grandmother   . Cancer Paternal Grandfather        pancreatic    Social History Social History   Tobacco Use  . Smoking status: Current Every Day Smoker    Packs/day: 1.50    Types: Cigarettes  . Smokeless tobacco: Never Used  Vaping Use  . Vaping Use: Never used  Substance Use Topics  . Alcohol use: No    Comment: quit a year ago  . Drug use: Yes    Frequency: 3.0 times per week    Types: Marijuana    Review of Systems Constitutional: Negative for fever.  Generalized weakness. Cardiovascular: Negative for chest pain. Respiratory: Negative for shortness of breath. Gastrointestinal: Negative for abdominal pain, vomiting Musculoskeletal: Negative for musculoskeletal complaints Neurological: Moderate headache.  Denies focal weakness or numbness. All other ROS negative  ____________________________________________  PHYSICAL EXAM:  VITAL SIGNS: ED Triage Vitals  Enc Vitals Group     BP 09/04/20 1156 (!) 150/97     Pulse Rate 09/04/20 1156 67     Resp 09/04/20 1156 18     Temp 09/04/20 1156 98 F (36.7 C)     Temp src --      SpO2 09/04/20 1156 100 %     Weight 09/04/20 1158 150 lb (68 kg)     Height 09/04/20 1158 5\' 2"  (1.575 m)     Head Circumference --      Peak Flow --      Pain Score 09/04/20 1158 9     Pain Loc --      Pain Edu? --      Excl. in GC? --    Constitutional: Alert and oriented. Well appearing and in no distress. Eyes: Normal exam ENT      Head: Normocephalic and atraumatic.      Mouth/Throat: Mucous membranes are moist. Cardiovascular: Normal rate, regular rhythm.  Respiratory: Normal respiratory effort without tachypnea nor retractions. Breath sounds  are clear  Gastrointestinal: Soft and nontender. No distention.  Musculoskeletal: Nontender with normal range of motion in all extremities. Neurologic:  Normal speech and language. No gross focal neurologic deficits.  Equal grip strength bilaterally.  No drift.  Cranial nerves intact. Skin:  Skin is warm, dry and intact.  Psychiatric: Mood and affect are normal.   ____________________________________________   RADIOLOGY  CT shows small subdural acute for subacute hematoma.  I personally reviewed the CT images and agree with radiology read.  ____________________________________________   INITIAL IMPRESSION / ASSESSMENT AND PLAN / ED COURSE  Pertinent labs & imaging results that were available during my care of the patient were reviewed by me and considered in my medical decision making (see chart for details).   Patient presents emergency department 4 to 5 days after a fall and head injury.  Patient states she was fatigued and slept for nearly  a whole day afterwards.  Patient CT scan shows a small acute for subacute subdural hematoma.  Given these findings I spoke to Dr. Marcell Barlow of neurosurgery.  He states as this occurred several days ago no further imaging needed.  He will follow up with the patient in the office.  We will place the patient on a short course of pain medication as needed for the headaches.  I discussed my typical concussion as well as neurologic return precautions.  Dawn Kane was evaluated in Emergency Department on 09/04/2020 for the symptoms described in the history of present illness. She was evaluated in the context of the global COVID-19 pandemic, which necessitated consideration that the patient might be at risk for infection with the SARS-CoV-2 virus that causes COVID-19. Institutional protocols and algorithms that pertain to the evaluation of patients at risk for COVID-19 are in a state of rapid change based on information released by regulatory bodies  including the CDC and federal and state organizations. These policies and algorithms were followed during the patient's care in the ED.  ____________________________________________   FINAL CLINICAL IMPRESSION(S) / ED DIAGNOSES  Subdural hematoma   Minna Antis, MD 09/04/20 1421

## 2020-09-04 NOTE — ED Triage Notes (Signed)
Pt to ER states she fell off a stool on Monday and hit her head on a solid coffee table.  Pt states continues to have headache, dizziness and sensitivity to light.  Pt denies LOC.

## 2020-09-04 NOTE — ED Notes (Signed)
ED Provider at bedside. 

## 2020-09-07 ENCOUNTER — Other Ambulatory Visit: Payer: Self-pay | Admitting: Neurosurgery

## 2020-09-07 DIAGNOSIS — S065X9A Traumatic subdural hemorrhage with loss of consciousness of unspecified duration, initial encounter: Secondary | ICD-10-CM

## 2020-09-07 DIAGNOSIS — S065XAA Traumatic subdural hemorrhage with loss of consciousness status unknown, initial encounter: Secondary | ICD-10-CM

## 2020-09-08 ENCOUNTER — Encounter (HOSPITAL_COMMUNITY): Payer: Self-pay

## 2020-09-08 ENCOUNTER — Emergency Department (HOSPITAL_COMMUNITY)
Admission: EM | Admit: 2020-09-08 | Discharge: 2020-09-08 | Disposition: A | Discharge status: Home / Self Care | Payer: Self-pay | Attending: Emergency Medicine | Admitting: Emergency Medicine

## 2020-09-08 ENCOUNTER — Other Ambulatory Visit: Payer: Self-pay

## 2020-09-08 ENCOUNTER — Emergency Department (HOSPITAL_COMMUNITY): Payer: Self-pay

## 2020-09-08 DIAGNOSIS — W01190D Fall on same level from slipping, tripping and stumbling with subsequent striking against furniture, subsequent encounter: Secondary | ICD-10-CM | POA: Insufficient documentation

## 2020-09-08 DIAGNOSIS — Y92019 Unspecified place in single-family (private) house as the place of occurrence of the external cause: Secondary | ICD-10-CM | POA: Insufficient documentation

## 2020-09-08 DIAGNOSIS — I10 Essential (primary) hypertension: Secondary | ICD-10-CM | POA: Insufficient documentation

## 2020-09-08 DIAGNOSIS — Z7984 Long term (current) use of oral hypoglycemic drugs: Secondary | ICD-10-CM | POA: Insufficient documentation

## 2020-09-08 DIAGNOSIS — E039 Hypothyroidism, unspecified: Secondary | ICD-10-CM | POA: Insufficient documentation

## 2020-09-08 DIAGNOSIS — G44319 Acute post-traumatic headache, not intractable: Secondary | ICD-10-CM | POA: Insufficient documentation

## 2020-09-08 DIAGNOSIS — F0781 Postconcussional syndrome: Secondary | ICD-10-CM | POA: Insufficient documentation

## 2020-09-08 DIAGNOSIS — J449 Chronic obstructive pulmonary disease, unspecified: Secondary | ICD-10-CM | POA: Insufficient documentation

## 2020-09-08 DIAGNOSIS — S065X9D Traumatic subdural hemorrhage with loss of consciousness of unspecified duration, subsequent encounter: Secondary | ICD-10-CM | POA: Insufficient documentation

## 2020-09-08 DIAGNOSIS — F17210 Nicotine dependence, cigarettes, uncomplicated: Secondary | ICD-10-CM | POA: Insufficient documentation

## 2020-09-08 MED ORDER — ACETAMINOPHEN 500 MG PO TABS
1000.0000 mg | ORAL_TABLET | Freq: Once | ORAL | Status: AC
  Administered 2020-09-08: 1000 mg via ORAL
  Filled 2020-09-08: qty 2

## 2020-09-08 NOTE — ED Notes (Signed)
Discharge instructions provided. Verbalized understanding. Alert and oriented. Ambulated with steady gait out of ed.

## 2020-09-08 NOTE — Discharge Instructions (Signed)
Your experiencing postconcussive syndrome due to previous head injury causing a subdural hematoma.  Please follow instruction below.  Call and follow-up closely with the post concussion clinic, Dr. Antoine Primas for further management.

## 2020-09-08 NOTE — ED Provider Notes (Signed)
[facility]ENC[facility] Provider Note   CSN: [id number] Arrival date & time: 09/08/20  1252     History Chief Complaint  Patient presents with  . Headache    Dawn Kane is a 49 y.o. female.  The history is provided by the patient and medical records. No language interpreter was used.  Headache    49 year old female significant history of hypertension, hyperlipidemia, obesity, COPD, previously had a head injury leading to a subdural hematoma approximately 2 weeks ago presenting complaining of concussive symptoms.  Patient states she was trying to work on her house while standing on a bench when she lost control fell struck her head against a hard coffee table.  She was evaluated initially and was found to have a subdural hematoma to her left frontal lobe.  She also has a left prior occipital scalp hematoma.  She was sent home with medication, but since the injury she endorsed recurrent sharp throbbing headache, more emotional, more moody than usual, nausea, light and sound sensitivity, and lightheadedness with leg weakness.  She was seen at an outside hospital a week ago for evaluation and had a repeat head CT scan.  States that it showed some improvement and patient was sent home.  Her symptoms still persist and family member voiced concern thus prompting this ER visit.  She denies any new symptoms.  She denies any focal numbness or focal weakness facial droop or vomiting.  She has been taking pain medication at home and has tried to rest.  Past Medical History:  Diagnosis Date  . Anxiety   . COPD (chronic obstructive pulmonary disease) (HCC)   . GERD (gastroesophageal reflux disease)   . Hyperlipidemia   . Hypertension   . Hypothyroidism   . Obesity     Patient Active Problem List   Diagnosis Date Noted  . Adnexal mass 06/18/2019  . Pyelonephritis of right kidney 03/03/2019  . Chronic hepatitis C without hepatic coma (HCC) 08/01/2017  . Sepsis  (HCC) 07/09/2016  . CAP (community acquired pneumonia) 07/09/2016  . Anxiety 07/09/2016  . Hypothyroidism 07/09/2016    Past Surgical History:  Procedure Laterality Date  . CLAVICLE SURGERY    . COLONOSCOPY WITH PROPOFOL N/A 07/04/2018   Procedure: COLONOSCOPY WITH PROPOFOL;  Surgeon: Kathi DerBrahmbhatt, Parag, MD;  Location: [facility];  Service: Gastroenterology;  Laterality: N/A;  . CYSTOSCOPY W/ URETERAL STENT PLACEMENT Right 03/03/2019   Procedure: CYSTOSCOPY WITH RETROGRADE PYELOGRAM/URETERAL STENT PLACEMENT;  Surgeon: Jerilee FieldEskridge, Matthew, MD;  Location: [facility] ORS;  Service: Urology;  Laterality: Right;  . CYSTOSCOPY/URETEROSCOPY/HOLMIUM LASER/STENT PLACEMENT Right 03/29/2019   Procedure: CYSTOSCOPY/URETEROSCOPY/HOLMIUM LASER/STENT exchange;  Surgeon: Sondra ComeSninsky, Brian C, MD;  Location: [facility] ORS;  Service: Urology;  Laterality: Right;  . LUNG SURGERY    . MANDIBLE SURGERY    . POLYPECTOMY  07/04/2018   Procedure: POLYPECTOMY;  Surgeon: Kathi DerBrahmbhatt, Parag, MD;  Location: [facility];  Service: Gastroenterology;;     OB History    Gravida  4   Para  2   Term  2   Preterm      AB  2   Living  2     SAB      TAB  2   Ectopic      Multiple      Live Births              Family History  Problem Relation Age of Onset  . Heart disease Paternal Grandmother   . Cancer Paternal Grandfather  pancreatic    Social History   Tobacco Use  . Smoking status: Current Every Day Smoker    Packs/day: 1.50    Types: Cigarettes  . Smokeless tobacco: Never Used  Vaping Use  . Vaping Use: Never used  Substance Use Topics  . Alcohol use: No    Comment: quit a year ago  . Drug use: Yes    Frequency: 3.0 times per week    Types: Marijuana    Home Medications Prior to Admission medications   Medication Sig Start Date End Date Taking? Authorizing Provider  albuterol (PROVENTIL HFA;VENTOLIN HFA) 108 (90 Base) MCG/ACT inhaler Inhale 1-2 puffs into the lungs every 6 (six) hours  as needed for wheezing or shortness of breath.    [provider]  ibuprofen (ADVIL,MOTRIN) 200 MG tablet Take 200-400 mg by mouth every 6 (six) hours as needed for headache or moderate pain.     [provider]  levothyroxine (SYNTHROID) 150 MCG tablet Take 150 mcg by mouth daily before breakfast.     [provider]  Multiple Vitamin (MULTIVITAMIN WITH MINERALS) TABS tablet Take 1 tablet by mouth daily.    [provider]  Omega-3 Fatty Acids (FISH OIL) 1200 MG CAPS Take 1,200 mg by mouth daily.    [provider]  omeprazole (PRILOSEC) 20 MG capsule Take 20 mg by mouth daily as needed (for acid reflux).     [provider]  oxyCODONE-acetaminophen (PERCOCET) 5-325 MG tablet Take 1 tablet by mouth every 4 (four) hours as needed for severe pain. 09/04/20   Minna Antis, MD  tamsulosin (FLOMAX) 0.4 MG CAPS capsule Take 1 capsule (0.4 mg total) by mouth daily after supper. Patient not taking: Reported on 06/18/2019 04/16/19   Jerilee Field, MD    Allergies    Amoxicillin and Aspirin  Review of Systems   Review of Systems  Neurological: Positive for headaches.  All other systems reviewed and are negative.   Physical Exam Updated Vital Signs BP (!) 138/94   Pulse 64   Temp 98.2 F (36.8 C) (Oral)   Resp 18   Ht 5\' 2"  (1.575 m)   Wt 68 kg   LMP 06/17/2016 Comment: neg preg test  SpO2 97%   BMI 27.44 kg/m   Physical Exam Vitals and nursing note reviewed.  Constitutional:      General: She is not in acute distress.    Appearance: She is well-developed.  HENT:     Head:     Comments: Hematoma noted to left occipital region tenderness to palpation. Eyes:     General: No visual field deficit.    Extraocular Movements: Extraocular movements intact.     Conjunctiva/sclera: Conjunctivae normal.     Pupils: Pupils are equal, round, and reactive to light.  Cardiovascular:     Rate and Rhythm: Normal rate and regular  rhythm.     Heart sounds: Normal heart sounds.  Pulmonary:     Effort: Pulmonary effort is normal.     Breath sounds: Normal breath sounds.  Abdominal:     Palpations: Abdomen is soft.  Musculoskeletal:        General: Normal range of motion.     Cervical back: Normal range of motion and neck supple. No rigidity.     Comments: Able to move all 4 extremities with equal strength.  Skin:    Findings: No rash.  Neurological:     Mental Status: She is alert and oriented to person, place,  and time.     GCS: GCS eye subscore is 4. GCS verbal subscore is 5. GCS motor subscore is 6.     Cranial Nerves: No cranial nerve deficit, dysarthria or facial asymmetry.     Sensory: No sensory deficit.     Coordination: Coordination normal.     Gait: Gait normal.     Comments: Able to perform 3 words recall after 10 minutes.  Psychiatric:        Mood and Affect: Mood normal.     ED Results / Procedures / Treatments   Labs (all labs ordered are listed, but only abnormal results are displayed) Labs Reviewed - No data to display  EKG None  Radiology CT Head Wo Contrast  Result Date: 09/08/2020 CLINICAL DATA:  Subdural follow-up EXAM: CT HEAD WITHOUT CONTRAST TECHNIQUE: Contiguous axial images were obtained from the base of the skull through the vertex without intravenous contrast. COMPARISON:  CT 09/04/2020 FINDINGS: Brain: Slight interval decrease in size of previously demonstrated small subdural hematoma at the left cranial vertex, best seen on coronal and sagittal series, coronal image 32 and sagittal image number 41. This measures 4 mm maximum thickness compared with 5 mm previously. It is decreased in the anterior to posterior extent. No significant mass effect or midline shift. No new hemorrhage. Normal ventricle size. Vascular: No hyperdense vessels. Scattered carotid vascular calcification. Skull: Normal. Negative for fracture or focal lesion. Sinuses/Orbits: No acute finding. Other: Decreased  left posterior scalp hematoma. IMPRESSION: 1. Slight interval decrease in size of previously demonstrated small subdural hematoma at the left cranial vertex, now measuring 4 mm maximum thickness compared with 5 mm previously. No significant mass effect or midline shift. No new hemorrhage. 2. Decreased left posterior scalp hematoma. Electronically Signed   By: Jasmine Pang M.D.   On: 09/08/2020 18:40    Procedures Procedures (including critical care time)  Medications Ordered in ED Medications  acetaminophen (TYLENOL) tablet 1,000 mg (1,000 mg Oral Given 09/08/20 1518)    ED Course  I have reviewed the triage vital signs and the nursing notes.  Pertinent labs & imaging results that were available during my care of the patient were reviewed by me and considered in my medical decision making (see chart for details).    MDM Rules/Calculators/A&P                          BP (!) 123/95 (BP Location: Right Arm)   Pulse 60   Temp 97.7 F (36.5 C) (Oral)   Resp 16   Ht 5\' 2"  (1.575 m)   Wt 68 kg   LMP 06/17/2016 Comment: neg preg test  SpO2 100%   BMI 27.44 kg/m   Final Clinical Impression(s) / ED Diagnoses Final diagnoses:  Subdural hematoma due to concussion, with loss of consciousness, subsequent encounter  Post concussion syndrome    Rx / DC Orders ED Discharge Orders    None     2:41 PM Patient with traumatic head injury 2 weeks ago from a fall with subsequent subdural hematoma here with symptoms consistent with postconcussive syndrome.  She does not have any focal neuro deficit, answer questions appropriately, able to perform 3 words recall after 10 minutes.  Will obtain repeat head CT scan for reassessment.  Tylenol given for headache.  7:05 PM Fortunately head CT scan shows slight interval decrease in size of a previously demonstrated small subdural hematoma at the left cranial vertex now measuring 4  mm maximum thickness compared to 5 mm previously.  No significant  mass-effect or midline shift no new hemorrhage.  Decreased left posterior scalp hematoma as well.  I discussed this finding with patient.  I gave patient postconcussive syndrome management protocol and also encourage patient to follow-up with out post concussion clinic for further care.  Work note provided.   Fayrene Helper, PA-C 09/08/20 1906    Pricilla Loveless, MD 09/11/20 1110

## 2020-09-08 NOTE — ED Notes (Signed)
Patient updated on plan of care. Patient frustrated with delay in CT.

## 2020-09-08 NOTE — ED Triage Notes (Signed)
Pt reports hitting her head on a coffee table 2 weeks ago and was seen at [facility] and told she has a subdural hematoma. Pt was discharged and since then she has not had any improvement in her cognition or headaches. Pt a.o in triage.

## 2020-09-08 NOTE — ED Notes (Signed)
Call placed to CT requesting update on when patient would be transported to CT. CT reports 20-30 minutes. Patient updated.

## 2020-09-08 NOTE — ED Notes (Signed)
Patient took self off monitoring. Reports she is frustrated because "I haven't seen anyone but you." Patient educated regarding plan of care and reasoning that CT is taking awhile. CT called and new estimated time received. Patient changed into street clothes stating "I'm so cold." Despite warm blankets. Offered more warm blankets but patient refused. Patient continues yelling at this RN stating "I can't afford this expensive hotel." Patient reminded this is an emergency department and not a hotel. Patient redirected and agreeable to plan of care. Transport at bedside to take patient to CT.

## 2020-09-09 ENCOUNTER — Telehealth: Payer: Self-pay | Admitting: Family Medicine

## 2020-09-09 NOTE — Telephone Encounter (Signed)
Patient called stating that she was referred to [place] from the hospital for a concussion. Injury occurred on 08/30/2020. She has visited the ED twice due to on going issues. Since the injury she had recurrent sharp throbbing headaches, is more emotional and more moody than usual, has nausea, light and sound sensitivity, and lightheadedness with leg weakness.  Please advise.

## 2020-09-17 ENCOUNTER — Telehealth: Payer: Self-pay | Admitting: Physical Therapy

## 2020-09-17 NOTE — Telephone Encounter (Signed)
Called pt and she was out of town w/o her Electrical engineer.  Pt states that she has already scheduled an appt at another location to be seen about her head injury.  I provide her with our office name and phone number in case she was mistaken or in case she changes her mind about being seen at the [facility] Conc[facility] clinic.

## 2020-09-21 ENCOUNTER — Ambulatory Visit: Payer: Self-pay

## 2020-09-29 ENCOUNTER — Other Ambulatory Visit: Payer: Self-pay | Admitting: Neurosurgery

## 2020-09-29 DIAGNOSIS — S065XAA Traumatic subdural hemorrhage with loss of consciousness status unknown, initial encounter: Secondary | ICD-10-CM

## 2020-10-13 ENCOUNTER — Ambulatory Visit
Admission: RE | Admit: 2020-10-13 | Discharge: 2020-10-13 | Disposition: A | Discharge status: Home / Self Care | Payer: Self-pay | Source: Ambulatory Visit | Attending: Neurosurgery | Admitting: Neurosurgery

## 2020-10-13 ENCOUNTER — Other Ambulatory Visit: Payer: Self-pay

## 2020-10-13 DIAGNOSIS — S065XAA Traumatic subdural hemorrhage with loss of consciousness status unknown, initial encounter: Secondary | ICD-10-CM

## 2020-10-13 DIAGNOSIS — S065X9A Traumatic subdural hemorrhage with loss of consciousness of unspecified duration, initial encounter: Secondary | ICD-10-CM | POA: Insufficient documentation

## 2021-03-08 ENCOUNTER — Other Ambulatory Visit: Payer: Self-pay | Admitting: Family

## 2021-05-19 ENCOUNTER — Encounter: Payer: Self-pay | Admitting: Physician Assistant

## 2021-05-19 ENCOUNTER — Other Ambulatory Visit: Payer: Self-pay

## 2021-05-19 ENCOUNTER — Emergency Department
Admission: EM | Admit: 2021-05-19 | Discharge: 2021-05-19 | Disposition: A | Discharge status: Home / Self Care | Payer: Self-pay | Attending: Emergency Medicine | Admitting: Emergency Medicine

## 2021-05-19 ENCOUNTER — Emergency Department: Payer: Self-pay

## 2021-05-19 DIAGNOSIS — I10 Essential (primary) hypertension: Secondary | ICD-10-CM | POA: Insufficient documentation

## 2021-05-19 DIAGNOSIS — M4310 Spondylolisthesis, site unspecified: Secondary | ICD-10-CM

## 2021-05-19 DIAGNOSIS — F17210 Nicotine dependence, cigarettes, uncomplicated: Secondary | ICD-10-CM | POA: Insufficient documentation

## 2021-05-19 DIAGNOSIS — M79604 Pain in right leg: Secondary | ICD-10-CM

## 2021-05-19 DIAGNOSIS — M4316 Spondylolisthesis, lumbar region: Secondary | ICD-10-CM | POA: Insufficient documentation

## 2021-05-19 DIAGNOSIS — E039 Hypothyroidism, unspecified: Secondary | ICD-10-CM | POA: Insufficient documentation

## 2021-05-19 DIAGNOSIS — J449 Chronic obstructive pulmonary disease, unspecified: Secondary | ICD-10-CM | POA: Insufficient documentation

## 2021-05-19 LAB — URINALYSIS, COMPLETE (UACMP) WITH MICROSCOPIC
Bacteria, UA: NONE SEEN
Bilirubin Urine: NEGATIVE
Glucose, UA: NEGATIVE mg/dL
Hgb urine dipstick: NEGATIVE
Ketones, ur: NEGATIVE mg/dL
Nitrite: NEGATIVE
Protein, ur: NEGATIVE mg/dL
Specific Gravity, Urine: 1.008 (ref 1.005–1.030)
pH: 7 (ref 5.0–8.0)

## 2021-05-19 MED ORDER — PREDNISONE 20 MG PO TABS: 40.0000 mg | ORAL_TABLET | Freq: Every day | ORAL | 0 refills | Status: AC

## 2021-05-19 MED ORDER — CYCLOBENZAPRINE HCL 5 MG PO TABS: 5.0000 mg | ORAL_TABLET | Freq: Three times a day (TID) | ORAL | 0 refills | Status: DC | PRN

## 2021-05-19 MED ORDER — HYDROCODONE-ACETAMINOPHEN 5-325 MG PO TABS: 1.0000 | ORAL_TABLET | Freq: Two times a day (BID) | ORAL | 0 refills | Status: DC | PRN

## 2021-05-19 NOTE — ED Triage Notes (Signed)
Pt here with bilateral ankle and leg pain that travels up to her hips. Pt believes that she has sciatica but has not been diagnosed with it. Pain has gotten worse over the last few weeks.

## 2021-05-19 NOTE — ED Notes (Signed)
First Nurse Note: Pt ambulatory into ED c/o bilateral foot and leg pain. Pt is in NAD.

## 2021-05-19 NOTE — ED Notes (Signed)
Urine to lab

## 2021-05-19 NOTE — Discharge Instructions (Addendum)
Your exam and x-ray are showing changes to lumbar spine concerning for degenerative disc disease and spondylolisthesis.  This may be causing some nerve irritation, related to your bilateral leg pain.  Take the prescription medicines as provided.  Follow-up with primary provider for ongoing management, including outpatient MRI and referral to ortho-spine or neurology. Return to the ED if needed.

## 2021-05-19 NOTE — ED Provider Notes (Signed)
[facility] Emergency Department Provider Note ____________________________________________  Time seen: 1132  I have reviewed the triage vital signs and the nursing notes.  HISTORY  Chief Complaint  Leg Pain   HPI Dawn Kane is a 50 y.o. female with the below medical history, presents herself to the ED for evaluation of bilateral ankle and leg pain is been persistent for the last several weeks.  Patient denies any preceding injury, trauma, or fall.  Patient reports pain will travel up her legs towards her hips.  She denies any bladder or bowel incontinence, foot drop, or saddle anesthesia.  She also denies any midline back pain.  Past Medical History:  Diagnosis Date   Anxiety    COPD (chronic obstructive pulmonary disease) (HCC)    GERD (gastroesophageal reflux disease)    Hyperlipidemia    Hypertension    Hypothyroidism    Obesity     Patient Active Problem List   Diagnosis Date Noted   Adnexal mass 06/18/2019   Pyelonephritis of right kidney 03/03/2019   Chronic hepatitis C without hepatic coma (HCC) 08/01/2017   Sepsis (HCC) 07/09/2016   CAP (community acquired pneumonia) 07/09/2016   Anxiety 07/09/2016   Hypothyroidism 07/09/2016    Past Surgical History:  Procedure Laterality Date   CLAVICLE SURGERY     COLONOSCOPY WITH PROPOFOL N/A 07/04/2018   Procedure: COLONOSCOPY WITH PROPOFOL;  Surgeon: Kathi Der, MD;  Location: [facility];  Service: Gastroenterology;  Laterality: N/A;   CYSTOSCOPY W/ URETERAL STENT PLACEMENT Right 03/03/2019   Procedure: CYSTOSCOPY WITH RETROGRADE PYELOGRAM/URETERAL STENT PLACEMENT;  Surgeon: Jerilee Field, MD;  Location: [facility];  Service: Urology;  Laterality: Right;   CYSTOSCOPY/URETEROSCOPY/HOLMIUM LASER/STENT PLACEMENT Right 03/29/2019   Procedure: CYSTOSCOPY/URETEROSCOPY/HOLMIUM LASER/STENT exchange;  Surgeon: Sondra Come, MD;  Location: [facility];  Service: Urology;  Laterality: Right;    LUNG SURGERY     MANDIBLE SURGERY     POLYPECTOMY  07/04/2018   Procedure: POLYPECTOMY;  Surgeon: Kathi Der, MD;  Location: [facility];  Service: Gastroenterology;;    Prior to Admission medications   Medication Sig Start Date End Date Taking? Authorizing Provider  cyclobenzaprine (FLEXERIL) 5 MG tablet Take 1 tablet (5 mg total) by mouth 3 (three) times daily as needed. 05/19/21  Yes Hilbert Briggs, Charlesetta Ivory, PA-C  HYDROcodone-acetaminophen (NORCO) 5-325 MG tablet Take 1 tablet by mouth 2 (two) times daily as needed. 05/19/21  Yes Adelin Ventrella, Charlesetta Ivory, PA-C  predniSONE (DELTASONE) 20 MG tablet Take 2 tablets (40 mg total) by mouth daily with breakfast for 5 days. 05/19/21 05/24/21 Yes Abbott Jasinski, Charlesetta Ivory, PA-C  albuterol (PROVENTIL HFA;VENTOLIN HFA) 108 (90 Base) MCG/ACT inhaler Inhale 1-2 puffs into the lungs every 6 (six) hours as needed for wheezing or shortness of breath.    [provider]  Multiple Vitamins-Minerals (CENTRUM WOMEN) TABS Take 1 tablet by mouth 3 (three) times a week.    [provider]  Omega-3 Fatty Acids (FISH OIL) 1200 MG CAPS Take 1,200 mg by mouth daily.    [provider]  omeprazole (PRILOSEC OTC) 20 MG tablet Take 20 mg by mouth daily as needed (for acid reflux).     [provider]    Allergies Amoxicillin and Aspirin  Family History  Problem Relation Age of Onset   Heart disease Paternal Grandmother    Cancer Paternal Grandfather        pancreatic    Social History Social History   Tobacco Use   Smoking  status: Every Day    Packs/day: 1.50    Types: Cigarettes   Smokeless tobacco: Never  Vaping Use   Vaping Use: Never used  Substance Use Topics   Alcohol use: No    Comment: quit a year ago   Drug use: Yes    Frequency: 3.0 times per week    Types: Marijuana    Review of Systems  Constitutional: Negative for fever. Eyes: Negative for visual changes. ENT: Negative for sore  throat. Cardiovascular: Negative for chest pain. Respiratory: Negative for shortness of breath. Gastrointestinal: Negative for abdominal pain, vomiting and diarrhea. Genitourinary: Negative for dysuria. Musculoskeletal: Positive for back pain. Reports BLE pain Skin: Negative for rash. Neurological: Negative for headaches, focal weakness or numbness. ____________________________________________  PHYSICAL EXAM:  VITAL SIGNS: ED Triage Vitals  Enc Vitals Group     BP 05/19/21 1053 (!) 151/97     Pulse Rate 05/19/21 1053 76     Resp 05/19/21 1053 16     Temp 05/19/21 1053 97.8 F (36.6 C)     Temp Source 05/19/21 1053 Oral     SpO2 05/19/21 1053 97 %     Weight 05/19/21 1054 170 lb (77.1 kg)     Height 05/19/21 1054 5\' 2"  (1.575 m)     Head Circumference --      Peak Flow --      Pain Score 05/19/21 1054 10     Pain Loc --      Pain Edu? --      Excl. in GC? --     Constitutional: Alert and oriented. Well appearing and in no distress. Head: Normocephalic and atraumatic. Cardiovascular: Normal rate, regular rhythm. Normal distal pulses. Respiratory: Normal respiratory effort. No wheezes/rales/rhonchi. Gastrointestinal: Soft and nontender. No distention. Musculoskeletal: Spinal alignment without midline tenderness, spasm, deformity, or step-off.  Patient transitions from sit to stand without assistance.  Negative supine straight leg raise bilaterally.  Nontender with normal range of motion in all extremities.  Neurologic: Cranial nerves II to XII grossly intact.  Normal LE DTRs bilaterally.  Normal gait without ataxia. Normal speech and language. No gross focal neurologic deficits are appreciated. Skin:  Skin is warm, dry and intact. No rash noted. Psychiatric: Mood and affect are normal. Patient exhibits appropriate insight and judgment. ____________________________________________    LABS (pertinent positives/negatives)  Labs Reviewed  URINALYSIS, COMPLETE (UACMP) WITH  MICROSCOPIC - Abnormal; Notable for the following components:      Result Value   Color, Urine STRAW (*)    APPearance CLEAR (*)    Leukocytes,Ua TRACE (*)    All other components within normal limits  ____________________________________________   RADIOLOGY Official radiology report(s): DG Lumbar Spine Complete  Result Date: 05/19/2021 CLINICAL DATA:  Low back pain. Radiation into bilateral legs. Left foot pain. EXAM: LUMBAR SPINE - COMPLETE 4+ VIEW COMPARISON:  CT abdomen/pelvis from Mar 03, 2019. FINDINGS: No definite evidence of acute fracture. Mild height loss and irregularity of superior endplates at L3 and L4 appears similar to prior CT abdomen/pelvis from Mar 03, 2019. 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 Cholecystectomy clips. IMPRESSION: 1. At L5-S1, retrolisthesis with posterior disc height loss and facet arthropathy. Suspected foraminal stenosis at this level, which could cause the patient's reported symptoms of foot/leg pain. Consider MRI to better evaluate the canal/foramina. 2. No definite evidence of acute fracture. Mild superior endplate irregularity and height loss at L3 and L4 appears similar to  prior CT, likely degenerative remodeling. Electronically Signed   By: Feliberto Harts MD   On: 05/19/2021 13:26   DG Foot Complete Left  Result Date: 05/19/2021 CLINICAL DATA:  Left foot pain EXAM: LEFT FOOT - COMPLETE 3+ VIEW COMPARISON:  None. FINDINGS: Mild hallux valgus. Mild degenerative change first MTP. Bunion formation. Negative for fracture. IMPRESSION: Bunion great toe.  No acute abnormality Electronically Signed   By: Marlan Palau M.D.   On: 05/19/2021 13:21   ____________________________________________  PROCEDURES   Procedures ____________________________________________   INITIAL IMPRESSION / ASSESSMENT AND PLAN / ED COURSE  As part of my medical decision making, I reviewed the following data within the  electronic MEDICAL RECORD NUMBER Radiograph reviewed as noted and Notes from prior ED visits     DDX: DDD, lumbar radiculopathy, lumbar strain  Patient ED evaluation of back pain with some referral into the area buttocks and legs, and some intermittent heel and ankle pain.  She is evaluated for complaints in the ED, found to have no red flags on exam.  She would endorse symptoms since mechanical fall in November, but has not had any interim mitten evaluation.  She is evaluated with an x-ray here which does reveal some retrolisthesis of L5 on S1.  This could indicate a source of stenosis and some radicular symptoms mimicking the patient's pain.  She is advised to follow-up with her primary provider for further evaluation and referral for outpatient MRI if necessary.  Otherwise patient can be evaluated by orthospine for ongoing symptoms.  Return precautions have been reviewed.  She is discharged with a prescription for cyclobenzaprine, hydrocodone, and prednisone.  Dawn Kane was evaluated in Emergency Department on 05/19/2021 for the symptoms described in the history of present illness. She was evaluated in the context of the global COVID-19 pandemic, which necessitated consideration that the patient might be at risk for infection with the SARS-CoV-2 virus that causes COVID-19. Institutional protocols and algorithms that pertain to the evaluation of patients at risk for COVID-19 are in a state of rapid change based on information released by regulatory bodies including the CDC and federal and state organizations. These policies and algorithms were followed during the patient's care in the ED. ____________________________________________  FINAL CLINICAL IMPRESSION(S) / ED DIAGNOSES  Final diagnoses:  Bilateral leg pain  Retrolisthesis      Karmen Stabs, Charlesetta Ivory, PA-C 05/19/21 1715    Delton Prairie, MD 05/20/21 1517

## 2021-06-08 DIAGNOSIS — Z0000 Encounter for general adult medical examination without abnormal findings: Secondary | ICD-10-CM | POA: Diagnosis not present

## 2021-06-08 DIAGNOSIS — Z1239 Encounter for other screening for malignant neoplasm of breast: Secondary | ICD-10-CM | POA: Diagnosis not present

## 2021-06-08 DIAGNOSIS — Z113 Encounter for screening for infections with a predominantly sexual mode of transmission: Secondary | ICD-10-CM | POA: Diagnosis not present

## 2021-06-12 LAB — COLOGUARD: Cologuard Result: NEGATIVE

## 2021-09-30 ENCOUNTER — Other Ambulatory Visit: Payer: Self-pay | Admitting: Nurse Practitioner

## 2021-09-30 DIAGNOSIS — Z122 Encounter for screening for malignant neoplasm of respiratory organs: Secondary | ICD-10-CM

## 2021-09-30 DIAGNOSIS — Z720 Tobacco use: Secondary | ICD-10-CM

## 2021-10-04 ENCOUNTER — Telehealth: Payer: Self-pay

## 2021-10-04 NOTE — Telephone Encounter (Signed)
Telephoned patient, patient will call back and schedule [facility] appointment.

## 2022-01-23 IMAGING — CT CT HEAD W/O CM
3 series · 14 of 46 positions shown, 16 images · non-contrast
Comparison: No pertinent prior exams are available for comparison.
COMPARISON: No pertinent prior exams are available for comparison.

Addendum:
CLINICAL DATA: Head trauma, moderate/severe. Headache, dizziness,
sensitivity to light.

EXAM:
CT HEAD WITHOUT CONTRAST
TECHNIQUE: Contiguous axial images were obtained from the base of the skull
through the vertex without intravenous contrast.

[Series 2: head wo · axial · 0.41mm/px · z∈[+815,+935]mm · 8 of 29 slices shown, 10 images]
[im 3/29  brain]
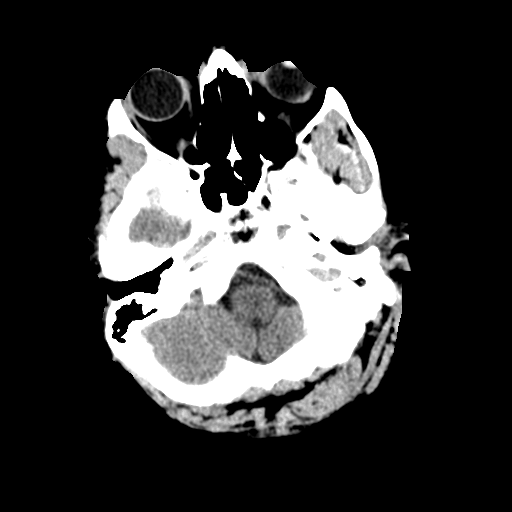
[im 3/29  bone]
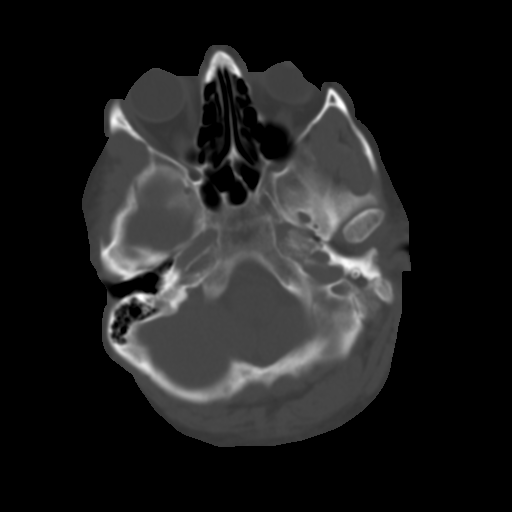
[im 7/29  brain]
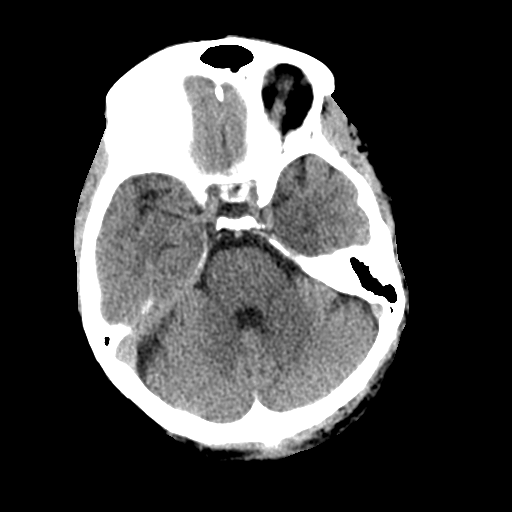
[im 10/29  brain]
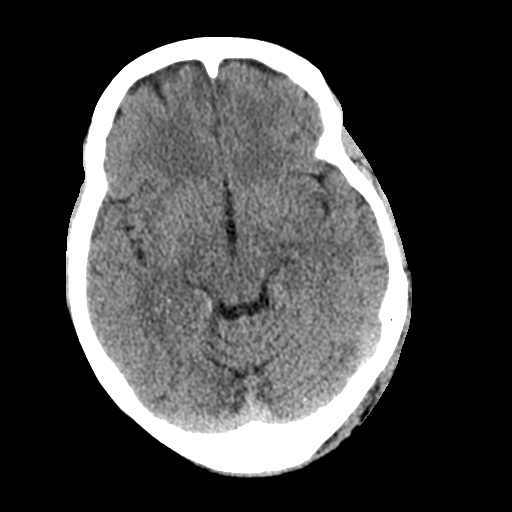
[im 13/29  brain]
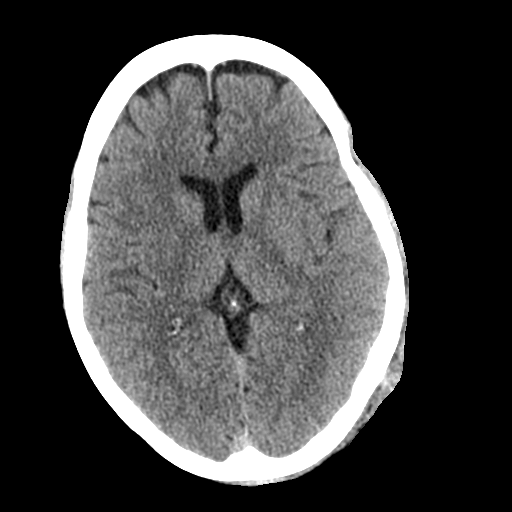
[im 17/29  brain]
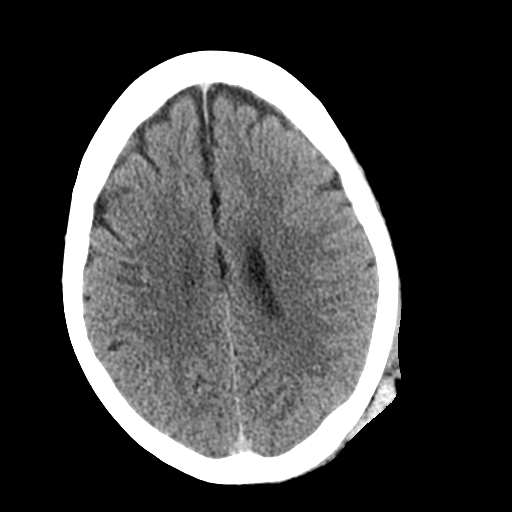
[im 17/29  bone]
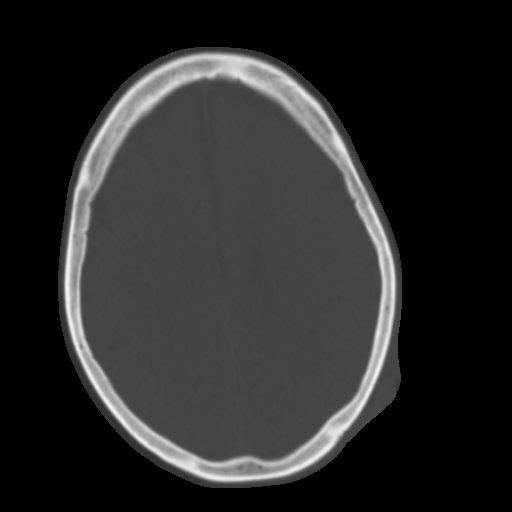
[im 20/29  brain]
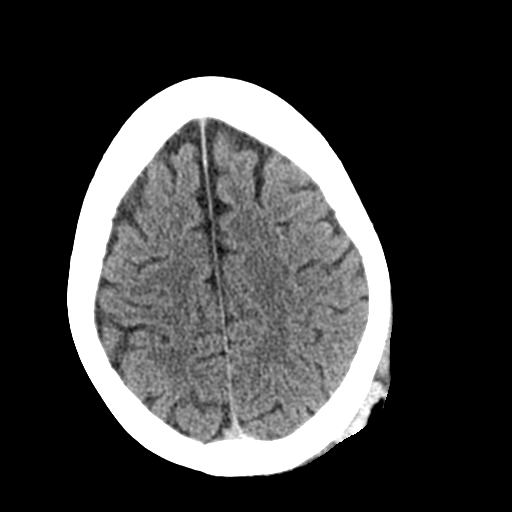
[im 23/29  brain]
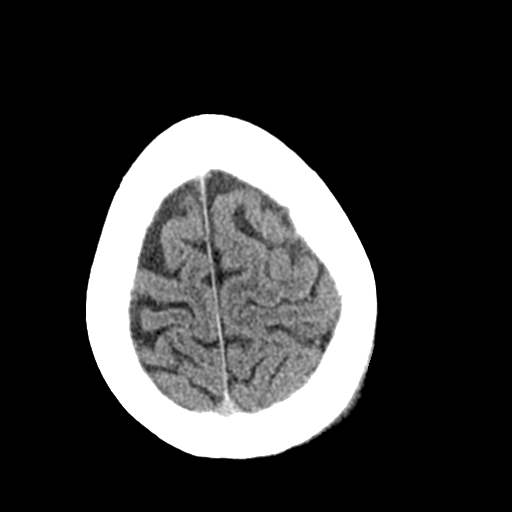
[im 27/29  brain]
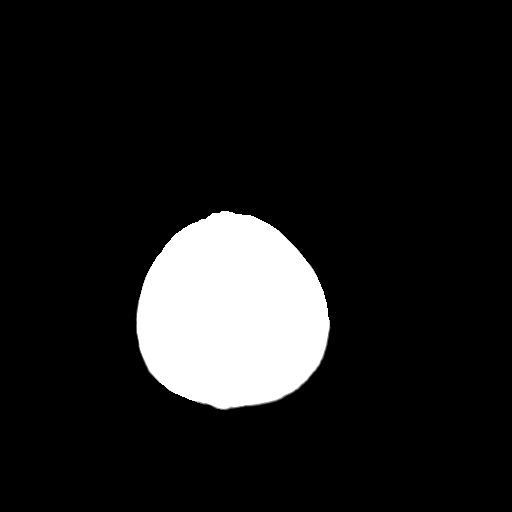

[Series 4: coronal soft tissue · coronal · 0.29mm/px · 3 of 66 slices shown]
[im 22/66  brain]
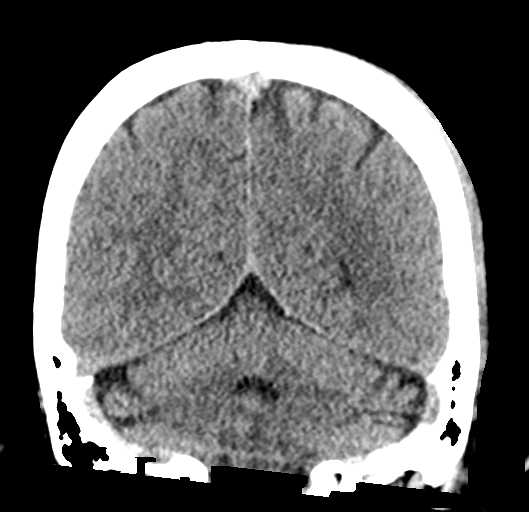
[im 29/66  brain]
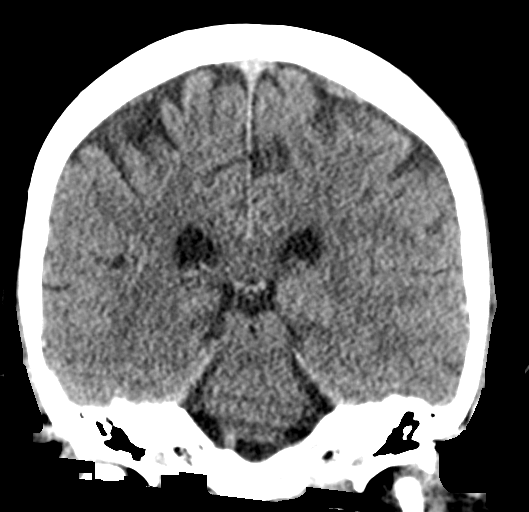
[im 37/66  brain]
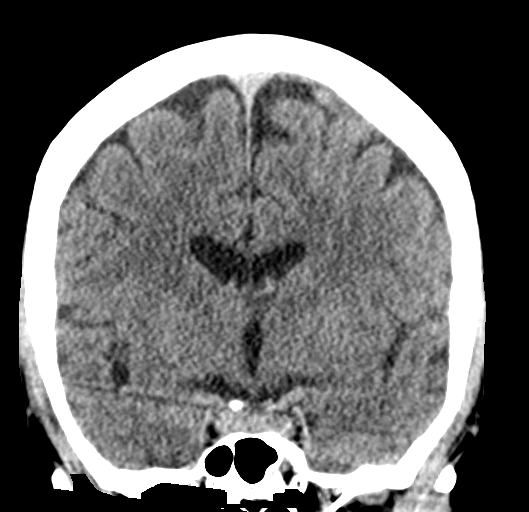

[Series 5: sagittal soft tissue · sagittal · 0.29mm/px · 3 of 54 slices shown]
[im 19/54  brain]
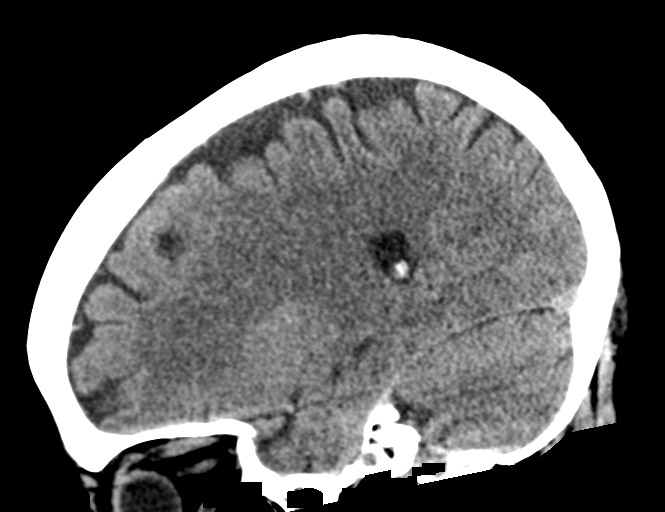
[im 27/54  brain]
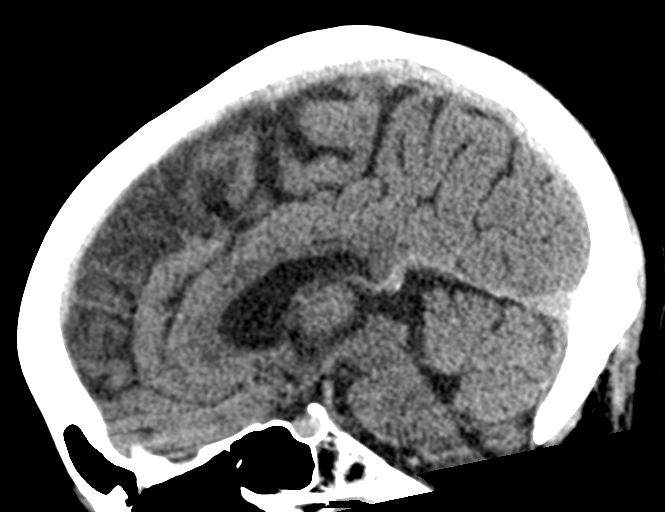
[im 36/54  brain]
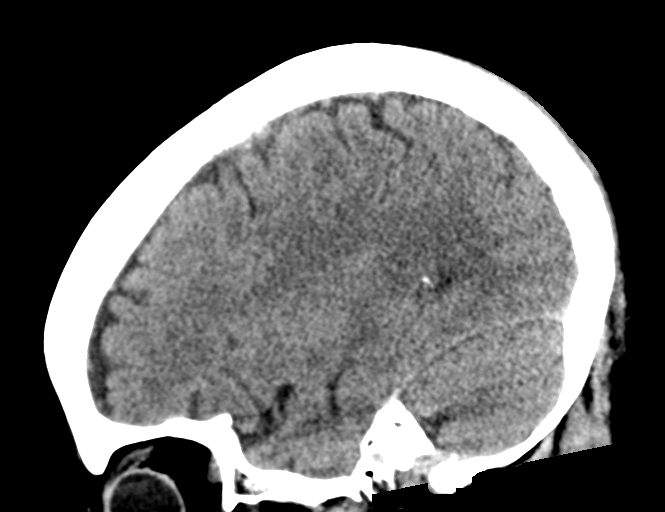

[14 of 46 positions shown; findings below may reference images not displayed]

FINDINGS: Brain:

Cerebral volume is normal for age.

Focal extra-axial hyperdensity overlying the high left frontal lobe,
measuring up to 5 mm in thickness and likely reflecting a small
acute/early subacute subdural hematoma (for instance as seen on
coronal image 26 and sagittal image 34). No significant mass effect
upon the underlying left cerebral hemisphere. No midline shift.

No demarcated cortical infarct.

No evidence of intracranial mass.

Vascular: No hyperdense vessel.  Atherosclerotic calcifications.

Skull: Normal. Negative for fracture or focal lesion.

Sinuses/Orbits: Visualized orbits show no acute finding. No
significant paranasal sinus disease at the imaged levels.

Other: Left parietooccipital scalp hematoma.
IMPRESSION: Small focal acute/early subacute subdural hematoma overlying the
high left frontal lobe, measuring up to 5 mm in thickness. No
significant mass effect upon the underlying left cerebral
hemisphere.

Left parietooccipital scalp hematoma.

ADDENDUM:
These results were called by telephone at the time of interpretation
on 09/04/2020 at [DATE] to provider Dr. Awa, who verbally
acknowledged these results.

*** End of Addendum ***
FINDINGS: Brain:

Cerebral volume is normal for age.

Focal extra-axial hyperdensity overlying the high left frontal lobe,
measuring up to 5 mm in thickness and likely reflecting a small
acute/early subacute subdural hematoma (for instance as seen on
coronal image 26 and sagittal image 34). No significant mass effect
upon the underlying left cerebral hemisphere. No midline shift.

No demarcated cortical infarct.

No evidence of intracranial mass.

Vascular: No hyperdense vessel.  Atherosclerotic calcifications.

Skull: Normal. Negative for fracture or focal lesion.

Sinuses/Orbits: Visualized orbits show no acute finding. No
significant paranasal sinus disease at the imaged levels.

Other: Left parietooccipital scalp hematoma.
IMPRESSION: Small focal acute/early subacute subdural hematoma overlying the
high left frontal lobe, measuring up to 5 mm in thickness. No
significant mass effect upon the underlying left cerebral
hemisphere.

Left parietooccipital scalp hematoma.

## 2022-06-27 ENCOUNTER — Other Ambulatory Visit (INDEPENDENT_AMBULATORY_CARE_PROVIDER_SITE_OTHER): Payer: Self-pay

## 2022-07-23 ENCOUNTER — Other Ambulatory Visit: Payer: Self-pay | Admitting: Family

## 2022-09-16 DIAGNOSIS — Z419 Encounter for procedure for purposes other than remedying health state, unspecified: Secondary | ICD-10-CM | POA: Diagnosis not present

## 2022-09-30 ENCOUNTER — Other Ambulatory Visit: Payer: Self-pay | Admitting: Nurse Practitioner

## 2022-09-30 DIAGNOSIS — Z720 Tobacco use: Secondary | ICD-10-CM

## 2022-09-30 DIAGNOSIS — Z122 Encounter for screening for malignant neoplasm of respiratory organs: Secondary | ICD-10-CM

## 2022-10-13 ENCOUNTER — Inpatient Hospital Stay: Admission: RE | Admit: 2022-10-13 | Payer: Medicaid Other | Source: Ambulatory Visit

## 2022-10-17 DIAGNOSIS — Z419 Encounter for procedure for purposes other than remedying health state, unspecified: Secondary | ICD-10-CM | POA: Diagnosis not present

## 2022-11-17 DIAGNOSIS — Z419 Encounter for procedure for purposes other than remedying health state, unspecified: Secondary | ICD-10-CM | POA: Diagnosis not present

## 2022-12-02 ENCOUNTER — Ambulatory Visit
Admission: RE | Admit: 2022-12-02 | Discharge: 2022-12-02 | Disposition: A | Discharge status: Home / Self Care | Payer: Medicaid Other | Source: Ambulatory Visit | Attending: Internal Medicine | Admitting: Internal Medicine

## 2022-12-02 ENCOUNTER — Ambulatory Visit
Admission: RE | Admit: 2022-12-02 | Discharge: 2022-12-02 | Disposition: A | Discharge status: Home / Self Care | Payer: Medicaid Other | Attending: Internal Medicine | Admitting: Internal Medicine

## 2022-12-02 ENCOUNTER — Encounter: Payer: Self-pay | Admitting: Internal Medicine

## 2022-12-02 ENCOUNTER — Ambulatory Visit: Payer: Medicaid Other | Admitting: Internal Medicine

## 2022-12-02 VITALS — BP 132/82 | HR 78 | Temp 97.8°F | Resp 18 | Ht 62.5 in | Wt 166.2 lb

## 2022-12-02 DIAGNOSIS — E039 Hypothyroidism, unspecified: Secondary | ICD-10-CM | POA: Diagnosis not present

## 2022-12-02 DIAGNOSIS — Z1231 Encounter for screening mammogram for malignant neoplasm of breast: Secondary | ICD-10-CM | POA: Diagnosis not present

## 2022-12-02 DIAGNOSIS — Z8679 Personal history of other diseases of the circulatory system: Secondary | ICD-10-CM | POA: Diagnosis not present

## 2022-12-02 DIAGNOSIS — Z13220 Encounter for screening for lipoid disorders: Secondary | ICD-10-CM

## 2022-12-02 DIAGNOSIS — M5440 Lumbago with sciatica, unspecified side: Secondary | ICD-10-CM

## 2022-12-02 DIAGNOSIS — G8929 Other chronic pain: Secondary | ICD-10-CM | POA: Insufficient documentation

## 2022-12-02 DIAGNOSIS — M545 Low back pain, unspecified: Secondary | ICD-10-CM | POA: Diagnosis not present

## 2022-12-02 DIAGNOSIS — B182 Chronic viral hepatitis C: Secondary | ICD-10-CM

## 2022-12-02 DIAGNOSIS — K219 Gastro-esophageal reflux disease without esophagitis: Secondary | ICD-10-CM | POA: Diagnosis not present

## 2022-12-02 DIAGNOSIS — Z23 Encounter for immunization: Secondary | ICD-10-CM | POA: Diagnosis not present

## 2022-12-02 DIAGNOSIS — T7840XA Allergy, unspecified, initial encounter: Secondary | ICD-10-CM | POA: Diagnosis not present

## 2022-12-02 DIAGNOSIS — J449 Chronic obstructive pulmonary disease, unspecified: Secondary | ICD-10-CM | POA: Diagnosis not present

## 2022-12-02 MED ORDER — LORATADINE 10 MG PO TABS: 10.0000 mg | ORAL_TABLET | Freq: Every day | ORAL | 11 refills | Status: AC

## 2022-12-02 MED ORDER — ALBUTEROL SULFATE HFA 108 (90 BASE) MCG/ACT IN AERS: 1.0000 | INHALATION_SPRAY | Freq: Four times a day (QID) | RESPIRATORY_TRACT | 2 refills | Status: DC | PRN

## 2022-12-02 MED ORDER — MELOXICAM 7.5 MG PO TABS: 7.5000 mg | ORAL_TABLET | ORAL | 0 refills | Status: DC | PRN

## 2022-12-02 MED ORDER — IPRATROPIUM-ALBUTEROL 0.5-2.5 (3) MG/3ML IN SOLN: 3.0000 mL | RESPIRATORY_TRACT | 2 refills | Status: AC | PRN
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
## 2022-12-02 NOTE — Patient Instructions (Addendum)
It was great seeing you today!  Plan discussed at today's visit: -Blood work ordered today, results will be uploaded to [place].  -Mammogram ordered, call number on card to schedule -Go across the street to get x-ray today -Inhalers refilled, try Claritin for allergies and Mobic refilled  -Tdap today   Follow up in: 1 month for Pap   Take care and let us know if you have any questions or concerns prior to your next visit.  Dr. Rosana Berger

## 2022-12-06 NOTE — Addendum Note (Signed)
Addended by: Teodora Medici on: 12/06/2022 09:00 AM   Modules accepted: Orders

## 2022-12-07 MED ORDER — LEVOTHYROXINE SODIUM 112 MCG PO TABS: 112.0000 ug | ORAL_TABLET | Freq: Every day | ORAL | 1 refills | Status: DC

## 2022-12-07 NOTE — Addendum Note (Signed)
Addended by: Teodora Medici on: 12/07/2022 08:24 AM   Modules accepted: Orders

## 2022-12-08 LAB — CBC WITH DIFFERENTIAL/PLATELET
Absolute Monocytes: 838 cells/uL (ref 200–950)
Basophils Absolute: 94 cells/uL (ref 0–200)
Basophils Relative: 0.8 %
Eosinophils Absolute: 330 cells/uL (ref 15–500)
Eosinophils Relative: 2.8 %
HCT: 42.3 % (ref 35.0–45.0)
Hemoglobin: 14.4 g/dL (ref 11.7–15.5)
Lymphs Abs: 3221 cells/uL (ref 850–3900)
MCH: 29.1 pg (ref 27.0–33.0)
MCHC: 34 g/dL (ref 32.0–36.0)
MCV: 85.6 fL (ref 80.0–100.0)
MPV: 10.9 fL (ref 7.5–12.5)
Monocytes Relative: 7.1 %
Neutro Abs: 7316 cells/uL (ref 1500–7800)
Neutrophils Relative %: 62 %
Platelets: 317 10*3/uL (ref 140–400)
RBC: 4.94 10*6/uL (ref 3.80–5.10)
RDW: 13.7 % (ref 11.0–15.0)
Total Lymphocyte: 27.3 %
WBC: 11.8 10*3/uL — ABNORMAL HIGH (ref 3.8–10.8)

## 2022-12-08 LAB — THYROID PROFILE - CHCC
Free Thyroxine Index: 3.3 (ref 1.4–3.8)
T3 Uptake: 25 % (ref 22–35)
T4, Total: 13.2 ug/dL — ABNORMAL HIGH (ref 5.1–11.9)

## 2022-12-08 LAB — HEPATITIS C ANTIBODY: Hepatitis C Ab: REACTIVE — AB

## 2022-12-08 LAB — COMPLETE METABOLIC PANEL WITH GFR
AG Ratio: 1.1 (calc) (ref 1.0–2.5)
ALT: 18 U/L (ref 6–29)
AST: 12 U/L (ref 10–35)
Albumin: 4.1 g/dL (ref 3.6–5.1)
Alkaline phosphatase (APISO): 77 U/L (ref 37–153)
BUN: 16 mg/dL (ref 7–25)
CO2: 26 mmol/L (ref 20–32)
Calcium: 9.6 mg/dL (ref 8.6–10.4)
Chloride: 104 mmol/L (ref 98–110)
Creat: 0.9 mg/dL (ref 0.50–1.03)
Globulin: 3.6 g/dL (calc) (ref 1.9–3.7)
Glucose, Bld: 83 mg/dL (ref 65–99)
Potassium: 4.4 mmol/L (ref 3.5–5.3)
Sodium: 140 mmol/L (ref 135–146)
Total Bilirubin: 0.2 mg/dL (ref 0.2–1.2)
Total Protein: 7.7 g/dL (ref 6.1–8.1)
eGFR: 77 mL/min/{1.73_m2} (ref >=60)

## 2022-12-08 LAB — LIPID PANEL
Cholesterol: 291 mg/dL — ABNORMAL HIGH (ref <200)
HDL: 43 mg/dL — ABNORMAL LOW (ref >=50)
LDL Cholesterol (Calc): 203 mg/dL (calc) — ABNORMAL HIGH
Non-HDL Cholesterol (Calc): 248 mg/dL (calc) — ABNORMAL HIGH (ref <130)
Total CHOL/HDL Ratio: 6.8 (calc) — ABNORMAL HIGH (ref <5.0)
Triglycerides: 245 mg/dL — ABNORMAL HIGH (ref <150)

## 2022-12-08 LAB — TSH: TSH: 0.28 mIU/L — ABNORMAL LOW

## 2022-12-08 LAB — TEST AUTHORIZATION

## 2022-12-08 LAB — HCV RNA,QUANTITATIVE REAL TIME PCR
HCV Quantitative Log: 3.41 Log IU/mL — ABNORMAL HIGH
HCV RNA, PCR, QN: 2570 IU/mL — ABNORMAL HIGH

## 2022-12-08 LAB — HEPATITIS C RNA QUANTITATIVE
HCV Quantitative Log: 4 log IU/mL — ABNORMAL HIGH
HCV RNA, PCR, QN: 10000 IU/mL — ABNORMAL HIGH

## 2022-12-08 NOTE — Progress Notes (Signed)
   Acute Office Visit  Subjective:     Patient ID: Dawn Kane, female    DOB: [birth year], 52 y.o.   MRN: [id number]  Chief Complaint  Patient presents with   sciatica pain    Left side    HPI Patient is in today for sciatica. She does have chronic back pain, x-ray was obtained 12/02/2022, which showed loss of disc height noted from L3-L4 and S5-S1 with endplate degeneration.  No substantial change since her last imaging in 2022.  Her sciatica symptoms started 6 days ago.  She rolled over to get up out of bed and felt a "popping" sensation in her left lower back.  Pain is grabbing and achy in nature. Pain is on the left side. Symptoms are slowly improving but pain radiating down into the foot. Pain worse with walking.  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  Taking Mobic and Gabapentin, which does not control her current sciatica flare or her chronic back pain.  Review of Systems  Musculoskeletal:  Positive for back pain.        Objective:    BP 128/84   Pulse 89   Temp 97.9 F (36.6 C)   Resp 18   Ht 5' 2.5" (1.588 m)   Wt 168 lb 3.2 oz (76.3 kg)   LMP 06/17/2016 Comment: neg preg test  SpO2 97%   BMI 30.27 kg/m    Physical Exam Constitutional:      Appearance: Normal appearance.  HENT:     Head: Normocephalic and atraumatic.  Eyes:     Conjunctiva/sclera: Conjunctivae normal.  Cardiovascular:     Rate and Rhythm: Normal rate and regular rhythm.  Pulmonary:     Effort: Pulmonary effort is normal.     Breath sounds: Normal breath sounds.  Musculoskeletal:     Lumbar back: Spasms and tenderness present. No swelling, deformity or bony tenderness. Decreased range of motion.  Skin:    General: Skin is warm and dry.  Neurological:     General: No focal deficit present.     Mental Status: She is alert. Mental status is at baseline.  Psychiatric:        Mood and Affect: Mood normal.         Behavior: Behavior normal.     No results found for any visits on 12/09/22.      Assessment & Plan:   1. Sciatica of left side: Discontinue Mobic, start naproxen 500 mg twice daily x 1 week.  Discussed taking medication with food and avoiding all other anti-inflammatories while on the medication.  Will also prescribe tizanidine 2 mg to take as needed for muscle spasm.  Discussed using moist heat topical medications and stretches as well as massage to help with pain.  Sciatica stretches printed for the patient.  Follow-up scheduled in March.  - naproxen (NAPROSYN) 500 MG tablet; Take 1 tablet (500 mg total) by mouth 2 (two) times daily with a meal for 7 days.  Dispense: 14 tablet; Refill: 0 - tizanidine (ZANAFLEX) 2 MG capsule; Take 1 capsule (2 mg total) by mouth at bedtime as needed for muscle spasms.  Dispense: 30 capsule; Refill: 0  Return if symptoms worsen or fail to improve.  Teodora Medici, DO

## 2022-12-09 ENCOUNTER — Ambulatory Visit: Payer: Medicaid Other | Admitting: Internal Medicine

## 2022-12-09 ENCOUNTER — Encounter: Payer: Self-pay | Admitting: Internal Medicine

## 2022-12-09 ENCOUNTER — Other Ambulatory Visit: Payer: Self-pay

## 2022-12-09 VITALS — BP 128/84 | HR 89 | Temp 97.9°F | Resp 18 | Ht 62.5 in | Wt 168.2 lb

## 2022-12-09 DIAGNOSIS — M5432 Sciatica, left side: Secondary | ICD-10-CM

## 2022-12-09 MED ORDER — TIZANIDINE HCL 2 MG PO CAPS: 2.0000 mg | ORAL_CAPSULE | Freq: Every evening | ORAL | 0 refills | Status: DC | PRN

## 2022-12-09 MED ORDER — NAPROXEN 500 MG PO TABS: 500.0000 mg | ORAL_TABLET | Freq: Two times a day (BID) | ORAL | 0 refills | Status: AC

## 2022-12-09 NOTE — Patient Instructions (Addendum)
It was great seeing you today!  Plan discussed at today's visit: -Take Naproxen 500 mg twice a day with food for 1 week, do NOT take any other anti-inflammatories (including Meloxicam) at the same time -Low dose muscle relaxer sent to pharmacy as well, take at night as needed (do not take and drive) -Try heat and stretches as well   Follow up in: already scheduled   Take care and let us know if you have any questions or concerns prior to your next visit.  Dr. Rosana Berger  Sciatica Rehab Ask your health care provider which exercises are safe for you. Do exercises exactly as told by your health care provider and adjust them as directed. It is normal to feel mild stretching, pulling, tightness, or discomfort as you do these exercises. Stop right away if you feel sudden pain or your pain gets worse. Do not begin these exercises until told by your health care provider. Stretching and range-of-motion exercises These exercises warm up your muscles and joints and improve the movement and flexibility of your hips and back. These exercises also help to relieve pain, numbness, and tingling. Sciatic nerve glide  Sit in a chair with your head facing down toward your chest. Place your hands behind your back. Let your shoulders slump forward. Slowly straighten one of your legs while you tilt your head back as if you are looking toward the ceiling. Only straighten your leg as far as you can without making your symptoms worse. Hold this position for __________ seconds. Slowly return your leg and head back to the starting position. Repeat with your other leg. Repeat __________ times. Complete this exercise __________ times a day. Knee to chest with hip adduction and internal rotation  Lie on your back on a firm surface with both legs straight. Bend one of your knees and move it up toward your chest until you feel a gentle stretch in your lower back and buttock. Then, move your knee toward the shoulder that is  on the opposite side from your leg. This is hip adduction and internal rotation. Hold your leg in this position by holding on to the front of your knee. Hold this position for __________ seconds. Slowly return to the starting position. Repeat with your other leg. Repeat __________ times. Complete this exercise __________ times a day. Prone extension on elbows  Lie on your abdomen on a firm surface. A bed may be too soft for this exercise. Prop yourself up on your elbows. Use your arms to help lift your chest up until you feel a gentle stretch in your abdomen and your lower back. This will place some of your body weight on your elbows. If this is uncomfortable, try stacking pillows under your chest. Your hips should stay down, against the surface that you are lying on. Keep your hip and back muscles relaxed. Hold this position for __________ seconds. Slowly relax your upper body and return to the starting position. Repeat __________ times. Complete this exercise __________ times a day. Strengthening exercises These exercises build strength and endurance in your back. Endurance is the ability to use your muscles for a long time, even after they get tired. Pelvic tilt This exercise strengthens the muscles that lie deep in the abdomen. Lie on your back on a firm surface. Bend your knees and keep your feet flat on the surface. Tense your abdominal muscles. Tip your pelvis up toward the ceiling and flatten your lower back into the firm surface. To help with  this exercise, you may place a small towel under your lower back and try to push your back into the towel. Hold this position for __________ seconds. Let your muscles relax completely before you repeat this exercise. Repeat __________ times. Complete this exercise __________ times a day. Alternating arm and leg raises  Get on your hands and knees on a firm surface. If you are on a hard floor, you may want to use padding, such as an exercise  mat, to cushion your knees. Line up your arms and legs. Your hands should be directly below your shoulders, and your knees should be directly below your hips. Lift your left leg behind you. At the same time, raise your right arm and straighten it in front of you. Do not lift your leg higher than your hip. Do not lift your arm higher than your shoulder. Keep your abdominal and back muscles tight. Keep your hips facing the ground. Do not arch your back. Keep your balance carefully, and do not hold your breath. Hold this position for __________ seconds. Slowly return to the starting position. Repeat with your right leg and your left arm. Repeat __________ times. Complete this exercise __________ times a day. Posture and body mechanics Good posture and healthy body mechanics can help to relieve stress in your body's tissues and joints. Body mechanics refers to the movements and positions of your body while you do your daily activities. Posture is part of body mechanics. Good posture means: Your spine is in its natural S-curve position (neutral). Your shoulders are pulled back slightly. Your head is not tipped forward. Follow these guidelines to improve your posture and body mechanics in your everyday activities. Standing  When standing, keep your spine neutral and your feet about hip width apart. Keep a slight bend in your knees. Your ears, shoulders, and hips should line up. When you do a task in which you stand in one place for a long time, place one foot up on a stable object that is 2-4 inches (5-10 cm) high, such as a footstool. This helps keep your spine neutral. Sitting  When sitting, keep your spine neutral and keep your feet flat on the floor. Use a footrest, if necessary, and keep your thighs parallel to the floor. Avoid rounding your shoulders, and avoid tilting your head forward. When working at a desk or a computer, keep your desk at a height where your hands are slightly lower  than your elbows. Slide your chair under your desk so you are close enough to maintain good posture. When working at a computer, place your monitor at a height where you are looking straight ahead and you do not have to tilt your head forward or downward to look at the screen. Resting  When lying down and resting, avoid positions that are most painful for you. If you have pain with activities such as sitting, bending, stooping, or squatting, lie in a position in which your body does not bend very much. For example, avoid curling up on your side with your arms and knees near your chest (fetal position). If you have pain with activities such as standing for a long time or reaching with your arms, lie with your spine in a neutral position and bend your knees slightly. Try the following positions: Lying on your side with a pillow between your knees. Lying on your back with a pillow under your knees. Lifting  When lifting objects, keep your feet at least shoulder width apart and  tighten your abdominal muscles. Bend your knees and hips and keep your spine neutral. It is important to lift using the strength of your legs, not your back. Do not lock your knees straight out. Always ask for help to lift heavy or awkward objects. This information is not intended to replace advice given to you by your health care provider. Make sure you discuss any questions you have with your health care provider. Document Revised: 01/11/2022 Document Reviewed: 01/11/2022 Elsevier Patient Education  [place].

## 2022-12-14 ENCOUNTER — Ambulatory Visit: Payer: Medicaid Other | Admitting: Internal Medicine

## 2022-12-16 DIAGNOSIS — Z419 Encounter for procedure for purposes other than remedying health state, unspecified: Secondary | ICD-10-CM | POA: Diagnosis not present

## 2022-12-22 ENCOUNTER — Ambulatory Visit
Admission: RE | Admit: 2022-12-22 | Discharge: 2022-12-22 | Disposition: A | Discharge status: Home / Self Care | Payer: Medicaid Other | Source: Ambulatory Visit | Attending: Internal Medicine | Admitting: Internal Medicine

## 2022-12-22 DIAGNOSIS — Z1231 Encounter for screening mammogram for malignant neoplasm of breast: Secondary | ICD-10-CM | POA: Diagnosis not present

## 2022-12-26 ENCOUNTER — Other Ambulatory Visit: Payer: Self-pay | Admitting: Internal Medicine

## 2022-12-26 DIAGNOSIS — N63 Unspecified lump in unspecified breast: Secondary | ICD-10-CM

## 2022-12-26 DIAGNOSIS — R928 Other abnormal and inconclusive findings on diagnostic imaging of breast: Secondary | ICD-10-CM

## 2022-12-31 ENCOUNTER — Other Ambulatory Visit: Payer: Self-pay | Admitting: Internal Medicine

## 2022-12-31 DIAGNOSIS — E039 Hypothyroidism, unspecified: Secondary | ICD-10-CM

## 2022-12-31 DIAGNOSIS — M5432 Sciatica, left side: Secondary | ICD-10-CM

## 2023-01-02 ENCOUNTER — Other Ambulatory Visit: Payer: Self-pay | Admitting: Internal Medicine

## 2023-01-02 DIAGNOSIS — M5432 Sciatica, left side: Secondary | ICD-10-CM

## 2023-01-02 DIAGNOSIS — E039 Hypothyroidism, unspecified: Secondary | ICD-10-CM

## 2023-01-02 MED ORDER — TIZANIDINE HCL 2 MG PO CAPS: 2.0000 mg | ORAL_CAPSULE | Freq: Every evening | ORAL | 0 refills | Status: DC | PRN

## 2023-01-02 MED ORDER — LEVOTHYROXINE SODIUM 112 MCG PO TABS: 112.0000 ug | ORAL_TABLET | Freq: Every day | ORAL | 0 refills | Status: DC

## 2023-01-02 NOTE — Telephone Encounter (Signed)
Requested medication (s) are due for refill today: yes  Requested medication (s) are on the active medication list: yes  Last refill:  12/09/22  Future visit scheduled: yes  Notes to clinic:  Pharmacy comment: REQUEST FOR 90 DAYS PRESCRIPTION. DX Code Needed.      Requested Prescriptions  Pending Prescriptions Disp Refills   tiZANidine (ZANAFLEX) 2 MG tablet [Pharmacy Med Name: TIZANIDINE HCL 2 MG TABLET] 90 tablet 1    Sig: TAKE 1 TABLET (2 MG TOTAL) BY MOUTH AT BEDTIME AS NEEDED FOR MUSCLE SPASMS.     Not Delegated - Cardiovascular:  Alpha-2 Agonists - tizanidine Failed - 12/31/2022  2:31 PM      Failed - This refill cannot be delegated      Passed - Valid encounter within last 6 months    Recent Outpatient Visits           3 weeks ago Sciatica of left side   [facility] Teodora Medici, DO   1 month ago Hypothyroidism, unspecified type   [facility] Teodora Medici, DO       Future Appointments             In 1 week Teodora Medici, [facility]             levothyroxine (SYNTHROID) 112 MCG tablet [Pharmacy Med Name: LEVOTHYROXINE 112 MCG TABLET] 90 tablet 1    Sig: TAKE 1 TABLET BY MOUTH EVERY DAY     Endocrinology:  Hypothyroid Agents Failed - 12/31/2022  2:31 PM      Failed - TSH in normal range and within 360 days    TSH  Date Value Ref Range Status  12/02/2022 0.28 (L) mIU/L Final    Comment:              Reference Range .           > or = 20 Years  0.40-4.50 .                Pregnancy Ranges           First trimester    0.26-2.66           Second trimester   0.55-2.73           Third trimester    0.43-2.91          Passed - Valid encounter within last 12 months    Recent Outpatient Visits           3 weeks ago Sciatica of left side   [facility] Teodora Medici, DO   1 month ago Hypothyroidism, unspecified type    [facility] Teodora Medici, DO       Future Appointments             In 1 week Teodora Medici, [facility], [facility]

## 2023-01-05 ENCOUNTER — Ambulatory Visit
Admission: RE | Admit: 2023-01-05 | Discharge: 2023-01-05 | Disposition: A | Discharge status: Home / Self Care | Payer: Medicaid Other | Source: Ambulatory Visit | Attending: Internal Medicine | Admitting: Internal Medicine

## 2023-01-05 DIAGNOSIS — R928 Other abnormal and inconclusive findings on diagnostic imaging of breast: Secondary | ICD-10-CM | POA: Diagnosis not present

## 2023-01-05 DIAGNOSIS — N63 Unspecified lump in unspecified breast: Secondary | ICD-10-CM | POA: Insufficient documentation

## 2023-01-05 DIAGNOSIS — N6001 Solitary cyst of right breast: Secondary | ICD-10-CM | POA: Diagnosis not present

## 2023-01-09 ENCOUNTER — Other Ambulatory Visit: Payer: Self-pay | Admitting: Internal Medicine

## 2023-01-09 DIAGNOSIS — M5432 Sciatica, left side: Secondary | ICD-10-CM

## 2023-01-10 NOTE — Telephone Encounter (Signed)
Unable to refill per protocol, Rx request is too soon. Last refill 01/02/23 for 30 days. Duplicate request.  Requested Prescriptions  Pending Prescriptions Disp Refills   tiZANidine (ZANAFLEX) 2 MG tablet [Pharmacy Med Name: TIZANIDINE HCL 2 MG TABLET] 30 tablet     Sig: TAKE 1 TABLET (2 MG TOTAL) BY MOUTH AT BEDTIME AS NEEDED FOR MUSCLE SPASMS.     Not Delegated - Cardiovascular:  Alpha-2 Agonists - tizanidine Failed - 01/09/2023  4:05 PM      Failed - This refill cannot be delegated      Passed - Valid encounter within last 6 months    Recent Outpatient Visits           1 month ago Sciatica of left side   [place], DO   1 month ago Hypothyroidism, unspecified type   [facility] Teodora Medici, DO       Future Appointments             In 2 days Teodora Medici, [facility], [facility]               .

## 2023-01-11 NOTE — Progress Notes (Unsigned)
Name: Dawn Kane   MRN: [id number]    DOB: 1971/05/22   Date:01/12/2023       Progress Note  Subjective  Chief Complaint  Chief Complaint  Patient presents with   Annual Exam    W/pap    HPI  Patient presents for annual CPE.  Diet: has well rounded breakfast, eats sandwich for lunch a lot of frozen foods Exercise: walks dog daily   Last Eye Exam: UTD Last Dental Exam: has a dentist, going soon for appointment    Depression: Phq 9 is  negative    01/12/2023   10:45 AM 12/09/2022   11:27 AM 12/02/2022   10:28 AM 08/01/2017   11:19 AM  Depression screen PHQ 2/9  Decreased Interest 0 0 0 0  Down, Depressed, Hopeless 0 0 0 0  PHQ - 2 Score 0 0 0 0  Altered sleeping 0 0 0   Tired, decreased energy 0 0 0   Change in appetite 0 0 0   Feeling bad or failure about yourself  0 0 0   Trouble concentrating 0 0 0   Moving slowly or fidgety/restless 0 0 0   Suicidal thoughts 0 0 0   PHQ-9 Score 0 0 0   Difficult doing work/chores Not difficult at all Not difficult at all Not difficult at all    Hypertension: BP Readings from Last 3 Encounters:  01/12/23 116/68  12/09/22 128/84  12/02/22 132/82   Obesity: Wt Readings from Last 3 Encounters:  01/12/23 164 lb 6.4 oz (74.6 kg)  12/09/22 168 lb 3.2 oz (76.3 kg)  12/02/22 166 lb 3.2 oz (75.4 kg)   BMI Readings from Last 3 Encounters:  01/12/23 29.59 kg/m  12/09/22 30.27 kg/m  12/02/22 29.91 kg/m     Vaccines:  Tdap: UTD Shingrix: Discussed Pneumonia: n/a Flu: [id number]: uncertain   Hep C Screening: positive - GI referral  STD testing and prevention (HIV/chl/gon/syphilis): would like to be screened LMP years ago  Discussed importance of follow up if any post-menopausal bleeding: yes  Incontinence Symptoms: negative for symptoms   Breast cancer:  - Last Mammogram: 01/05/23 - benign cyst, repeat in 1 year  Osteoporosis Prevention : Discussed high calcium and vitamin D supplementation, weight bearing  exercises Bone density :no   Cervical cancer screening: Due   Skin cancer: Discussed monitoring for atypical lesions  Colorectal cancer: Colonoscopy 06/2018, repeat in 10 years   Lung cancer:  Low Dose CT Chest recommended if Age 56-80 years, 20 pack-year currently smoking OR have quit w/in 15years. Patient does qualify for screen   ECG: 03/27/2019  Advanced Care Planning: A voluntary discussion about advance care planning including the explanation and discussion of advance directives.  Discussed health care proxy and Living will, and the patient was able to identify a health care proxy as son Tamera Stands.  Patient does not have a living will and power of attorney of health care   Lipids: Lab Results  Component Value Date   CHOL 291 (H) 12/02/2022   Lab Results  Component Value Date   HDL 43 (L) 12/02/2022   Lab Results  Component Value Date   LDLCALC 203 (H) 12/02/2022   Lab Results  Component Value Date   TRIG 245 (H) 12/02/2022   Lab Results  Component Value Date   CHOLHDL 6.8 (H) 12/02/2022   No results found for: "LDLDIRECT"  Glucose: Glucose, Bld  Date Value Ref Range Status  12/02/2022 83 65 - 99  mg/dL Final    Comment:    .            Fasting reference interval .   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   Adnexal mass 06/18/2019   Pyelonephritis of right kidney 03/03/2019   Chronic hepatitis C without hepatic coma ([place]) 08/01/2017   Sepsis ([place]) 07/09/2016   CAP (community acquired pneumonia) 07/09/2016   Anxiety 07/09/2016   Hypothyroidism 07/09/2016    Past Surgical History:  Procedure Laterality Date   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   CLAVICLE SURGERY     COLONOSCOPY WITH  PROPOFOL N/A 07/04/2018   Procedure: COLONOSCOPY WITH PROPOFOL;  Surgeon: Otis Brace, MD;  Location: [facility];  Service: Gastroenterology;  Laterality: N/A;   CYSTOSCOPY W/ URETERAL STENT PLACEMENT Right 03/03/2019   Procedure: CYSTOSCOPY WITH RETROGRADE PYELOGRAM/URETERAL STENT PLACEMENT;  Surgeon: Festus Aloe, MD;  Location: [facility];  Service: Urology;  Laterality: Right;   CYSTOSCOPY/URETEROSCOPY/HOLMIUM LASER/STENT PLACEMENT Right 03/29/2019   Procedure: CYSTOSCOPY/URETEROSCOPY/HOLMIUM LASER/STENT exchange;  Surgeon: Billey Co, MD;  Location: [facility];  Service: Urology;  Laterality: Right;   LUNG SURGERY     MANDIBLE SURGERY     POLYPECTOMY  07/04/2018   Procedure: POLYPECTOMY;  Surgeon: Otis Brace, MD;  Location: [facility];  Service: Gastroenterology;;    Family History  Problem Relation Age of Onset   Diabetes Brother    Alopecia Son    Heart disease Paternal Grandmother    Cancer Paternal Grandfather        pancreatic    Social History   Socioeconomic History   Marital status: Single    Spouse name: Not on file   Number of children: 2   Years of education: Not on file   Highest education level: Not on file  Occupational History   Not on file  Tobacco Use   Smoking status: Every Day    Packs/day: 1.5    Types: Cigarettes   Smokeless tobacco: Never  Vaping Use   Vaping Use: Never used  Substance and Sexual Activity   Alcohol use: No    Comment: quit in 2016   Drug use: Yes    Frequency: 3.0 times per week    Types: Marijuana   Sexual activity: Yes    Partners: Male    Birth control/protection: Surgical  Other Topics Concern   Not on file  Social History Narrative   Not on file   Social Determinants of Health   Financial Resource Strain: Medium Risk (01/12/2023)   Overall Financial Resource Strain (CARDIA)    Difficulty of Paying Living Expenses: Somewhat hard  Food Insecurity: No Food Insecurity (01/12/2023)   Hunger  Vital Sign    Worried About Running Out of Food in the Last Year: Never true    Ran Out of Food in the Last Year: Never true  Transportation Needs: No Transportation Needs (01/12/2023)   PRAPARE - Hydrologist (Medical): No    Lack of Transportation (Non-Medical): No  Physical Activity: Sufficiently Active (01/12/2023)   Exercise Vital Sign    Days of Exercise per Week: 6 days    Minutes of Exercise per Session: 30 min  Stress: No  Stress Concern Present (01/12/2023)   [place]    Feeling of Stress : Only a little  Social Connections: Moderately Integrated (01/12/2023)   Social Connection and Isolation Panel [NHANES]    Frequency of Communication with Friends and Family: Three times a week    Frequency of Social Gatherings with Friends and Family: Once a week    Attends Religious Services: 1 to 4 times per year    Active Member of [employer] or Organizations: Yes    Attends Music therapist: More than 4 times per year    Marital Status: Divorced  Intimate Partner Violence: Not At Risk (01/12/2023)   Humiliation, Afraid, Rape, and Kick questionnaire    Fear of Current or Ex-Partner: No    Emotionally Abused: No    Physically Abused: No    Sexually Abused: No     Current Outpatient Medications:    albuterol (VENTOLIN HFA) 108 (90 Base) MCG/ACT inhaler, Inhale 1-2 puffs into the lungs every 6 (six) hours as needed for wheezing or shortness of breath., Disp: 1 each, Rfl: 2   gabapentin (NEURONTIN) 300 MG capsule, Take 300 mg by mouth 3 (three) times daily., Disp: , Rfl:    ipratropium-albuterol (DUONEB) 0.5-2.5 (3) MG/3ML SOLN, Take 3 mLs by nebulization every 4 (four) hours as needed., Disp: 360 mL, Rfl: 2   levothyroxine (SYNTHROID) 112 MCG tablet, Take 1 tablet (112 mcg total) by mouth daily., Disp: 90 tablet, Rfl: 0   loratadine (CLARITIN) 10 MG tablet, Take 1 tablet (10 mg total) by  mouth daily., Disp: 30 tablet, Rfl: 11   Multiple Vitamins-Minerals (CENTRUM WOMEN) TABS, Take 1 tablet by mouth 3 (three) times a week., Disp: , Rfl:    omeprazole (PRILOSEC OTC) 20 MG tablet, Take 20 mg by mouth daily as needed (for acid reflux). , Disp: , Rfl:    tizanidine (ZANAFLEX) 2 MG capsule, Take 1 capsule (2 mg total) by mouth at bedtime as needed for muscle spasms., Disp: 30 capsule, Rfl: 0   hydrochlorothiazide (MICROZIDE) 12.5 MG capsule, Take 12.5 mg by mouth daily. (Patient not taking: Reported on 01/12/2023), Disp: , Rfl:    rosuvastatin (CRESTOR) 20 MG tablet, Take 20 mg by mouth daily. (Patient not taking: Reported on 01/12/2023), Disp: , Rfl:   Allergies  Allergen Reactions   Amoxicillin Nausea And Vomiting and Other (See Comments)    Has patient had a PCN reaction causing immediate rash, facial/tongue/throat swelling, SOB or lightheadedness with hypotension: No Has patient had a PCN reaction causing severe rash involving mucus membranes or skin necrosis: No Has patient had a PCN reaction that required hospitalization No Has patient had a PCN reaction occurring within the last 10 years: No If all of the above answers are "NO", then may proceed with Cephalosporin use.    Aspirin Nausea And Vomiting     Review of Systems  All other systems reviewed and are negative.   Objective  Vitals:   01/12/23 1034  BP: 116/68  Pulse: 91  Resp: 18  Temp: 98 F (36.7 C)  SpO2: 96%  Weight: 164 lb 6.4 oz (74.6 kg)  Height: 5' 2.5" (1.588 m)    Body mass index is 29.59 kg/m.  Physical Exam Exam conducted with a chaperone present.  Constitutional:      Appearance: Normal appearance.  HENT:     Head: Normocephalic and atraumatic.  Eyes:     Conjunctiva/sclera: Conjunctivae normal.  Cardiovascular:  Rate and Rhythm: Normal rate and regular rhythm.  Pulmonary:     Effort: Pulmonary effort is normal.     Breath sounds: Normal breath sounds.  Chest:  Breasts:     Right: Normal.     Left: Normal.  Genitourinary:    Comments: External genitalia within normal limits.  Vaginal mucosa pink, moist, normal rugae.  Nonfriable cervix without lesions, no discharge or bleeding noted on speculum exam.   Lymphadenopathy:     Upper Body:     Right upper body: No supraclavicular, axillary or pectoral adenopathy.     Left upper body: No supraclavicular, axillary or pectoral adenopathy.  Skin:    General: Skin is warm and dry.  Neurological:     General: No focal deficit present.     Mental Status: She is alert. Mental status is at baseline.  Psychiatric:        Mood and Affect: Mood normal.        Behavior: Behavior normal.     Recent Results (from the past 2160 hour(s))  CBC w/Diff/Platelet     Status: Abnormal   Collection Time: 12/02/22 11:15 AM  Result Value Ref Range   WBC 11.8 (H) 3.8 - 10.8 Thousand/uL   RBC 4.94 3.80 - 5.10 Million/uL   Hemoglobin 14.4 11.7 - 15.5 g/dL   HCT 42.3 35.0 - 45.0 %   MCV 85.6 80.0 - 100.0 fL   MCH 29.1 27.0 - 33.0 pg   MCHC 34.0 32.0 - 36.0 g/dL   RDW 13.7 11.0 - 15.0 %   Platelets 317 140 - 400 Thousand/uL   MPV 10.9 7.5 - 12.5 fL   Neutro Abs 7,316 1,500 - 7,800 cells/uL   Lymphs Abs 3,221 850 - 3,900 cells/uL   Absolute Monocytes 838 200 - 950 cells/uL   Eosinophils Absolute 330 15 - 500 cells/uL   Basophils Absolute 94 0 - 200 cells/uL   Neutrophils Relative % 62 %   Total Lymphocyte 27.3 %   Monocytes Relative 7.1 %   Eosinophils Relative 2.8 %   Basophils Relative 0.8 %  COMPLETE METABOLIC PANEL WITH GFR     Status: None   Collection Time: 12/02/22 11:15 AM  Result Value Ref Range   Glucose, Bld 83 65 - 99 mg/dL    Comment: .            Fasting reference interval .    BUN 16 7 - 25 mg/dL   Creat 0.90 0.50 - 1.03 mg/dL   eGFR 77 > OR = 60 mL/min/1.31m2   BUN/Creatinine Ratio SEE NOTE: 6 - 22 (calc)    Comment:    Not Reported: BUN and Creatinine are within    reference range. .    Sodium 140  135 - 146 mmol/L   Potassium 4.4 3.5 - 5.3 mmol/L   Chloride 104 98 - 110 mmol/L   CO2 26 20 - 32 mmol/L   Calcium 9.6 8.6 - 10.4 mg/dL   Total Protein 7.7 6.1 - 8.1 g/dL   Albumin 4.1 3.6 - 5.1 g/dL   Globulin 3.6 1.9 - 3.7 g/dL (calc)   AG Ratio 1.1 1.0 - 2.5 (calc)   Total Bilirubin 0.2 0.2 - 1.2 mg/dL   Alkaline phosphatase (APISO) 77 37 - 153 U/L   AST 12 10 - 35 U/L   ALT 18 6 - 29 U/L  Lipid Profile     Status: Abnormal   Collection Time: 12/02/22 11:15 AM  Result Value Ref  Range   Cholesterol 291 (H) <200 mg/dL   HDL 43 (L) > OR = 50 mg/dL   Triglycerides 245 (H) <150 mg/dL    Comment: . If a non-fasting specimen was collected, consider repeat triglyceride testing on a fasting specimen if clinically indicated.  Yates Decamp et al. J. of Clin. Lipidol. [id number]. Marland Kitchen    LDL Cholesterol (Calc) 203 (H) mg/dL (calc)    Comment: LDL-C levels > or = 190 mg/dL may indicate familial  hypercholesterolemia (FH). Clinical assessment and  measurement of blood lipid levels should be  considered for all first degree relatives of patients with an FH diagnosis. LDL Cholesterol (LDL-C) levels > or = 300 mg/dL may indicate homozygous familial hypercholesterolemia (HoFH). Untreated,  these extremely high LDL-C levels can result in premature CV events and mortality. Patients should be identified early and provided appropriate interventions to reduce the cumulative LDL-C burden from birth. . For questions about testing for familial hypercholesterolemia, please call Insurance risk surveyor at 1.866.GENE.INFO. Duncan Dull, et al. J National Lipid Association  Recommendations for Patient-Centered Management of Dyslipidemia: Part 1 Journal of  Clinical Lipidology 2015;9(2), 129-169. Cuchel, M. et al. (2014). Homozygous familial hypercholesterolaemia: new insights and guidance for clinicians to impro ve detection and clinical management. European Heart Journal,  35(32), [phone]. Reference range: <100 . Desirable range <100 mg/dL for primary prevention;   <70 mg/dL for patients with CHD or diabetic patients  with > or = 2 CHD risk factors. Marland Kitchen LDL-C is now calculated using the Martin-Hopkins  calculation, which is a validated novel method providing  better accuracy than the Friedewald equation in the  estimation of LDL-C.  Cresenciano Genre et al. Annamaria Helling. [id number]): 2061-2068  (http://education.Quest[facility].com/faq/FAQ164)    Total CHOL/HDL Ratio 6.8 (H) <5.0 (calc)   Non-HDL Cholesterol (Calc) 248 (H) <130 mg/dL (calc)    Comment: Non-HDL level > or = 220 is very high and may indicate  genetic familial hypercholesterolemia (FH). Clinical  assessment and measurement of blood lipid levels  should be considered for all first-degree relatives  of patients with an FH diagnosis. . For patients with diabetes plus 1 major ASCVD risk  factor, treating to a non-HDL-C goal of <100 mg/dL  (LDL-C of <70 mg/dL) is considered a therapeutic  option.   Hepatitis C Antibody     Status: Abnormal   Collection Time: 12/02/22 11:15 AM  Result Value Ref Range   Hepatitis C Ab REACTIVE (A) NON-REACTIVE    Comment: . Based on this result, the sample will be tested for HCV RNA by a Nucleic Acid Amplification Test (NAAT) to determine if the patient has a current active infection. .   Hepatitis C RNA quantitative     Status: Abnormal   Collection Time: 12/02/22 11:15 AM  Result Value Ref Range   HCV RNA, PCR, QN 10,000 (H) IU/mL   HCV Quantitative Log 4.00 (H) log IU/mL    Comment: . Reference Range:                          Not Detected       IU/mL                          Not Detected   Log IU/mL . This test was performed using Real-Time Polymerase Chain Reaction. . Reportable range is 15 IU/mL to 100,000,000 IU/mL (1.18 Log IU/mL to 8.00 Log IU/mL). . For additional information  please refer  to http://education.questdiagnostics.com/faq/FAQ22v1 (This link is being provided for informational/ educational purposes only.) . The analytical performance characteristics of this assay have been determined by [facility], [place], [place].  The modifications have not been cleared or approved by the FDA.  This assay has been validated pursuant to the CLIA regulations and is used for clinical purposes. .   TSH     Status: Abnormal   Collection Time: 12/02/22 11:15 AM  Result Value Ref Range   TSH 0.28 (L) mIU/L    Comment:           Reference Range .           > or = 20 Years  0.40-4.50 .                Pregnancy Ranges           First trimester    0.26-2.66           Second trimester   0.55-2.73           Third trimester    0.43-2.91   Thyroid Profile     Status: Abnormal   Collection Time: 12/02/22 11:15 AM  Result Value Ref Range   T3 Uptake 25 22 - 35 %   T4, Total 13.2 (H) 5.1 - 11.9 mcg/dL   Free Thyroxine Index 3.3 1.4 - 3.8  HCV RNA, Quantitative Real Time PCR     Status: Abnormal   Collection Time: 12/02/22 11:15 AM  Result Value Ref Range   HCV RNA, PCR, QN 2,570 (H) NOT DETECTED IU/mL   HCV Quantitative Log 3.41 (H) NOT DETECTED Log IU/mL    Comment: . HCV RNA was detected. This result provides laboratory  evidence of a current active HCV infection. . . This test was performed using Real-Time Polymerase Chain Reaction. . Reportable Range: 15 IU/mL to 100,000,000 IU/mL (1.18 Log IU/mL to 8.00 Log IU/mL). . The analytical performance characteristics of this assay have been determined by [facility].  The modifications have not been cleared or approved by the FDA. This assay has been validated pursuant to the  CLIA regulations and is used for clinical purposes.   . For more information on this test, go to: http://education.questdiagnostics.com/faq/FAQ22v1 (This link is being provided for informational/ educational purposes  only.) . This assay is intended for use as an aid in the diagnosis of HCV infection and the management of HCV infected patients undergoing anti-viral therapy. .   TEST AUTHORIZATION     Status: None   Collection Time: 12/02/22 11:15 AM  Result Value Ref Range   TEST NAME: THYROID PANEL    TEST CODE: [id number]    CLIENT CONTACT: Hollyanne Schloesser    REPORT ALWAYS MESSAGE SIGNATURE      Comment: . The laboratory testing on this patient was verbally requested or confirmed by the ordering physician or his or her authorized representative after contact with an employee of [employer]. Federal regulations require that we maintain on file written authorization for all laboratory testing.  Accordingly we are asking that the ordering physician or his or her authorized representative sign a copy of this report and promptly return it to the client service representative. . . Signature:____________________________________________________ . Please fax this signed page to [phone] or return it via your [employer] courier.      Fall Risk:    01/12/2023   10:34 AM 12/09/2022   11:24 AM 12/02/2022   10:28 AM 08/01/2017  11:19 AM  Fall Risk   Falls in the past year? 0 0 0 No  Number falls in past yr: 0 0 0   Injury with Fall? 0 0 0      Functional Status Survey: Is the patient deaf or have difficulty hearing?: No Does the patient have difficulty seeing, even when wearing glasses/contacts?: No Does the patient have difficulty concentrating, remembering, or making decisions?: No Does the patient have difficulty walking or climbing stairs?: No Does the patient have difficulty dressing or bathing?: No Does the patient have difficulty doing errands alone such as visiting a doctor's office or shopping?: No   Assessment & Plan  1. Annual physical exam/Cervical cancer screening/Screening examination for STD (sexually transmitted disease): Labs and Pap today.   -  Cytology - PAP - HIV antibody (with reflex) - RPR - Cervicovaginal ancillary only  2. Screening for lung cancer: Lung cancer screening ordered.   - Ambulatory Referral Lung Cancer Screening [facility]   -USPSTF grade A and B recommendations reviewed with patient; age-appropriate recommendations, preventive care, screening tests, etc discussed and encouraged; healthy living encouraged; see AVS for patient education given to patient -Discussed importance of 150 minutes of physical activity weekly, eat two servings of fish weekly, eat one serving of tree nuts ( cashews, pistachios, pecans, almonds.Marland Kitchen) every other day, eat 6 servings of fruit/vegetables daily and drink plenty of water and avoid sweet beverages.   -Reviewed Health Maintenance: Yes.

## 2023-01-12 ENCOUNTER — Other Ambulatory Visit: Payer: Self-pay | Admitting: Internal Medicine

## 2023-01-12 ENCOUNTER — Other Ambulatory Visit (HOSPITAL_COMMUNITY)
Admission: RE | Admit: 2023-01-12 | Discharge: 2023-01-12 | Disposition: A | Discharge status: Home / Self Care | Payer: Medicaid Other | Source: Ambulatory Visit | Attending: Internal Medicine | Admitting: Internal Medicine

## 2023-01-12 ENCOUNTER — Encounter: Payer: Self-pay | Admitting: Internal Medicine

## 2023-01-12 ENCOUNTER — Ambulatory Visit: Payer: Medicaid Other | Admitting: Internal Medicine

## 2023-01-12 VITALS — BP 116/68 | HR 91 | Temp 98.0°F | Resp 18 | Ht 62.5 in | Wt 164.4 lb

## 2023-01-12 DIAGNOSIS — Z113 Encounter for screening for infections with a predominantly sexual mode of transmission: Secondary | ICD-10-CM | POA: Diagnosis not present

## 2023-01-12 DIAGNOSIS — Z122 Encounter for screening for malignant neoplasm of respiratory organs: Secondary | ICD-10-CM | POA: Diagnosis not present

## 2023-01-12 DIAGNOSIS — Z0000 Encounter for general adult medical examination without abnormal findings: Secondary | ICD-10-CM | POA: Insufficient documentation

## 2023-01-12 DIAGNOSIS — M5432 Sciatica, left side: Secondary | ICD-10-CM

## 2023-01-12 DIAGNOSIS — Z124 Encounter for screening for malignant neoplasm of cervix: Secondary | ICD-10-CM | POA: Diagnosis not present

## 2023-01-12 NOTE — Addendum Note (Signed)
Addended by: Docia Furl on: 01/12/2023 01:23 PM   Modules accepted: Orders

## 2023-01-12 NOTE — Telephone Encounter (Signed)
Pt said that she forgot to mention that she would like to get a refill on her Muscle relaxers. Pharm is [facility].

## 2023-01-13 ENCOUNTER — Other Ambulatory Visit: Payer: Self-pay | Admitting: Internal Medicine

## 2023-01-13 DIAGNOSIS — M5432 Sciatica, left side: Secondary | ICD-10-CM

## 2023-01-13 DIAGNOSIS — G8929 Other chronic pain: Secondary | ICD-10-CM

## 2023-01-13 LAB — CERVICOVAGINAL ANCILLARY ONLY
Bacterial Vaginitis (gardnerella): NEGATIVE
Chlamydia: NEGATIVE
Comment: NEGATIVE
Comment: NEGATIVE
Comment: NEGATIVE
Comment: NORMAL
Neisseria Gonorrhea: NEGATIVE
Trichomonas: NEGATIVE

## 2023-01-13 LAB — CYTOLOGY - PAP
Adequacy: ABSENT
Chlamydia: NEGATIVE
Comment: NEGATIVE
Comment: NEGATIVE
Comment: NEGATIVE
Comment: NORMAL
Diagnosis: NEGATIVE
High risk HPV: NEGATIVE
Neisseria Gonorrhea: NEGATIVE
Trichomonas: NEGATIVE

## 2023-01-13 LAB — HIV ANTIBODY (ROUTINE TESTING W REFLEX): HIV 1&2 Ab, 4th Generation: NONREACTIVE

## 2023-01-13 LAB — RPR: RPR Ser Ql: NONREACTIVE

## 2023-01-13 MED ORDER — TIZANIDINE HCL 4 MG PO TABS: 4.0000 mg | ORAL_TABLET | Freq: Four times a day (QID) | ORAL | 0 refills | Status: DC | PRN

## 2023-01-16 DIAGNOSIS — Z419 Encounter for procedure for purposes other than remedying health state, unspecified: Secondary | ICD-10-CM | POA: Diagnosis not present

## 2023-02-08 NOTE — Progress Notes (Deleted)
   Acute Office Visit  Subjective:     Patient ID: Dawn Kane, female    DOB: 08/29/1971, 52 y.o.   MRN: [id number]  No chief complaint on file.   HPI Patient is in today for skin tag.   ROS      Objective:    LMP 06/17/2016 Comment: neg preg test {Vitals History (Optional):23777}  Physical Exam  No results found for any visits on 02/09/23.      Assessment & Plan:   Problem List Items Addressed This Visit   None   No orders of the defined types were placed in this encounter.   No follow-ups on file.  [name], DO

## 2023-02-09 ENCOUNTER — Ambulatory Visit: Payer: Medicaid Other | Admitting: Internal Medicine

## 2023-02-19 NOTE — Progress Notes (Unsigned)
   Acute Office Visit  Subjective:     Patient ID: Dawn Kane, female    DOB: 13-Feb-1971, 52 y.o.   MRN: [id number]  No chief complaint on file.   HPI Patient is in today for skin tag.   ROS      Objective:    LMP 06/17/2016 Comment: neg preg test {Vitals History (Optional):23777}  Physical Exam  No results found for any visits on 02/20/23.      Assessment & Plan:   Problem List Items Addressed This Visit   None   No orders of the defined types were placed in this encounter.   No follow-ups on file.  [name], DO

## 2023-02-20 ENCOUNTER — Ambulatory Visit: Payer: Medicaid Other | Admitting: Internal Medicine

## 2023-02-20 ENCOUNTER — Encounter: Payer: Self-pay | Admitting: Internal Medicine

## 2023-02-20 VITALS — BP 124/68 | HR 78 | Temp 98.0°F | Resp 18 | Ht 62.5 in | Wt 169.2 lb

## 2023-02-20 DIAGNOSIS — K6289 Other specified diseases of anus and rectum: Secondary | ICD-10-CM

## 2023-02-23 ENCOUNTER — Other Ambulatory Visit: Payer: Self-pay | Admitting: Internal Medicine

## 2023-02-23 DIAGNOSIS — G8929 Other chronic pain: Secondary | ICD-10-CM

## 2023-02-23 NOTE — Telephone Encounter (Signed)
Requested Prescriptions  Refused Prescriptions Disp Refills   meloxicam (MOBIC) 7.5 MG tablet [Pharmacy Med Name: MELOXICAM 7.5 MG TABLET] 90 tablet 0    Sig: TAKE 1 TABLET (7.5 MG TOTAL) BY MOUTH AS NEEDED.     Analgesics:  COX2 Inhibitors Failed - 02/23/2023 11:36 AM      Failed - Manual Review: Labs are only required if the patient has taken medication for more than 8 weeks.      Passed - HGB in normal range and within 360 days    Hemoglobin  Date Value Ref Range Status  12/02/2022 14.4 11.7 - 15.5 g/dL Final         Passed - Cr in normal range and within 360 days    Creat  Date Value Ref Range Status  12/02/2022 0.90 0.50 - 1.03 mg/dL Final         Passed - HCT in normal range and within 360 days    HCT  Date Value Ref Range Status  12/02/2022 42.3 35.0 - 45.0 % Final         Passed - AST in normal range and within 360 days    AST  Date Value Ref Range Status  12/02/2022 12 10 - 35 U/L Final         Passed - ALT in normal range and within 360 days    ALT  Date Value Ref Range Status  12/02/2022 18 6 - 29 U/L Final  03/20/2018 7 6 - 29 U/L Final         Passed - eGFR is 30 or above and within 360 days    GFR, Est African American  Date Value Ref Range Status  03/20/2018 56 (L) > OR = 60 mL/min/1.46m2 Final   GFR calc Af Amer  Date Value Ref Range Status  03/05/2019 43 (L) >60 mL/min Final   GFR, Est Non African American  Date Value Ref Range Status  03/20/2018 48 (L) > OR = 60 mL/min/1.48m2 Final   GFR calc non Af Amer  Date Value Ref Range Status  03/05/2019 37 (L) >60 mL/min Final   eGFR  Date Value Ref Range Status  12/02/2022 77 > OR = 60 mL/min/1.62m2 Final         Passed - Patient is not pregnant      Passed - Valid encounter within last 12 months    Recent Outpatient Visits           3 days ago Rectal mass   [facility] [name], DO   1 month ago Annual physical exam   [facility] [name], DO   2 months ago Sciatica of left side   [facility] [name], DO   2 months ago Hypothyroidism, unspecified type   [facility] [name], DO       Future Appointments             In 1 week Allena Katz Gillie Manners, DPM [facility] at [place], [place]   In 4 months [name], DO [facility], [facility]

## 2023-02-27 ENCOUNTER — Encounter: Payer: Self-pay | Admitting: Surgery

## 2023-02-27 ENCOUNTER — Ambulatory Visit (INDEPENDENT_AMBULATORY_CARE_PROVIDER_SITE_OTHER): Payer: Medicare Other | Admitting: Surgery

## 2023-02-27 VITALS — BP 138/90 | HR 68 | Temp 98.1°F | Ht 62.0 in | Wt 169.4 lb

## 2023-02-27 DIAGNOSIS — K644 Residual hemorrhoidal skin tags: Secondary | ICD-10-CM
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
## 2023-02-27 NOTE — Patient Instructions (Signed)
Our surgery scheduler Britta Mccreedy will call you within 24-48 hours to get you scheduled. If you have not heard from her after 48 hours, please call our office. Have the blue sheet available when she calls to write down important information.  If you have any concerns or questions, please feel free to call our office.    Skin Tag, Adult  A skin tag (acrochordon) is a soft, extra growth of skin. Most skin tags are skin-colored and rarely bigger than a pencil eraser. They often form in areas where there is frequent rubbing, or friction, on the skin. This may be where there are folds in the skin, such as: The eyelids. The neck. The armpits. The groin. Skin tags are not dangerous, and they do not spread from person to person (are not contagious). You may have one skin tag or many. Skin tags do not need treatment. However, your health care provider may recommend removing a skin tag if it: Gets irritated from clothing or jewelry. Bleeds. Is visible and unsightly. What are the causes? This condition is linked to: Increasing age. Pregnancy. Diabetes. Obesity. What are the signs or symptoms? Skin tags usually do not cause symptoms unless they get irritated by items touching your skin, such as clothing or jewelry. When this happens, you may have pain, itching, or bleeding. How is this diagnosed? This condition is diagnosed with an evaluation from your health care provider. No testing is needed for diagnosis. How is this treated? Treatment for this condition depends on whether you have symptoms. Your health care provider may also remove your skin tag if it is visible or if you do not like the way it looks. A skin tag can be removed by a health care provider with: A simple surgical procedure using scissors. A procedure that involves freezing your skin tag with a gas in liquid form (liquid nitrogen). A procedure that uses heat to destroy your skin tag (electrodessication). Follow these instructions at  home: Watch for any changes in your skin tag. A normal skin tag does not require any other special care at home. Take over-the-counter and prescription medicines only as told by your health care provider. Keep all follow-up visits. Contact a health care provider if: You have a skin tag that: Becomes painful. Changes color. Bleeds. Swells. Summary Skin tags are soft, extra growths of skin found in areas of frequent rubbing or friction. Skin tags usually do not cause symptoms. If symptoms occur, you may have pain, itching, or bleeding. Your health care provider may remove your skin tag if it causes symptoms or if you do not like the way it looks. This information is not intended to replace advice given to you by your health care provider. Make sure you discuss any questions you have with your health care provider. Document Revised: 11/17/2021 Document Reviewed: 11/17/2021 Elsevier Patient Education  2023 [employer].
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
## 2023-02-28 ENCOUNTER — Telehealth: Payer: Self-pay | Admitting: Surgery

## 2023-02-28 NOTE — Telephone Encounter (Signed)
Patient has been advised of Pre-Admission date/time, and Surgery date at [facility].  Surgery Date: 03/07/23 Preadmission Testing Date: 03/02/23 (phone 8a-1p)  Patient has been made aware to call [phone], between 1-3:00pm the day before surgery, to find out what time to arrive for surgery.    

## 2023-03-02 ENCOUNTER — Encounter
Admission: RE | Admit: 2023-03-02 | Discharge: 2023-03-02 | Disposition: A | Discharge status: Home / Self Care | Payer: Medicare Other | Source: Ambulatory Visit | Attending: Surgery | Admitting: Surgery

## 2023-03-02 ENCOUNTER — Ambulatory Visit: Payer: Medicaid Other | Admitting: Podiatry

## 2023-03-02 DIAGNOSIS — Z01812 Encounter for preprocedural laboratory examination: Secondary | ICD-10-CM

## 2023-03-02 DIAGNOSIS — I10 Essential (primary) hypertension: Secondary | ICD-10-CM

## 2023-03-02 DIAGNOSIS — Z01810 Encounter for preprocedural cardiovascular examination: Secondary | ICD-10-CM

## 2023-03-02 HISTORY — DX: Cannabis use, unspecified, uncomplicated: F12.90

## 2023-03-02 HISTORY — DX: Anemia, unspecified: D64.9

## 2023-03-02 HISTORY — DX: Other chronic pain: G89.29

## 2023-03-02 HISTORY — DX: Headache, unspecified: R51.9

## 2023-03-02 HISTORY — DX: Other specified conditions associated with female genital organs and menstrual cycle: N94.89

## 2023-03-02 HISTORY — DX: Tobacco use: Z72.0

## 2023-03-02 HISTORY — DX: Sepsis, unspecified organism: A41.9

## 2023-03-02 HISTORY — DX: Unspecified viral hepatitis C without hepatic coma: B19.20

## 2023-03-02 HISTORY — DX: Personal history of urinary calculi: Z87.442

## 2023-03-02 NOTE — Patient Instructions (Signed)
Your procedure is scheduled on:03-07-23 Tuesday Report to the Registration Desk on the 1st floor of the Medical Mall.Then proceed to the 2nd floor Surgery Desk To find out your arrival time, please call [phone] between 1PM - 3PM on:03-06-23 Monday If your arrival time is 6:00 am, do not arrive before that time as the Medical Mall entrance doors do not open until 6:00 am.  REMEMBER: Instructions that are not followed completely may result in serious medical risk, up to and including death; or upon the discretion of your surgeon and anesthesiologist your surgery may need to be rescheduled.  Do not eat food after midnight the night before surgery.  No gum chewing or hard candies.  You may however, drink CLEAR liquids up to 2 hours before you are scheduled to arrive for your surgery. Do not drink anything within 2 hours of your scheduled arrival time.  Clear liquids include: - water  - apple juice without pulp - gatorade (not RED colors) - black coffee or tea (Do NOT add milk or creamers to the coffee or tea) Do NOT drink anything that is not on this list.  One week prior to surgery: Stop Anti-inflammatories (NSAIDS) such as meloxicam (MOBIC), Advil, Aleve, Ibuprofen, Motrin, Naproxen, Naprosyn and Aspirin based products such as Excedrin, Goody's Powder, BC Powder.You may however, take Tylenol if needed for pain up until the day of surgery. Stop ANY OVER THE COUNTER supplements/vitamins NOW (03-02-23) until after surgery (Multivitamin   TAKE ONLY THESE MEDICATIONS THE MORNING OF SURGERY WITH A SIP OF WATER: -levothyroxine (SYNTHROID)  -omeprazole (PRILOSEC OTC)-take one the night before surgery and one the morning of surgery  Use your albuterol (VENTOLIN HFA) Inhaler the day of surgery and bring your Inhaler to the hospital  No Alcohol for 24 hours before or after surgery.  No Smoking including e-cigarettes for 24 hours before surgery.  No chewable tobacco products for at least 6  hours before surgery.  No nicotine patches on the day of surgery.  Do not use any "recreational" drugs for at least a week (preferably 2 weeks) before your surgery.  Please be advised that the combination of cocaine and anesthesia may have negative outcomes, up to and including death. If you test positive for cocaine, your surgery will be cancelled.  On the morning of surgery brush your teeth with toothpaste and water, you may rinse your mouth with mouthwash if you wish. Do not swallow any toothpaste or mouthwash.  Use CHG Soap as directed on instruction sheet.  Do not wear jewelry, make-up, hairpins, clips or nail polish.  Do not wear lotions, powders, or perfumes.   Do not shave body hair from the neck down 48 hours before surgery.  Contact lenses, hearing aids and dentures may not be worn into surgery.  Do not bring valuables to the hospital. [facility] is not responsible for any missing/lost belongings or valuables. .   Notify your doctor if there is any change in your medical condition (cold, fever, infection).  Wear comfortable clothing (specific to your surgery type) to the hospital.  After surgery, you can help prevent lung complications by doing breathing exercises.  Take deep breaths and cough every 1-2 hours. Your doctor may order a device called an Incentive Spirometer to help you take deep breaths. When coughing or sneezing, hold a pillow firmly against your incision with both hands. This is called "splinting." Doing this helps protect your incision. It also decreases belly discomfort.  If you are being admitted  to the hospital overnight, leave your suitcase in the car. After surgery it may be brought to your room.  In case of increased patient census, it may be necessary for you, the patient, to continue your postoperative care in the Same Day Surgery department.  If you are being discharged the day of surgery, you will not be allowed to drive home. You will need a  responsible individual to drive you home and stay with you for 24 hours after surgery.   If you are taking public transportation, you will need to have a responsible individual with you.  Please call the Pre-admissions Testing Dept. at [phone] if you have any questions about these instructions.  Surgery Visitation Policy:  Patients having surgery or a procedure may have two visitors.  Children under the age of 65 must have an adult with them who is not the patient.     Preparing for Surgery with CHLORHEXIDINE GLUCONATE (CHG) Soap  Chlorhexidine Gluconate (CHG) Soap  o An antiseptic cleaner that kills germs and bonds with the skin to continue killing germs even after washing  o Used for showering the night before surgery and morning of surgery  Before surgery, you can play an important role by reducing the number of germs on your skin.  CHG (Chlorhexidine gluconate) soap is an antiseptic cleanser which kills germs and bonds with the skin to continue killing germs even after washing.  Please do not use if you have an allergy to CHG or antibacterial soaps. If your skin becomes reddened/irritated stop using the CHG.  1. Shower the NIGHT BEFORE SURGERY and the MORNING OF SURGERY with CHG soap.  2. If you choose to wash your hair, wash your hair first as usual with your normal shampoo.  3. After shampooing, rinse your hair and body thoroughly to remove the shampoo.  4. Use CHG as you would any other liquid soap. You can apply CHG directly to the skin and wash gently with a scrungie or a clean washcloth.  5. Apply the CHG soap to your body only from the neck down. Do not use on open wounds or open sores. Avoid contact with your eyes, ears, mouth, and genitals (private parts). Wash face and genitals (private parts) with your normal soap.  6. Wash thoroughly, paying special attention to the area where your surgery will be performed.  7. Thoroughly rinse your body with warm  water.  8. Do not shower/wash with your normal soap after using and rinsing off the CHG soap.  9. Pat yourself dry with a clean towel.  10. Wear clean pajamas to bed the night before surgery.  12. Place clean sheets on your bed the night of your first shower and do not sleep with pets.  13. Shower again with the CHG soap on the day of surgery prior to arriving at the hospital.  14. Do not apply any deodorants/lotions/powders.  15. Please wear clean clothes to the hospital.

## 2023-03-06 ENCOUNTER — Encounter
Admission: RE | Admit: 2023-03-06 | Discharge: 2023-03-06 | Disposition: A | Discharge status: Home / Self Care | Payer: Medicare Other | Source: Ambulatory Visit | Attending: Surgery | Admitting: Surgery

## 2023-03-06 DIAGNOSIS — I10 Essential (primary) hypertension: Secondary | ICD-10-CM

## 2023-03-06 DIAGNOSIS — Z01812 Encounter for preprocedural laboratory examination: Secondary | ICD-10-CM

## 2023-03-06 DIAGNOSIS — Z01810 Encounter for preprocedural cardiovascular examination: Secondary | ICD-10-CM

## 2023-03-06 DIAGNOSIS — Z01818 Encounter for other preprocedural examination: Secondary | ICD-10-CM | POA: Insufficient documentation

## 2023-03-06 LAB — CBC
HCT: 45.8 % (ref 36.0–46.0)
Hemoglobin: 14.7 g/dL (ref 12.0–15.0)
MCH: 28.7 pg (ref 26.0–34.0)
MCHC: 32.1 g/dL (ref 30.0–36.0)
MCV: 89.3 fL (ref 80.0–100.0)
Platelets: 327 10*3/uL (ref 150–400)
RBC: 5.13 MIL/uL — ABNORMAL HIGH (ref 3.87–5.11)
RDW: 14.3 % (ref 11.5–15.5)
WBC: 13.6 10*3/uL — ABNORMAL HIGH (ref 4.0–10.5)
nRBC: 0 % (ref 0.0–0.2)

## 2023-03-06 LAB — BASIC METABOLIC PANEL
Anion gap: 7 (ref 5–15)
BUN: 18 mg/dL (ref 6–20)
CO2: 25 mmol/L (ref 22–32)
Calcium: 9.3 mg/dL (ref 8.9–10.3)
Chloride: 104 mmol/L (ref 98–111)
Creatinine, Ser: 1.01 mg/dL — ABNORMAL HIGH (ref 0.44–1.00)
GFR, Estimated: >60 mL/min (ref >60)
Glucose, Bld: 112 mg/dL — ABNORMAL HIGH (ref 70–99)
Potassium: 3.4 mmol/L — ABNORMAL LOW (ref 3.5–5.1)
Sodium: 136 mmol/L (ref 135–145)

## 2023-03-06 MED ORDER — CHLORHEXIDINE GLUCONATE 0.12 % MT SOLN
15.0000 mL | Freq: Once | OROMUCOSAL | Status: AC
  Administered 2023-03-07: 15 mL via OROMUCOSAL

## 2023-03-06 MED ORDER — CHLORHEXIDINE GLUCONATE CLOTH 2 % EX PADS: 6.0000 | MEDICATED_PAD | Freq: Once | CUTANEOUS | Status: DC

## 2023-03-06 MED ORDER — ORAL CARE MOUTH RINSE: 15.0000 mL | Freq: Once | OROMUCOSAL | Status: AC

## 2023-03-06 MED ORDER — ACETAMINOPHEN 500 MG PO TABS
1000.0000 mg | ORAL_TABLET | ORAL | Status: AC
  Administered 2023-03-07: 1000 mg via ORAL

## 2023-03-06 MED ORDER — SODIUM CHLORIDE 0.9 % IV SOLN
2.0000 g | INTRAVENOUS | Status: AC
  Administered 2023-03-07: 2 g via INTRAVENOUS

## 2023-03-06 MED ORDER — GABAPENTIN 300 MG PO CAPS
300.0000 mg | ORAL_CAPSULE | ORAL | Status: AC
  Administered 2023-03-07: 300 mg via ORAL

## 2023-03-06 MED ORDER — LACTATED RINGERS IV SOLN: INTRAVENOUS | Status: DC

## 2023-03-07 ENCOUNTER — Ambulatory Visit
Admission: RE | Admit: 2023-03-07 | Discharge: 2023-03-07 | Disposition: A | Discharge status: Home / Self Care | Payer: Medicare Other | Attending: Surgery | Admitting: Surgery

## 2023-03-07 ENCOUNTER — Other Ambulatory Visit: Payer: Self-pay

## 2023-03-07 ENCOUNTER — Encounter
Admission: RE | Disposition: A | Discharge status: Home / Self Care | Payer: Self-pay | Source: Home / Self Care | Attending: Surgery

## 2023-03-07 ENCOUNTER — Ambulatory Visit: Payer: Medicare Other | Admitting: Urgent Care

## 2023-03-07 ENCOUNTER — Encounter: Payer: Self-pay | Admitting: Surgery

## 2023-03-07 ENCOUNTER — Other Ambulatory Visit: Payer: Self-pay | Admitting: Internal Medicine

## 2023-03-07 ENCOUNTER — Ambulatory Visit: Payer: Medicare Other | Admitting: General Practice

## 2023-03-07 DIAGNOSIS — I10 Essential (primary) hypertension: Secondary | ICD-10-CM | POA: Insufficient documentation

## 2023-03-07 DIAGNOSIS — K648 Other hemorrhoids: Secondary | ICD-10-CM | POA: Diagnosis not present

## 2023-03-07 DIAGNOSIS — Z6831 Body mass index (BMI) 31.0-31.9, adult: Secondary | ICD-10-CM | POA: Insufficient documentation

## 2023-03-07 DIAGNOSIS — M5432 Sciatica, left side: Secondary | ICD-10-CM

## 2023-03-07 DIAGNOSIS — Z79899 Other long term (current) drug therapy: Secondary | ICD-10-CM | POA: Insufficient documentation

## 2023-03-07 DIAGNOSIS — G8929 Other chronic pain: Secondary | ICD-10-CM

## 2023-03-07 DIAGNOSIS — K6289 Other specified diseases of anus and rectum: Secondary | ICD-10-CM | POA: Diagnosis present

## 2023-03-07 DIAGNOSIS — F17210 Nicotine dependence, cigarettes, uncomplicated: Secondary | ICD-10-CM | POA: Insufficient documentation

## 2023-03-07 DIAGNOSIS — J449 Chronic obstructive pulmonary disease, unspecified: Secondary | ICD-10-CM | POA: Diagnosis not present

## 2023-03-07 DIAGNOSIS — K219 Gastro-esophageal reflux disease without esophagitis: Secondary | ICD-10-CM | POA: Diagnosis not present

## 2023-03-07 DIAGNOSIS — E669 Obesity, unspecified: Secondary | ICD-10-CM | POA: Diagnosis not present

## 2023-03-07 DIAGNOSIS — E039 Hypothyroidism, unspecified: Secondary | ICD-10-CM | POA: Diagnosis not present

## 2023-03-07 DIAGNOSIS — E785 Hyperlipidemia, unspecified: Secondary | ICD-10-CM | POA: Insufficient documentation

## 2023-03-07 DIAGNOSIS — K644 Residual hemorrhoidal skin tags: Secondary | ICD-10-CM | POA: Insufficient documentation

## 2023-03-07 HISTORY — PX: MASS EXCISION: SHX2000

## 2023-03-07 SURGERY — EXCISION MASS
Anesthesia: General

## 2023-03-07 MED ORDER — LIDOCAINE HCL (CARDIAC) PF 100 MG/5ML IV SOSY
PREFILLED_SYRINGE | INTRAVENOUS | Status: DC | PRN
  Administered 2023-03-07: 100 mg via INTRATRACHEAL

## 2023-03-07 MED ORDER — BUPIVACAINE LIPOSOME 1.3 % IJ SUSP
INTRAMUSCULAR | Status: DC | PRN
  Administered 2023-03-07: 20 mL

## 2023-03-07 MED ORDER — EPINEPHRINE PF 1 MG/ML IJ SOLN
INTRAMUSCULAR | Status: AC
  Filled 2023-03-07: qty 1

## 2023-03-07 MED ORDER — FENTANYL CITRATE (PF) 100 MCG/2ML IJ SOLN: 25.0000 ug | INTRAMUSCULAR | Status: DC | PRN

## 2023-03-07 MED ORDER — ONDANSETRON HCL 4 MG/2ML IJ SOLN
INTRAMUSCULAR | Status: DC | PRN
  Administered 2023-03-07: 4 mg via INTRAVENOUS

## 2023-03-07 MED ORDER — CHLORHEXIDINE GLUCONATE 0.12 % MT SOLN
OROMUCOSAL | Status: AC
  Filled 2023-03-07: qty 15

## 2023-03-07 MED ORDER — ACETAMINOPHEN 500 MG PO TABS
ORAL_TABLET | ORAL | Status: AC
  Filled 2023-03-07: qty 2

## 2023-03-07 MED ORDER — PROPOFOL 500 MG/50ML IV EMUL
INTRAVENOUS | Status: DC | PRN
  Administered 2023-03-07: 165 ug/kg/min via INTRAVENOUS

## 2023-03-07 MED ORDER — BUPIVACAINE HCL (PF) 0.25 % IJ SOLN
INTRAMUSCULAR | Status: AC
  Filled 2023-03-07: qty 30

## 2023-03-07 MED ORDER — PROPOFOL 10 MG/ML IV BOLUS
INTRAVENOUS | Status: DC | PRN
  Administered 2023-03-07: 60 mg via INTRAVENOUS

## 2023-03-07 MED ORDER — OXYCODONE HCL 5 MG PO TABS: 5.0000 mg | ORAL_TABLET | Freq: Once | ORAL | Status: DC | PRN

## 2023-03-07 MED ORDER — HYDROCODONE-ACETAMINOPHEN 5-325 MG PO TABS
1.0000 | ORAL_TABLET | Freq: Four times a day (QID) | ORAL | 0 refills | Status: DC | PRN
Start: 2023-03-07 — End: 2023-05-11

## 2023-03-07 MED ORDER — MIDAZOLAM HCL 2 MG/2ML IJ SOLN
INTRAMUSCULAR | Status: AC
  Filled 2023-03-07: qty 2

## 2023-03-07 MED ORDER — EPHEDRINE SULFATE (PRESSORS) 50 MG/ML IJ SOLN
INTRAMUSCULAR | Status: DC | PRN
  Administered 2023-03-07: 10 mg via INTRAVENOUS

## 2023-03-07 MED ORDER — PROPOFOL 1000 MG/100ML IV EMUL
INTRAVENOUS | Status: AC
  Filled 2023-03-07: qty 100

## 2023-03-07 MED ORDER — OXYCODONE HCL 5 MG/5ML PO SOLN: 5.0000 mg | Freq: Once | ORAL | Status: DC | PRN

## 2023-03-07 MED ORDER — DEXMEDETOMIDINE HCL IN NACL 200 MCG/50ML IV SOLN
INTRAVENOUS | Status: DC | PRN
  Administered 2023-03-07: 8 ug via INTRAVENOUS
  Administered 2023-03-07: 4 ug via INTRAVENOUS
  Administered 2023-03-07: 8 ug via INTRAVENOUS

## 2023-03-07 MED ORDER — SODIUM CHLORIDE 0.9 % IV SOLN
INTRAVENOUS | Status: AC
  Filled 2023-03-07: qty 2

## 2023-03-07 MED ORDER — DEXAMETHASONE SODIUM PHOSPHATE 10 MG/ML IJ SOLN
INTRAMUSCULAR | Status: DC | PRN
  Administered 2023-03-07: 10 mg via INTRAVENOUS

## 2023-03-07 MED ORDER — FENTANYL CITRATE (PF) 100 MCG/2ML IJ SOLN
INTRAMUSCULAR | Status: DC | PRN
  Administered 2023-03-07 (×3): 25 ug via INTRAVENOUS

## 2023-03-07 MED ORDER — MIDAZOLAM HCL 2 MG/2ML IJ SOLN
INTRAMUSCULAR | Status: DC | PRN
  Administered 2023-03-07: 2 mg via INTRAVENOUS

## 2023-03-07 MED ORDER — FENTANYL CITRATE (PF) 100 MCG/2ML IJ SOLN
INTRAMUSCULAR | Status: AC
  Filled 2023-03-07: qty 2

## 2023-03-07 MED ORDER — GABAPENTIN 300 MG PO CAPS
ORAL_CAPSULE | ORAL | Status: AC
  Filled 2023-03-07: qty 1

## 2023-03-07 MED ORDER — GLYCOPYRROLATE 0.2 MG/ML IJ SOLN
INTRAMUSCULAR | Status: DC | PRN
  Administered 2023-03-07: .2 mg via INTRAVENOUS

## 2023-03-07 MED ORDER — BUPIVACAINE LIPOSOME 1.3 % IJ SUSP
INTRAMUSCULAR | Status: AC
  Filled 2023-03-07: qty 20

## 2023-03-07 SURGICAL SUPPLY — 32 items
ADH SKN CLS APL DERMABOND .7 (GAUZE/BANDAGES/DRESSINGS) ×1
APL PRP STRL LF DISP 70% ISPRP (MISCELLANEOUS)
BLADE SURG 15 STRL LF DISP TIS (BLADE) ×1 IMPLANT
BLADE SURG 15 STRL SS (BLADE) ×1
CHLORAPREP W/TINT 26 (MISCELLANEOUS) ×1 IMPLANT
DERMABOND ADVANCED .7 DNX12 (GAUZE/BANDAGES/DRESSINGS) ×1 IMPLANT
DRAPE LAPAROTOMY 100X77 ABD (DRAPES) ×1 IMPLANT
DRAPE LEGGINS SURG 28X43 STRL (DRAPES) IMPLANT
DRAPE UNDER BUTTOCK W/FLU (DRAPES) IMPLANT
ELECT REM PT RETURN 9FT ADLT (ELECTROSURGICAL) ×1
ELECTRODE REM PT RTRN 9FT ADLT (ELECTROSURGICAL) ×1 IMPLANT
GAUZE 4X4 16PLY ~~LOC~~+RFID DBL (SPONGE) ×1 IMPLANT
GLOVE BIO SURGEON STRL SZ7 (GLOVE) ×1 IMPLANT
GOWN STRL REUS W/ TWL LRG LVL3 (GOWN DISPOSABLE) ×2 IMPLANT
GOWN STRL REUS W/TWL LRG LVL3 (GOWN DISPOSABLE) ×2
KIT TURNOVER KIT A (KITS) ×1 IMPLANT
LABEL OR SOLS (LABEL) ×1 IMPLANT
MANIFOLD NEPTUNE II (INSTRUMENTS) ×1 IMPLANT
NDL HYPO 22X1.5 SAFETY MO (MISCELLANEOUS) ×1 IMPLANT
NEEDLE HYPO 22X1.5 SAFETY MO (MISCELLANEOUS) ×1 IMPLANT
NS IRRIG 500ML POUR BTL (IV SOLUTION) ×1 IMPLANT
PACK BASIN MINOR ARMC (MISCELLANEOUS) ×1 IMPLANT
SHEARS HARMONIC ACE PLUS 36CM (ENDOMECHANICALS) IMPLANT
SPONGE T-LAP 18X18 ~~LOC~~+RFID (SPONGE) ×1 IMPLANT
SUT MNCRL 4-0 (SUTURE)
SUT MNCRL 4-0 27XMFL (SUTURE)
SUT VIC AB 0 CT1 36 (SUTURE) ×1 IMPLANT
SUT VIC AB 2-0 CT2 27 (SUTURE) ×1 IMPLANT
SUTURE MNCRL 4-0 27XMF (SUTURE) ×1 IMPLANT
SYR 10ML LL (SYRINGE) ×1 IMPLANT
TRAP FLUID SMOKE EVACUATOR (MISCELLANEOUS) ×1 IMPLANT
WATER STERILE IRR 500ML POUR (IV SOLUTION) ×1 IMPLANT

## 2023-03-07 NOTE — Transfer of Care (Signed)
Immediate Anesthesia Transfer of Care Note  Patient: Dawn Kane  Procedure(s) Performed: EXCISION MASS, rectal  Patient Location: PACU  Anesthesia Type:General  Level of Consciousness: awake, drowsy, and patient cooperative  Airway & Oxygen Therapy: Patient Spontanous Breathing and Patient connected to face mask oxygen  Post-op Assessment: Report given to RN and Post -op Vital signs reviewed and stable  Post vital signs: Reviewed and stable  Last Vitals:  Vitals Value Taken Time  BP 91/60 03/07/23 1106  Temp    Pulse 72 03/07/23 1108  Resp 25 03/07/23 1108  SpO2 97 % 03/07/23 1108  Vitals shown include unvalidated device data.  Last Pain:  Vitals:   03/07/23 0849  TempSrc: Temporal  PainSc: 7          Complications: No notable events documented.
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
## 2023-03-07 NOTE — Anesthesia Procedure Notes (Signed)
Procedure Name: General with mask airway Date/Time: 03/07/2023 10:34 AM  Performed by: Mohammed Kindle, CRNAPre-anesthesia Checklist: Patient identified, Emergency Drugs available, Suction available and Patient being monitored Patient Re-evaluated:Patient Re-evaluated prior to induction Oxygen Delivery Method: Simple face mask Induction Type: IV induction Placement Confirmation: positive ETCO2, breath sounds checked- equal and bilateral and CO2 detector Dental Injury: Teeth and Oropharynx as per pre-operative assessment

## 2023-03-07 NOTE — Op Note (Signed)
  03/07/2023  11:19 AM  PATIENT:  Dawn Kane  52 y.o. female  PRE-OPERATIVE DIAGNOSIS:  Anorectal lesion  POST-OPERATIVE DIAGNOSIS:  Same  PROCEDURE:   Excision internal and external hemorrhoid anterior midline   SURGEON:  Surgeon(s) and Role:    * Meline Russaw F, MD - Primary  ASSISTANTS: none  ANESTHESIA: General MAC  FINDINGS:  Excision internal and external hemorrhoid anterior midline    DICTATION:  Patient was explained about the  procedure in detail, risks benefits possible complications and a consent was obtained. The patient taken to the operating room and placed in the modified lithotomy position w all pressure points padded.  Exam revealed an internal and external hemorrhoidal cushion on ant midline. THis was elevated with an allis and using harmonic scalpel it was excised. Excellent hemostasis observed. Liposomal Marcaine  was injected around the wound site. Needle and laparotomy counts were correct and there were no immediate complications  Leafy Ro, MD

## 2023-03-07 NOTE — Telephone Encounter (Signed)
Requested medication (s) are due for refill today - yes  Requested medication (s) are on the active medication list -yes  Future visit scheduled -yes  Last refill: 01/13/23 #90  Notes to clinic: non delegated Rx  Requested Prescriptions  Pending Prescriptions Disp Refills   tiZANidine (ZANAFLEX) 4 MG tablet [Pharmacy Med Name: TIZANIDINE HCL 4 MG TABLET] 90 tablet 0    Sig: TAKE 1 TABLET BY MOUTH EVERY 6 HOURS AS NEEDED FOR MUSCLE SPASMS.     Not Delegated - Cardiovascular:  [facility] - tizanidine Failed - 03/07/2023 12:41 PM      Failed - This refill cannot be delegated      Passed - Valid encounter within last 6 months    Recent Outpatient Visits           2 weeks ago Rectal mass   [facility] [name], DO   1 month ago Annual physical exam   [facility] [name], DO   2 months ago Sciatica of left side   [facility] [name], DO   3 months ago Hypothyroidism, unspecified type   [facility] [name], DO       Future Appointments             In 4 months [name], DO [place] [facility]               Requested Prescriptions  Pending Prescriptions Disp Refills   tiZANidine (ZANAFLEX) 4 MG tablet [Pharmacy Med Name: TIZANIDINE HCL 4 MG TABLET] 90 tablet 0    Sig: TAKE 1 TABLET BY MOUTH EVERY 6 HOURS AS NEEDED FOR MUSCLE SPASMS.     Not Delegated - Cardiovascular:  [facility] - tizanidine Failed - 03/07/2023 12:41 PM      Failed - This refill cannot be delegated      Passed - Valid encounter within last 6 months    Recent Outpatient Visits           2 weeks ago Rectal mass   [facility] [name], DO   1 month ago Annual physical exam   [facility] [name], DO   2 months ago Sciatica of left  side   [facility] [name], DO   3 months ago Hypothyroidism, unspecified type   [facility] [name], DO       Future Appointments             In 4 months [name], DO [facility], [facility]

## 2023-03-07 NOTE — Interval H&P Note (Signed)
History and Physical Interval Note:  03/07/2023 9:54 AM  Dawn Kane  has presented today for surgery, with the diagnosis of rectal mass.  The various methods of treatment have been discussed with the patient and family. After consideration of risks, benefits and other options for treatment, the patient has consented to  Procedure(s): EXCISION MASS, rectal (N/A) as a surgical intervention.  The patient's history has been reviewed, patient examined, no change in status, stable for surgery.  I have reviewed the patient's chart and labs.  Questions were answered to the patient's satisfaction.     [name] F Ollie Delano

## 2023-03-07 NOTE — Anesthesia Postprocedure Evaluation (Signed)
Anesthesia Post Note  Patient: Dawn Kane  Procedure(s) Performed: EXCISION MASS, rectal  Patient location during evaluation: PACU Anesthesia Type: General Level of consciousness: awake and alert Pain management: pain level controlled Vital Signs Assessment: post-procedure vital signs reviewed and stable Respiratory status: spontaneous breathing, nonlabored ventilation, respiratory function stable and patient connected to nasal cannula oxygen Cardiovascular status: blood pressure returned to baseline and stable Postop Assessment: no apparent nausea or vomiting Anesthetic complications: no  No notable events documented.   Last Vitals:  Vitals:   03/07/23 1200 03/07/23 1213  BP: (!) 150/90 (!) 162/89  Pulse: 66 66  Resp: 14 16  Temp:  (!) 36.2 C  SpO2: 95% 94%    Last Pain:  Vitals:   03/07/23 1213  TempSrc: Temporal  PainSc: 0-No pain                 Stephanie Coup

## 2023-03-07 NOTE — Discharge Instructions (Addendum)
AMBULATORY SURGERY  DISCHARGE INSTRUCTIONS   The drugs that you were given will stay in your system until tomorrow so for the next 24 hours you should not:  Drive an automobile Make any legal decisions Drink any alcoholic beverage   You may resume regular meals tomorrow.  Today it is better to start with liquids and gradually work up to solid foods.  You may eat anything you prefer, but it is better to start with liquids, then soup and crackers, and gradually work up to solid foods.   Please notify your doctor immediately if you have any unusual bleeding, trouble breathing, redness and pain at the surgery site, drainage, fever, or pain not relieved by medication.    Additional Instructions:        Please contact your physician with any problems or Same Day Surgery at [phone], Monday through Friday 6 am to 4 pm, or [place] at [facility] number at [phone].  Information for Discharge Teaching: EXPAREL (bupivacaine liposome injectable suspension)   Your surgeon or anesthesiologist gave you EXPAREL(bupivacaine) to help control your pain after surgery.  EXPAREL is a local anesthetic that provides pain relief by numbing the tissue around the surgical site. EXPAREL is designed to release pain medication over time and can control pain for up to 72 hours. Depending on how you respond to EXPAREL, you may require less pain medication during your recovery.  Possible side effects: Temporary loss of sensation or ability to move in the area where bupivacaine was injected. Nausea, vomiting, constipation Rarely, numbness and tingling in your mouth or lips, lightheadedness, or anxiety may occur. Call your doctor right away if you think you may be experiencing any of these sensations, or if you have other questions regarding possible side effects.  Follow all other discharge instructions given to you by your surgeon or nurse. Eat a healthy diet and drink plenty of water or  other fluids.  If you return to the hospital for any reason within 96 hours following the administration of EXPAREL, it is important for health care providers to know that you have received this anesthetic. A teal colored band has been placed on your arm with the date, time and amount of EXPAREL you have received in order to alert and inform your health care providers. Please leave this armband in place for the full 96 hours following administration, and then you may remove the band. Surgical Procedures for Hemorrhoids, Care After After surgery for hemorrhoids, it is common to have: Pain in your rectum for 2-4 weeks after the procedure, and may take 1-2 months to fully recover. Pain when you are pooping. A small amount of bleeding from your rectum. This is more likely to happen the first time you poop after surgery. Follow these instructions at home: Medicines Take over-the-counter and prescription medicines only as told by your health care provider. If you were prescribed antibiotics, use them as told by your provider. Do not stop using the antibiotic even if you start to feel better. Ask your provider if the medicine prescribed to you requires you to avoid driving or using machinery. Use a stool softener or a medicine that helps you poop (laxative) as told by your provider. Eating and drinking Follow instructions from your provider about what you may eat and drink after the surgery. You may need to take these actions to prevent or treat constipation: Drink enough fluid to keep your pee (urine) pale yellow. Take over-the-counter or prescription medicines. Eat foods that are  high in fiber, such as beans, whole grains, and fresh fruits and vegetables. Limit foods that are high in fat and processed sugars, such as fried or sweet foods. Managing pain and swelling  Take warm sitz baths for 15-20 minutes, 2-3 times a day to ease soreness and itching. This will also help keep the rectal area  clean. If told, put ice on the affected area. It may help to use ice packs between sitz baths. Put ice in a plastic bag. Place a towel between your skin and the bag. Leave the ice on for 20 minutes, 2-3 times a day. If your skin turns bright red, remove the ice right away to prevent skin damage. The risk of damage is higher if you cannot feel pain, heat, or cold. Activity Rest as told by your provider. Do not sit for a long time without moving. Get up to take short walks every 1-2 hours. This will improve blood flow and breathing. Ask for help if you feel weak or unsteady. You may have to avoid lifting. Ask your provider how much you can safely lift. Return to your normal activities as told by your provider. Ask your provider what activities are safe for you. General instructions Do not strain to poop. Do not spend a long time sitting on the toilet. If you were given a sedative during the surgery, it can affect you for several hours. Do not drive or operate machinery until your provider says that it is safe. Your provider may give you more instructions. Make sure you know what you can and cannot do. Contact a health care provider if: Your pain medicine is not helping. You have a fever or chills. You have drainage that smells bad. You have a lot of swelling. You become constipated. You have trouble peeing (urinating). You have very bad pain in your rectum. Get help right away if: You are bleeding a lot from your rectum. This information is not intended to replace advice given to you by your health care provider. Make sure you discuss any questions you have with your health care provider. Document Revised: 05/27/2022 Document Reviewed: 05/27/2022 Elsevier Patient Education  2023 [employer].

## 2023-03-08 ENCOUNTER — Encounter: Payer: Self-pay | Admitting: Surgery

## 2023-03-10 LAB — SURGICAL PATHOLOGY
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
## 2023-04-06 ENCOUNTER — Ambulatory Visit: Payer: Medicaid Other | Admitting: Gastroenterology

## 2023-04-16 ENCOUNTER — Other Ambulatory Visit: Payer: Self-pay | Admitting: Internal Medicine

## 2023-04-16 DIAGNOSIS — E039 Hypothyroidism, unspecified: Secondary | ICD-10-CM

## 2023-04-16 DIAGNOSIS — G8929 Other chronic pain: Secondary | ICD-10-CM

## 2023-04-16 DIAGNOSIS — M5432 Sciatica, left side: Secondary | ICD-10-CM

## 2023-04-17 ENCOUNTER — Other Ambulatory Visit: Payer: Self-pay | Admitting: Internal Medicine

## 2023-04-18 ENCOUNTER — Other Ambulatory Visit: Payer: Self-pay

## 2023-04-18 DIAGNOSIS — G8929 Other chronic pain: Secondary | ICD-10-CM

## 2023-04-18 DIAGNOSIS — E039 Hypothyroidism, unspecified: Secondary | ICD-10-CM

## 2023-04-18 DIAGNOSIS — M5432 Sciatica, left side: Secondary | ICD-10-CM

## 2023-04-18 NOTE — Telephone Encounter (Signed)
Requested medication (s) are due for refill today: yes to both   Requested medication (s) are on the active medication list: yes to both   Last refill:  tizanidine: 03/08/23 #90   levothyroxine: 01/02/23  Future visit scheduled: yes  Notes to clinic:  Tizanidine: med not delegated to NT to RF                           Levothyroxine: 01/02/23- was due for f/u lab work in March-     Requested Prescriptions  Pending Prescriptions Disp Refills   tiZANidine (ZANAFLEX) 4 MG tablet [Pharmacy Med Name: TIZANIDINE HCL 4 MG TABLET] 90 tablet 0    Sig: TAKE 1 TABLET BY MOUTH EVERY 6 HOURS AS NEEDED FOR MUSCLE SPASMS.     Not Delegated - Cardiovascular:  Alpha-2 Agonists - tizanidine Failed - 04/16/2023  4:43 PM      Failed - This refill cannot be delegated      Passed - Valid encounter within last 6 months    Recent Outpatient Visits           1 month ago Rectal mass   [facility] [name], DO   3 months ago Annual physical exam   [facility] [name], DO   4 months ago Sciatica of left side   [facility] [name], DO   4 months ago Hypothyroidism, unspecified type   [facility] [name], DO       Future Appointments             In 2 months [name], DO [facility]             levothyroxine (SYNTHROID) 112 MCG tablet [Pharmacy Med Name: LEVOTHYROXINE 112 MCG TABLET] 30 tablet 1    Sig: TAKE 1 TABLET BY MOUTH EVERY DAY     Endocrinology:  Hypothyroid Agents Failed - 04/16/2023  4:43 PM      Failed - TSH in normal range and within 360 days    TSH  Date Value Ref Range Status  12/02/2022 0.28 (L) mIU/L Final    Comment:              Reference Range .           > or = 20 Years  0.40-4.50 .                Pregnancy Ranges           First trimester    0.26-2.66           Second trimester    0.55-2.73           Third trimester    0.43-2.91          Passed - Valid encounter within last 12 months    Recent Outpatient Visits           1 month ago Rectal mass   [facility] [name], DO   3 months ago Annual physical exam   [facility] [name], DO   4 months ago Sciatica of left side   [facility] [name], DO   4 months ago Hypothyroidism, unspecified type   [facility] [name], DO       Future Appointments  In 2 months [name], DO [facility], [facility]

## 2023-04-18 NOTE — Telephone Encounter (Signed)
Requested medication (s) are due for refill today: yes  Requested medication (s) are on the active medication list: hx med  Last refill:  03/02/23  Future visit scheduled: yes  Notes to clinic:  hx provider   Requested Prescriptions  Pending Prescriptions Disp Refills   meloxicam (MOBIC) 7.5 MG tablet [Pharmacy Med Name: MELOXICAM 7.5 MG TABLET] 90 tablet     Sig: TAKE 1 TABLET (7.5 MG TOTAL) BY MOUTH AS NEEDED.     Analgesics:  COX2 Inhibitors Failed - 04/17/2023 12:14 PM      Failed - Manual Review: Labs are only required if the patient has taken medication for more than 8 weeks.      Failed - Cr in normal range and within 360 days    Creat  Date Value Ref Range Status  12/02/2022 0.90 0.50 - 1.03 mg/dL Final   Creatinine, Ser  Date Value Ref Range Status  03/06/2023 1.01 (H) 0.44 - 1.00 mg/dL Final         Passed - HGB in normal range and within 360 days    Hemoglobin  Date Value Ref Range Status  03/06/2023 14.7 12.0 - 15.0 g/dL Final         Passed - HCT in normal range and within 360 days    HCT  Date Value Ref Range Status  03/06/2023 45.8 36.0 - 46.0 % Final         Passed - AST in normal range and within 360 days    AST  Date Value Ref Range Status  12/02/2022 12 10 - 35 U/L Final         Passed - ALT in normal range and within 360 days    ALT  Date Value Ref Range Status  12/02/2022 18 6 - 29 U/L Final  03/20/2018 7 6 - 29 U/L Final         Passed - eGFR is 30 or above and within 360 days    GFR, Est African American  Date Value Ref Range Status  03/20/2018 56 (L) > OR = 60 mL/min/1.77m2 Final   GFR calc Af Amer  Date Value Ref Range Status  03/05/2019 43 (L) >60 mL/min Final   GFR, Est Non African American  Date Value Ref Range Status  03/20/2018 48 (L) > OR = 60 mL/min/1.64m2 Final   GFR, Estimated  Date Value Ref Range Status  03/06/2023 >60 >60 mL/min Final    Comment:    (NOTE) Calculated using the CKD-EPI Creatinine Equation  (2021)    eGFR  Date Value Ref Range Status  12/02/2022 77 > OR = 60 mL/min/1.26m2 Final         Passed - Patient is not pregnant      Passed - Valid encounter within last 12 months    Recent Outpatient Visits           1 month ago Rectal mass   [facility] [name], DO   3 months ago Annual physical exam   [facility] [name], DO   4 months ago Sciatica of left side   [facility] [name], DO   4 months ago Hypothyroidism, unspecified type   [facility] [name], DO       Future Appointments             In 2 months [name], DO [facility], [facility]

## 2023-04-19 ENCOUNTER — Other Ambulatory Visit: Payer: Self-pay

## 2023-04-25 ENCOUNTER — Telehealth: Payer: Self-pay | Admitting: Internal Medicine

## 2023-04-25 NOTE — Telephone Encounter (Signed)
Pt had appt for 9.19.2024 but had to reschedule for 9.30.2024 due to Dr Caralee Ates being out of the office. She would like to know if levothryroxine is one of the medications that is required for the patient to stay out of the heat/sunlight. When she goes out side she tend to get lightheaded stomach ache and weird. She has tried not taking the medication and going out side and it does not do it. She is currently at [employer] for son wedding but will be returning on the 11th. It is okay to give her a call.

## 2023-04-25 NOTE — Telephone Encounter (Signed)
Pt.notified

## 2023-05-11 ENCOUNTER — Encounter: Payer: Self-pay | Admitting: Family Medicine

## 2023-05-11 ENCOUNTER — Ambulatory Visit (INDEPENDENT_AMBULATORY_CARE_PROVIDER_SITE_OTHER): Payer: Medicare Other | Admitting: Family Medicine

## 2023-05-11 VITALS — BP 120/68 | HR 73 | Temp 98.1°F | Resp 18 | Ht 62.5 in | Wt 163.4 lb

## 2023-05-11 DIAGNOSIS — M5440 Lumbago with sciatica, unspecified side: Secondary | ICD-10-CM | POA: Diagnosis not present

## 2023-05-11 DIAGNOSIS — M5432 Sciatica, left side: Secondary | ICD-10-CM

## 2023-05-11 DIAGNOSIS — G8929 Other chronic pain: Secondary | ICD-10-CM

## 2023-05-11 DIAGNOSIS — E039 Hypothyroidism, unspecified: Secondary | ICD-10-CM

## 2023-05-11 MED ORDER — MELOXICAM 15 MG PO TABS
7.5000 mg | ORAL_TABLET | Freq: Every day | ORAL | 1 refills | Status: AC
Start: 2023-05-11 — End: ?

## 2023-05-11 MED ORDER — TIZANIDINE HCL 4 MG PO TABS
4.0000 mg | ORAL_TABLET | Freq: Four times a day (QID) | ORAL | 0 refills | Status: DC | PRN
Start: 2023-05-11 — End: 2023-08-18

## 2023-05-11 MED ORDER — PREGABALIN 25 MG PO CAPS
25.0000 mg | ORAL_CAPSULE | Freq: Two times a day (BID) | ORAL | 0 refills | Status: DC
Start: 2023-05-11 — End: 2023-06-12

## 2023-05-11 NOTE — Progress Notes (Signed)
Patient ID: Dawn Kane, female    DOB: October 05, 1971, 52 y.o.   MRN: [id number]  PCP: Dawn Mail, DO  Chief Complaint  Patient presents with   Pain    All over body states meloxicam not helping and she can not function.   non healing lesions   Hypothyroidism    Feels like thyroid dose could be off    Subjective:   Dawn Kane is a 52 y.o. female, presents to clinic with CC of the following:  HPI   Pt complains of chronic pain to spine/back and notes this for years - disability as of 2021 She is currently on mobic, muscle relaxers and states she tried gabapentin 300 in the past did not tolerate She got pain meds from the surgeon after a small procedure and she states that helped her a lot She has not tried f/up with neurosurgeon, doing PT or PM&R consult, has not tried other meds such as cymbalta She report severe constant back pain with radiation to left leg for years also notes previously being recommended for back surgery that she did not want to do She has not consulted with pain management or spine/ortho recently  Other complaints which we discussed could not be addressed at the same time Dizziness, thyroid concerns   Patient Active Problem List   Diagnosis Date Noted   Anal skin tag 03/07/2023   Chronic obstructive pulmonary disease (HCC) 12/02/2022   Gastroesophageal reflux disease 12/02/2022   Chronic midline low back pain with sciatica 12/02/2022   Adnexal mass 06/18/2019   Pyelonephritis of right kidney 03/03/2019   Chronic hepatitis C without hepatic coma (HCC) 08/01/2017   Sepsis (HCC) 07/09/2016   CAP (community acquired pneumonia) 07/09/2016   Anxiety 07/09/2016   Hypothyroidism 07/09/2016      Current Outpatient Medications:    albuterol (VENTOLIN HFA) 108 (90 Base) MCG/ACT inhaler, Inhale 1-2 puffs into the lungs every 6 (six) hours as needed for wheezing or shortness of breath., Disp: 1 each, Rfl: 2   ipratropium-albuterol (DUONEB)  0.5-2.5 (3) MG/3ML SOLN, Take 3 mLs by nebulization every 4 (four) hours as needed., Disp: 360 mL, Rfl: 2   levothyroxine (SYNTHROID) 112 MCG tablet, Take 1 tablet (112 mcg total) by mouth daily. (Patient taking differently: Take 112 mcg by mouth daily before breakfast.), Disp: 90 tablet, Rfl: 0   loratadine (CLARITIN) 10 MG tablet, Take 1 tablet (10 mg total) by mouth daily. (Patient taking differently: Take 10 mg by mouth as needed.), Disp: 30 tablet, Rfl: 11   meloxicam (MOBIC) 7.5 MG tablet, Take 7.5 mg by mouth daily., Disp: , Rfl:    Multiple Vitamins-Minerals (CENTRUM WOMEN) TABS, Take 1 tablet by mouth 3 (three) times a week., Disp: , Rfl:    omeprazole (PRILOSEC OTC) 20 MG tablet, Take 20 mg by mouth every morning., Disp: , Rfl:    rosuvastatin (CRESTOR) 20 MG tablet, Take 20 mg by mouth daily., Disp: , Rfl:    simethicone (MYLICON) 80 MG chewable tablet, Chew 80 mg by mouth every 6 (six) hours as needed for flatulence., Disp: , Rfl:    tiZANidine (ZANAFLEX) 4 MG tablet, TAKE 1 TABLET BY MOUTH EVERY 6 HOURS AS NEEDED FOR MUSCLE SPASMS., Disp: 90 tablet, Rfl: 0   Allergies  Allergen Reactions   Amoxicillin Nausea And Vomiting and Other (See Comments)    Has patient had a PCN reaction causing immediate rash, facial/tongue/throat swelling, SOB or lightheadedness with hypotension: No Has patient had a  PCN reaction causing severe rash involving mucus membranes or skin necrosis: No Has patient had a PCN reaction that required hospitalization No Has patient had a PCN reaction occurring within the last 10 years: No If all of the above answers are "NO", then may proceed with Cephalosporin use.    Aspirin Nausea And Vomiting     Social History   Tobacco Use   Smoking status: Every Day    Current packs/day: 1.50    Average packs/day: 1.5 packs/day for 38.0 years (57.0 ttl pk-yrs)    Types: Cigarettes   Smokeless tobacco: Never  Vaping Use   Vaping status: Never Used  Substance Use  Topics   Alcohol use: No    Comment: quit in 2016   Drug use: Yes    Frequency: 3.0 times per week    Types: Marijuana    Comment: occ      Chart Review Today: I personally reviewed active problem list, medication list, allergies, family history, social history, health maintenance, notes from last encounter, lab results, imaging with the patient/caregiver today.   Review of Systems  Constitutional: Negative.   HENT: Negative.    Eyes: Negative.   Respiratory: Negative.    Cardiovascular: Negative.   Gastrointestinal: Negative.   Endocrine: Negative.   Genitourinary: Negative.   Musculoskeletal: Negative.   Skin: Negative.   Allergic/Immunologic: Negative.   Neurological: Negative.   Hematological: Negative.   Psychiatric/Behavioral: Negative.    All other systems reviewed and are negative.      Objective:   Vitals:   05/11/23 1140  BP: 120/68  Pulse: 73  Resp: 18  Temp: 98.1 F (36.7 C)  SpO2: 97%  Weight: 163 lb 6.4 oz (74.1 kg)  Height: 5' 2.5" (1.588 m)    Body mass index is 29.41 kg/m.  Physical Exam Vitals and nursing note reviewed.  Constitutional:      General: She is not in acute distress.    Appearance: Normal appearance. She is well-developed. She is not ill-appearing, toxic-appearing or diaphoretic.  HENT:     Head: Normocephalic and atraumatic.     Nose: Nose normal.  Eyes:     General:        Right eye: No discharge.        Left eye: No discharge.     Conjunctiva/sclera: Conjunctivae normal.  Neck:     Trachea: No tracheal deviation.  Cardiovascular:     Rate and Rhythm: Normal rate and regular rhythm.  Pulmonary:     Effort: Pulmonary effort is normal. No respiratory distress.     Breath sounds: No stridor.  Skin:    General: Skin is warm and dry.     Findings: No rash.  Neurological:     Mental Status: She is alert.     Motor: No abnormal muscle tone.     Coordination: Coordination normal.     Gait: Gait abnormal (antalgic).   Psychiatric:        Behavior: Behavior normal.      Results for orders placed or performed during the hospital encounter of 03/07/23  Surgical pathology  Result Value Ref Range   SURGICAL PATHOLOGY      SURGICAL PATHOLOGY CASE: [id number] PATIENT: Dawn Kane Surgical Pathology Report     Specimen Submitted: A. Hemorrhoid, external  Clinical History: Rectal mass      DIAGNOSIS: A.  HEMORRHOID, EXTERNAL; EXCISION: - HEMORRHOID. - NEGATIVE FOR MALIGNANCY.  GROSS DESCRIPTION: A. Labeled: External hemorrhoid Received: Fresh Collection time: 10:54  AM on 03/07/2023 Placed into formalin time: 12:24 PM on 03/07/2023 Tissue fragment(s): 1 Size: 1.4 x 0.8 x 0.7 cm Description: Received is a tan to pink, wrinkled fragment of skin.  The resection margin is inked black and the fragment is bisected. Entirely submitted in 1 cassette.  CM 03/07/2023  Final Diagnosis performed by Elijah Birk, MD.   Electronically signed 03/10/2023 4:42:47PM The electronic signature indicates that the named Attending Pathologist has evaluated the specimen Technical component performed at [facility], [address] [place], [place] [address] Lab: [phone] Dir: Jolene Schimke, MD,  MMM  Professional component performed at [facility], [facility], [address] [place], [place], [place] [address] Lab: [phone] Dir: Beryle Quant, MD        Assessment & Plan:   1. Chronic midline low back pain with sciatica, sciatica laterality unspecified I encouraged the pt to stay on her mobic and muscle relaxers and try adding lyrica to this - starting with one dose at bedtime to see if she tolerates it, and then once she knows how she does with it add the morning dose She reports 300 mg gabapentin in the past knocked her out- she was willing to try lower dose - but I recommended trying lyrica if covered by insurance Other meds like cymbalta may be helpful and beyond that she would need  pain management for narcotic meds She does still seem like pain/injury was all fairly recent (2021) and she refused surgery, she may want to consult with neurosurgery and PM&R again to see what other tx options there are to improve function and daily QOL and decrease pain.  - pregabalin (LYRICA) 25 MG capsule; Take 1 capsule (25 mg total) by mouth 2 (two) times daily.  Dispense: 60 capsule; Refill: 0 - Ambulatory referral to Physical Medicine Rehab - tiZANidine (ZANAFLEX) 4 MG tablet; Take 1 tablet (4 mg total) by mouth every 6 (six) hours as needed for muscle spasms.  Dispense: 90 tablet; Refill: 0 - meloxicam (MOBIC) 15 MG tablet; Take 0.5-1 tablets (7.5-15 mg total) by mouth daily.  Dispense: 90 tablet; Refill: 1 - Ambulatory referral to Spine Surgery  2. Sciatica of left side See #1 It seems chronic and not episodic or a flare, doubt prednisone would help  - pregabalin (LYRICA) 25 MG capsule; Take 1 capsule (25 mg total) by mouth 2 (two) times daily.  Dispense: 60 capsule; Refill: 0 - Ambulatory referral to Physical Medicine Rehab - tiZANidine (ZANAFLEX) 4 MG tablet; Take 1 tablet (4 mg total) by mouth every 6 (six) hours as needed for muscle spasms.  Dispense: 90 tablet; Refill: 0 - meloxicam (MOBIC) 15 MG tablet; Take 0.5-1 tablets (7.5-15 mg total) by mouth daily.  Dispense: 90 tablet; Refill: 1 - Ambulatory referral to Spine Surgery  3. Hypothyroidism, unspecified type Her labs were previously ordered by her PCP but not completed - reprinted them and put them up front Pt given lab hours to come and complete and recommend she get done prior to f/up in 2 weeks  Return for 2 weeks dr Caralee Ates for pain f/up also needs AWV.       Danelle Berry, PA-C 05/11/23 11:54 AM

## 2023-05-11 NOTE — Patient Instructions (Signed)
Lab hours for walk in labs Any weekday we are open Come into office lobby and tell front office staff that you are here for labs - they will pull your paper work and get you in line for the lab tech  8-11:30 am or 1:30 to 3:30 pm

## 2023-05-16 ENCOUNTER — Other Ambulatory Visit: Payer: Self-pay

## 2023-05-16 DIAGNOSIS — E039 Hypothyroidism, unspecified: Secondary | ICD-10-CM

## 2023-05-17 ENCOUNTER — Other Ambulatory Visit: Payer: Self-pay | Admitting: Internal Medicine

## 2023-05-17 DIAGNOSIS — E039 Hypothyroidism, unspecified: Secondary | ICD-10-CM

## 2023-05-18 NOTE — Telephone Encounter (Signed)
Requested Prescriptions  Pending Prescriptions Disp Refills   levothyroxine (SYNTHROID) 112 MCG tablet [Pharmacy Med Name: LEVOTHYROXINE 112 MCG TABLET] 90 tablet 3    Sig: TAKE 1 TABLET BY MOUTH EVERY DAY     Endocrinology:  Hypothyroid Agents Passed - 05/17/2023  9:03 AM      Passed - TSH in normal range and within 360 days    TSH  Date Value Ref Range Status  05/16/2023 2.08 mIU/L Final    Comment:              Reference Range .           > or = 20 Years  0.40-4.50 .                Pregnancy Ranges           First trimester    0.26-2.66           Second trimester   0.55-2.73           Third trimester    0.43-2.91          Passed - Valid encounter within last 12 months    Recent Outpatient Visits           1 week ago Chronic midline low back pain with sciatica, sciatica laterality unspecified   [facility] Danelle Berry, PA-C   2 months ago Rectal mass   [facility] [name], DO   4 months ago Annual physical exam   [facility] [name], DO   5 months ago Sciatica of left side   [facility] [name], DO   5 months ago Hypothyroidism, unspecified type   [facility] [name], DO       Future Appointments             In 1 week [name], DO [facility]   In 2 months [name], DO [facility], [facility]
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
## 2023-05-24 NOTE — Progress Notes (Unsigned)
   Acute Office Visit  Subjective:     Patient ID: Dawn Kane, female    DOB: 06/29/1971, 52 y.o.   MRN: [id number]  No chief complaint on file.   HPI Patient is in today for follow up on back pain. Was seen 05/11/23, was started on lyrica.   ROS      Objective:    LMP 06/17/2016 Comment: neg preg test {Vitals History (Optional):23777}  Physical Exam  No results found for any visits on 05/25/23.      Assessment & Plan:   Problem List Items Addressed This Visit   None   No orders of the defined types were placed in this encounter.   No follow-ups on file.  [name], DO

## 2023-05-25 ENCOUNTER — Telehealth (INDEPENDENT_AMBULATORY_CARE_PROVIDER_SITE_OTHER): Payer: Medicare Other | Admitting: Internal Medicine

## 2023-05-25 ENCOUNTER — Encounter: Payer: Self-pay | Admitting: Internal Medicine

## 2023-05-25 ENCOUNTER — Telehealth: Payer: Self-pay

## 2023-05-25 DIAGNOSIS — E039 Hypothyroidism, unspecified: Secondary | ICD-10-CM

## 2023-05-25 DIAGNOSIS — M5442 Lumbago with sciatica, left side: Secondary | ICD-10-CM

## 2023-05-25 DIAGNOSIS — E782 Mixed hyperlipidemia: Secondary | ICD-10-CM | POA: Diagnosis not present

## 2023-05-25 DIAGNOSIS — G8929 Other chronic pain: Secondary | ICD-10-CM

## 2023-05-25 MED ORDER — ROSUVASTATIN CALCIUM 20 MG PO TABS: 20.0000 mg | ORAL_TABLET | Freq: Every day | ORAL | 1 refills | Status: DC

## 2023-05-25 NOTE — Progress Notes (Signed)
Virtual Visit via Video Note  I connected with Dawn Kane on 05/25/23 at  1:40 PM EDT by a video enabled telemedicine application and verified that I am speaking with the correct person using two identifiers.  Location: Patient: Home Provider: Home due to weather   I discussed the limitations of evaluation and management by telemedicine and the availability of in person appointments. The patient expressed understanding and agreed to proceed.  History of Present Illness:  Patient presents via telemedicine for follow up on back pain.  She was seen in the office for this issue on 05/11/23. She has had chronic back pain for years. Had been taking Mobic, muscle relaxers and Gabapentin but had side effects. Was recently given opioids after having an outpatient surgical procedure and states it improved her pain significantly. She was started on Lyrica 25 mg BID and a referral to PMR and Neurosurgery was placed. She does have appointments scheduled. Lumbar x-rays were obtained 2/24 which showed degenerative changes without much change compared to 05/2021. She is doing well with the Lyrica, taking it twice a day, sometimes would like to take it 3 times a day but concerned it will make her too sleepy at work. Also taking the Mobic and Zanaflex at night.   Thyroid labs from 7/30 normal, TSH well controlled. She is taking the Levothyroxine 112 mcg appropriately.   HLD: -Medications: Had been on Crestor 20 mg but not taking currently  -Last lipid panel: Lipid Panel     Component Value Date/Time   CHOL 291 (H) 12/02/2022 1115   TRIG 245 (H) 12/02/2022 1115   HDL 43 (L) 12/02/2022 1115   CHOLHDL 6.8 (H) 12/02/2022 1115   LDLCALC 203 (H) 12/02/2022 1115   The 10-year ASCVD risk score (Arnett DK, et al., 2019) is: 7.4%   Values used to calculate the score:     Age: 52 years     Sex: Female     Is Non-Hispanic African American: No     Diabetic: No     Tobacco smoker: Yes     Systolic Blood  Pressure: 120 mmHg     Is BP treated: No     HDL Cholesterol: 43 mg/dL     Total Cholesterol: 291 mg/dL    Observations/Objective:  General: well appearing, no acute distress ENT: conjunctiva normal appearing bilaterally  Skin: no rashes, cyanosis or abnormal bruising noted Neuro: Answers all questions appropriately   Assessment and Plan:  1. Chronic midline low back pain with left-sided sciatica: Patient doing better, pain improved.  Continue meloxicam, Zanaflex at night and Lyrica 25 mg can increase to 3 times daily dosing as she can tolerate.  She does have an appointment scheduled with neurosurgery next week.   2. Hypothyroidism, unspecified type: TSH well-controlled on labs from 7/30, continue levothyroxine 112 mcg daily.  3. Mixed hyperlipidemia: Reviewed cholesterol panel with the patient from February, recommend she start back on the Crestor 20 mg, will send to pharmacy.  Plan to recheck labs in February.  - rosuvastatin (CRESTOR) 20 MG tablet; Take 1 tablet (20 mg total) by mouth daily.  Dispense: 90 tablet; Refill: 1   Follow Up Instructions: Already scheduled for 9/30    I discussed the assessment and treatment plan with the patient. The patient was provided an opportunity to ask questions and all were answered. The patient agreed with the plan and demonstrated an understanding of the instructions.   The patient was advised to call back or seek an  in-person evaluation if the symptoms worsen or if the condition fails to improve as anticipated.  I provided 14 minutes of non-face-to-face time during this encounter.   [name], DO

## 2023-05-25 NOTE — Telephone Encounter (Signed)
Pt called regarding TSH results.  Pt thought it was indicating that she is pregnant.   [name], DO 05/17/2023  8:01 AM EDT     Thyroid function normal, continue current dose of Levothyroxine.    Patient Communication

## 2023-05-29 ENCOUNTER — Encounter: Payer: Self-pay | Admitting: Emergency Medicine

## 2023-05-31 ENCOUNTER — Encounter: Payer: Self-pay | Admitting: Orthopedic Surgery

## 2023-05-31 ENCOUNTER — Ambulatory Visit: Payer: Medicare Other | Admitting: Orthopedic Surgery

## 2023-05-31 VITALS — BP 130/76 | Ht 62.5 in | Wt 164.8 lb

## 2023-05-31 DIAGNOSIS — M4726 Other spondylosis with radiculopathy, lumbar region: Secondary | ICD-10-CM | POA: Diagnosis not present

## 2023-05-31 DIAGNOSIS — M5137 Other intervertebral disc degeneration, lumbosacral region: Secondary | ICD-10-CM

## 2023-05-31 DIAGNOSIS — M47816 Spondylosis without myelopathy or radiculopathy, lumbar region: Secondary | ICD-10-CM

## 2023-05-31 DIAGNOSIS — H538 Other visual disturbances: Secondary | ICD-10-CM

## 2023-05-31 DIAGNOSIS — R42 Dizziness and giddiness: Secondary | ICD-10-CM | POA: Diagnosis not present

## 2023-05-31 DIAGNOSIS — M5136 Other intervertebral disc degeneration, lumbar region: Secondary | ICD-10-CM | POA: Diagnosis not present

## 2023-05-31 DIAGNOSIS — M5416 Radiculopathy, lumbar region: Secondary | ICD-10-CM

## 2023-05-31 NOTE — Patient Instructions (Signed)
It was so nice to see you today. Thank you so much for coming in.    Your lower back xrays showed some wear and tear and I think this may be causing your pain.   I want to get an MRI of your lower back to look into things further. We will get this approved through your insurance and [facility] will call you to schedule the appointment.   [facility] (building with the white pillars) is located off of [address]. The address is [address] [place], [place] [address].   After you have the MRI, it takes 5-7 days for me to get the results back. Once I have them, we will call you to schedule a follow up visit with me to review them.   I recommend following up with an eye doctor (ophthalmologist) regarding the floaters in your eyes.   Please do not hesitate to call if you have any questions or concerns. You can also message me in MyChart.   If you have not heard back about any of the MRI in the next week, please call the office so we can help you get it scheduled.   Drake Leach PA-C [phone]

## 2023-06-01 ENCOUNTER — Ambulatory Visit (INDEPENDENT_AMBULATORY_CARE_PROVIDER_SITE_OTHER): Payer: Medicare Other | Admitting: Gastroenterology

## 2023-06-01 ENCOUNTER — Encounter: Payer: Self-pay | Admitting: Gastroenterology

## 2023-06-01 ENCOUNTER — Ambulatory Visit
Admission: RE | Admit: 2023-06-01 | Discharge: 2023-06-01 | Disposition: A | Discharge status: Home / Self Care | Payer: Medicare Other | Source: Ambulatory Visit | Attending: Orthopedic Surgery | Admitting: Orthopedic Surgery

## 2023-06-01 VITALS — BP 136/89 | HR 60 | Temp 98.2°F | Ht 62.0 in | Wt 164.0 lb

## 2023-06-01 DIAGNOSIS — M5416 Radiculopathy, lumbar region: Secondary | ICD-10-CM | POA: Diagnosis present

## 2023-06-01 DIAGNOSIS — Z808 Family history of malignant neoplasm of other organs or systems: Secondary | ICD-10-CM | POA: Diagnosis not present

## 2023-06-01 DIAGNOSIS — M5136 Other intervertebral disc degeneration, lumbar region: Secondary | ICD-10-CM | POA: Insufficient documentation

## 2023-06-01 DIAGNOSIS — R12 Heartburn: Secondary | ICD-10-CM

## 2023-06-01 DIAGNOSIS — K219 Gastro-esophageal reflux disease without esophagitis: Secondary | ICD-10-CM

## 2023-06-01 DIAGNOSIS — M47816 Spondylosis without myelopathy or radiculopathy, lumbar region: Secondary | ICD-10-CM | POA: Insufficient documentation

## 2023-06-01 DIAGNOSIS — B182 Chronic viral hepatitis C: Secondary | ICD-10-CM

## 2023-06-01 MED ORDER — PANTOPRAZOLE SODIUM 40 MG PO TBEC: 40.0000 mg | DELAYED_RELEASE_TABLET | Freq: Every day | ORAL | 1 refills | Status: DC
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
## 2023-06-05 LAB — HCV RNA QUANT
HCV log10: 5.294 {Log_IU}/mL
Hepatitis C Quantitation: 197000 [IU]/mL

## 2023-06-05 LAB — HEPATITIS A ANTIBODY, TOTAL: hep A Total Ab: NEGATIVE

## 2023-06-05 LAB — HEPATITIS B SURFACE ANTIGEN: Hepatitis B Surface Ag: NEGATIVE

## 2023-06-05 LAB — HEPATITIS B CORE ANTIBODY, TOTAL: Hep B Core Total Ab: NEGATIVE

## 2023-06-05 LAB — HEPATITIS C GENOTYPE

## 2023-06-08 NOTE — Addendum Note (Signed)
Addended by: Roena Malady on: 06/08/2023 10:47 AM   Modules accepted: Orders

## 2023-06-11 ENCOUNTER — Other Ambulatory Visit: Payer: Self-pay | Admitting: Family Medicine

## 2023-06-11 DIAGNOSIS — G8929 Other chronic pain: Secondary | ICD-10-CM

## 2023-06-11 DIAGNOSIS — M5432 Sciatica, left side: Secondary | ICD-10-CM

## 2023-06-14 LAB — HCV FIBROSURE
ALPHA 2-MACROGLOBULINS, QN: 350 mg/dL — ABNORMAL HIGH (ref 110–276)
ALT (SGPT) P5P: 24 IU/L (ref 0–40)
Apolipoprotein A-1: 136 mg/dL (ref 116–209)
Bilirubin, Total: 0.2 mg/dL (ref 0.0–1.2)
Fibrosis Score: 0.24 — ABNORMAL HIGH (ref 0.00–0.21)
GGT: 39 IU/L (ref 0–60)
Haptoglobin: 216 mg/dL (ref 33–346)
Necroinflammat Activity Score: 0.1 (ref 0.00–0.17)

## 2023-06-14 LAB — HEPATITIS B SURFACE ANTIBODY,QUALITATIVE: Hep B Surface Ab, Qual: NONREACTIVE

## 2023-06-23 ENCOUNTER — Other Ambulatory Visit: Payer: Self-pay | Admitting: Gastroenterology
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
## 2023-06-26 ENCOUNTER — Ambulatory Visit (INDEPENDENT_AMBULATORY_CARE_PROVIDER_SITE_OTHER): Payer: Medicare Other | Admitting: Orthopedic Surgery

## 2023-06-26 ENCOUNTER — Encounter: Payer: Self-pay | Admitting: Orthopedic Surgery

## 2023-06-26 VITALS — BP 130/82 | Ht 62.0 in | Wt 164.0 lb

## 2023-06-26 DIAGNOSIS — M4726 Other spondylosis with radiculopathy, lumbar region: Secondary | ICD-10-CM

## 2023-06-26 DIAGNOSIS — M48061 Spinal stenosis, lumbar region without neurogenic claudication: Secondary | ICD-10-CM

## 2023-06-26 DIAGNOSIS — M5136 Other intervertebral disc degeneration, lumbar region: Secondary | ICD-10-CM

## 2023-06-26 DIAGNOSIS — M47816 Spondylosis without myelopathy or radiculopathy, lumbar region: Secondary | ICD-10-CM

## 2023-06-26 DIAGNOSIS — M5416 Radiculopathy, lumbar region: Secondary | ICD-10-CM

## 2023-06-26 NOTE — Patient Instructions (Signed)
It was so nice to see you today. Thank you so much for coming in.    You have wear and tear in your back at L5-S1 and I think this is causing your back and leg pain. Not sure what is causing your right groin pain.   Keep your appointment with PMR for injection on 07/10/23.   Let me know if you want to start physical therapy for your back.   We have a phone visit scheduled. Please do not hesitate to call if you have any questions or concerns. You can also message me in MyChart.   Drake Leach PA-C [phone]

## 2023-07-06 ENCOUNTER — Ambulatory Visit: Payer: Medicaid Other | Admitting: Internal Medicine

## 2023-07-17 ENCOUNTER — Ambulatory Visit: Payer: Medicare Other | Admitting: Internal Medicine
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
## 2023-07-18 ENCOUNTER — Encounter: Payer: Self-pay | Admitting: Internal Medicine

## 2023-07-18 ENCOUNTER — Ambulatory Visit
Admission: RE | Admit: 2023-07-18 | Discharge: 2023-07-18 | Disposition: A | Discharge status: Home / Self Care | Payer: Medicare Other | Source: Ambulatory Visit | Attending: Internal Medicine | Admitting: Internal Medicine

## 2023-07-18 ENCOUNTER — Ambulatory Visit
Admission: RE | Admit: 2023-07-18 | Discharge: 2023-07-18 | Disposition: A | Discharge status: Home / Self Care | Payer: Medicare Other | Attending: Internal Medicine | Admitting: Internal Medicine

## 2023-07-18 ENCOUNTER — Ambulatory Visit (INDEPENDENT_AMBULATORY_CARE_PROVIDER_SITE_OTHER): Payer: Medicare Other | Admitting: Internal Medicine

## 2023-07-18 VITALS — BP 116/64 | HR 76 | Temp 98.2°F | Resp 18 | Ht 62.5 in | Wt 164.5 lb

## 2023-07-18 DIAGNOSIS — E039 Hypothyroidism, unspecified: Secondary | ICD-10-CM

## 2023-07-18 DIAGNOSIS — M79641 Pain in right hand: Secondary | ICD-10-CM

## 2023-07-18 DIAGNOSIS — J449 Chronic obstructive pulmonary disease, unspecified: Secondary | ICD-10-CM | POA: Diagnosis not present

## 2023-07-18 DIAGNOSIS — E782 Mixed hyperlipidemia: Secondary | ICD-10-CM | POA: Diagnosis not present

## 2023-07-18 DIAGNOSIS — Z122 Encounter for screening for malignant neoplasm of respiratory organs: Secondary | ICD-10-CM

## 2023-07-20 ENCOUNTER — Telehealth: Payer: Self-pay

## 2023-07-20 NOTE — Telephone Encounter (Signed)
Dawn Kane 23-Jul-1971 is approved thru 10/06/2023. Patient has $11.20 copay so will be reaching out to set up shipment shortly

## 2023-07-21 ENCOUNTER — Ambulatory Visit: Payer: Self-pay | Admitting: *Deleted

## 2023-07-21 ENCOUNTER — Telehealth: Payer: Self-pay | Admitting: Internal Medicine

## 2023-07-21 ENCOUNTER — Other Ambulatory Visit: Payer: Self-pay | Admitting: Internal Medicine

## 2023-07-21 ENCOUNTER — Encounter: Payer: Self-pay | Admitting: Gastroenterology

## 2023-07-21 DIAGNOSIS — E782 Mixed hyperlipidemia: Secondary | ICD-10-CM

## 2023-07-21 MED ORDER — ROSUVASTATIN CALCIUM 10 MG PO TABS: 10.0000 mg | ORAL_TABLET | Freq: Every day | ORAL | 0 refills | Status: DC

## 2023-07-21 NOTE — Telephone Encounter (Signed)
Summary: medication change   Patient called in stated her GI provider Dr Midge Minium said she needs to decrease the dosage for her rosuvastatin (CRESTOR) 20 MG tablet to 10mg  for the next 12 weeks due other medications that have been prescribed to her. Patient is requesting a new script to be sent into [id number], [place] - [address] Phone: [phone] Fax: [phone]       Reason for Disposition . [1] Caller has URGENT medicine question about med that PCP or specialist prescribed AND [2] triager unable to answer question  Answer Assessment - Initial Assessment Questions 1. NAME of MEDICINE: "What medicine(s) are you calling about?"     Rosuvastatin- needs to decrease dosing due to another prescribed medication- see notes 2. QUESTION: "What is your question?" (e.g., double dose of medicine, side effect)     Can PCP send new short term Rx [facility] 12 week therapy  3. PRESCRIBER: "Who prescribed the medicine?" Reason: if prescribed by specialist, call should be referred to that group.     PCP  GI notes: Good morning,   This is the information I went over with you over the phone.   1-Start taking Pantoprazole at 5pm daily, and you will take Epclusa (hepatitis treatment medication) in the morning   2- You can only take the maximum of Crestor 10mg  daily due to the contraindications with the treatment medication. I will send Dr Caralee Ates a message regarding this.   3- You need to hold taking your multivitamin and any supplement with iron, calcium or magnesium until after your treatment.     [facility] should be calling you soon to confirm your order. If they have not called you by next week Mon-Tues, please call them at [phone] so that they can mail the medication out to you.      Please let me know if you have any  additional questions.     Best regards,   Melanie,CMA for Dr Servando Snare  Protocols used: Medication Question Call-A-AH

## 2023-07-21 NOTE — Telephone Encounter (Signed)
Heather, pharmacist with [facility] stated that pt will need to hold multivitamin due to the decrease in efficacy of Epclusa if taken together... Also, max dose of Crestor is 10mg  daily due to the increased risk of myalgia and muscle break down... Additionally, Pt will need to take PPI more than 4 hours from [place]...   Pt advised to start PPI in the evening before dinner and take Epclusa in the AM... Msg sent to PCP regarding the temporary dose change for Crestor for the duration of Epclusa treatment...  Pt expressed understanding and Mychart msg sent to pt so that she can have to refer to later as a reminder.

## 2023-07-21 NOTE — Telephone Encounter (Signed)
  Chief Complaint: Medication request- patient needs short term decreased dose of rosuvastatin  Disposition: [] ED /[] Urgent Care (no appt availability in office) / [] Appointment(In office/virtual)/ []  [place] [facility]/ [] Home Care/ [] Refused Recommended Disposition /[] [place] [facility]/ [x]  Follow-up with PCP Additional Notes: Patient is requesting short term Rx of rosuvastatin 10mg  -see triage note. PCP should also be getting message from GI provider

## 2023-07-21 NOTE — Telephone Encounter (Signed)
Heather called in from [facility] about an medication. About these two medication Omeprazole 20 MG, Crestor 20 MG

## 2023-07-28 ENCOUNTER — Telehealth: Payer: Self-pay | Admitting: Internal Medicine

## 2023-07-28 NOTE — Telephone Encounter (Signed)
Pt is calling for results of DG Hand Complete Right (Accession [id number]) (Order [id number]) . Please advise CB- [phone]

## 2023-07-28 NOTE — Telephone Encounter (Signed)
Told patient radiologist are behind on scans will contact as soon as we get results

## 2023-08-17 NOTE — Progress Notes (Signed)
   Telephone Visit- Progress Note: Referring Physician:  [name], DO [address] [place],  [place] [address]  Primary Physician:  [name], DO  This visit was performed via telephone.  Patient location: home Provider location: office  I spent a total of 12 minutes non-face-to-face activities for this visit on the date of this encounter including review of current clinical condition and response to treatment.    Patient has given verbal consent to this telephone visits and we reviewed the limitations of a telephone visit. Patient wishes to proceed.    Chief Complaint:  follow up  History of Present Illness: Dawn Kane is a 52 y.o. female has a history of COPD, GERD, chronic hep C, hypothyroidism, and anxiety.    History of chronic back pain- has been on disability since 2021.   She has known lumbar spondylosis with mild DDD and mild central stenosis at L3-L4. 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    LBP and leg pain are mostly likely from L5-S1. Do not see cause of right groin pain. She has no pain with IR/ER of her right hip.   We discussed PT and she declined. She had bilateral S1 TF ESI on 07/10/23 with no relief. PMR started her on ultram.   She saw neurology and had EMG done on 08/15/23 Malvin Johns), results not back yet. She was to increase lyrica to 75mg  bid.   Phone visit scheduled for follow up.   She continues with constant LBP with bilateral lateral leg pain to her ankles. She has some tingling in her legs with weakness. No numbness in her legs. Pain is worse in the morning with lots of stiffness.    Feels like lyrica is making her anxiety worse. She has seen some improvement with ultram from PMR. She stopped mobic due to renal concerns. She takes zanaflex rarely as it "knocks her out."   Bowel/Bladder Dysfunction: none   Conservative measures:  Physical therapy: none Multimodal medical  therapy including regular antiinflammatories: mobic, neurontin, zanaflex, ultram, lyrica Injections:  bilateral S1 TF ESI on 07/10/23   Past Surgery: No surgery on her back.    Exam: No exam done as this was a telephone encounter.     Imaging: none  Assessment and Plan: [name] had no relief with lumbar ESI. She continues with constant LBP with bilateral lateral leg pain to her ankles. She has some tingling in her legs with weakness. No numbness in her legs.    She has known lumbar spondylosis with mild DDD and mild central stenosis at L3-L4. 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    LBP and leg pain are mostly likely from L5-S1.    Treatment options discussed with patient and following plan made:    - PT for lumbar spine. Orders to [facility] in [place].  - Stop zanaflex. New prescription for robaxin. Reviewed dosing and side effects. This can make her sleepy.  - Continue prn ultram from PMR.  - Follow up with neurology about lyrica. She feels like it makes her anxiety worse.  - Will regroup once I have EMG results. She may need to see surgeon if no improvement with PT.   Drake Leach PA-C Neurosurgery

## 2023-08-18 ENCOUNTER — Ambulatory Visit (INDEPENDENT_AMBULATORY_CARE_PROVIDER_SITE_OTHER): Payer: Medicare Other | Admitting: Orthopedic Surgery

## 2023-08-18 ENCOUNTER — Encounter: Payer: Self-pay | Admitting: Orthopedic Surgery

## 2023-08-18 DIAGNOSIS — M4726 Other spondylosis with radiculopathy, lumbar region: Secondary | ICD-10-CM

## 2023-08-18 DIAGNOSIS — M47816 Spondylosis without myelopathy or radiculopathy, lumbar region: Secondary | ICD-10-CM

## 2023-08-18 DIAGNOSIS — M5416 Radiculopathy, lumbar region: Secondary | ICD-10-CM

## 2023-08-18 DIAGNOSIS — M51362 Other intervertebral disc degeneration, lumbar region with discogenic back pain and lower extremity pain: Secondary | ICD-10-CM | POA: Diagnosis not present

## 2023-08-18 MED ORDER — METHOCARBAMOL 500 MG PO TABS: 500.0000 mg | ORAL_TABLET | Freq: Three times a day (TID) | ORAL | 0 refills | Status: DC | PRN

## 2023-08-23 ENCOUNTER — Encounter: Payer: Self-pay | Admitting: Orthopedic Surgery

## 2023-08-23 NOTE — Progress Notes (Signed)
Received paperwork from [facility] that robaxin is not on their formulary. They filled a 30 day temporary supply but would not continue to fill unless a formulary exception is obtained.   Forms scanned into chart. Will need to discuss with her if she needs a refill.

## 2023-08-24 ENCOUNTER — Telehealth: Payer: Self-pay | Admitting: Internal Medicine

## 2023-08-24 ENCOUNTER — Other Ambulatory Visit: Payer: Self-pay | Admitting: *Deleted

## 2023-08-24 DIAGNOSIS — Z87891 Personal history of nicotine dependence: Secondary | ICD-10-CM

## 2023-08-24 DIAGNOSIS — Z122 Encounter for screening for malignant neoplasm of respiratory organs: Secondary | ICD-10-CM

## 2023-08-24 NOTE — Telephone Encounter (Signed)
Pt is calling in because she had an Xray of her hand and they didn't find anything. Pt says the meds haven't been working and she believes she needs an MRI for her entire right arm due to the pain being all the way up to her shoulder. Pt is requesting a call back when the referral has been put in.

## 2023-08-29 ENCOUNTER — Ambulatory Visit: Payer: Medicare Other | Admitting: Acute Care

## 2023-08-29 ENCOUNTER — Ambulatory Visit: Payer: Self-pay

## 2023-08-29 ENCOUNTER — Encounter: Payer: Self-pay | Admitting: Acute Care

## 2023-08-29 DIAGNOSIS — F17210 Nicotine dependence, cigarettes, uncomplicated: Secondary | ICD-10-CM

## 2023-08-29 NOTE — Patient Instructions (Signed)

## 2023-08-29 NOTE — Telephone Encounter (Signed)
Pt called following up on request for MRI, please advise. Says she is in extreme pain, also experiencing numbness. Cannot grab anything, cannot do hardly anything with her right hand.   Transferred to NT

## 2023-08-29 NOTE — Progress Notes (Signed)
Virtual Visit via Telephone Note  I connected with Dawn Kane on 08/29/23 at  3:30 PM EST by telephone and verified that I am speaking with the correct person using two identifiers.  Location: Patient:  At home Provider: [address], [place], [place], [address]    I discussed the limitations, risks, security and privacy concerns of performing an evaluation and management service by telephone and the availability of in person appointments. I also discussed with the patient that there may be a patient responsible charge related to this service. The patient expressed understanding and agreed to proceed.   Shared Decision Making Visit Lung Cancer Screening Program [phone])   Eligibility: Age 67 y.o. Pack Years Smoking History Calculation 57 pack year smoking history (# packs/per year x # years smoked) Recent History of coughing up blood  no Unexplained weight loss? no ( >Than 15 pounds within the last 6 months ) Prior History Lung / other cancer no (Diagnosis within the last 5 years already requiring surveillance chest CT Scans). Smoking Status Current Smoker Former Smokers: Years since quit:  NA  Quit Date:  NA  Visit Components: Discussion included one or more decision making aids. yes Discussion included risk/benefits of screening. yes Discussion included potential follow up diagnostic testing for abnormal scans. yes Discussion included meaning and risk of over diagnosis. yes Discussion included meaning and risk of False Positives. yes Discussion included meaning of total radiation exposure. yes  Counseling Included: Importance of adherence to annual lung cancer LDCT screening. yes Impact of comorbidities on ability to participate in the program. yes Ability and willingness to under diagnostic treatment. yes  Smoking Cessation Counseling: Current Smokers:  Discussed importance of smoking cessation. yes Information about tobacco cessation classes and  interventions provided to patient. yes Patient provided with "ticket" for LDCT Scan. yes Symptomatic Patient. no  Counseling NA Diagnosis Code: Tobacco Use Z72.0 Asymptomatic Patient yes  Counseling (Intermediate counseling: > three minutes counseling) U1324 Former Smokers:  Discussed the importance of maintaining cigarette abstinence. yes Diagnosis Code: Personal History of Nicotine Dependence. M01.027 Information about tobacco cessation classes and interventions provided to patient. Yes Patient provided with "ticket" for LDCT Scan. yes Written Order for Lung Cancer Screening with LDCT placed in Epic. Yes (CT Chest Lung Cancer Screening Low Dose W/O CM) [id number] Z12.2-Screening of respiratory organs Z87.891-Personal history of nicotine dependence    [name], NP 08/29/2023

## 2023-08-29 NOTE — Telephone Encounter (Signed)
  Chief Complaint: Right hand and arm pain - some numbness Symptoms: above Frequency: 3 years Pertinent Negatives: Patient denies  Disposition: [] ED /[] Urgent Care (no appt availability in office) / [x] Appointment(In office/virtual)/ []  [place] [facility]/ [] Home Care/ [] Refused Recommended Disposition /[] [place] [facility]/ []  Follow-up with PCP Additional Notes: Pt states that 3 years ago while holding a dog leash the dog pulled hard and pain began. Pt states that pain continues and is getting worse. Pt states that right hand is fairly useless.  Reason for Disposition  [1] MODERATE pain (e.g., interferes with normal activities) AND [2] present > 3 days  Answer Assessment - Initial Assessment Questions 1. ONSET: "When did the pain start?"     3 years ago 2. LOCATION: "Where is the pain located?"     Right hand up to shoulder 3. PAIN: "How bad is the pain?" (Scale 1-10; or mild, moderate, severe)   - MILD (1-3): Doesn't interfere with normal activities.   - MODERATE (4-7): Interferes with normal activities (e.g., work or school) or awakens from sleep.   - SEVERE (8-10): Excruciating pain, unable to do any normal activities, unable to hold a cup of water.     Severe 4. WORK OR EXERCISE: "Has there been any recent work or exercise that involved this part of the body?"     Was pulled by dog on a leash 5. CAUSE: "What do you think is causing the arm pain?"     Torn something 6. OTHER SYMPTOMS: "Do you have any other symptoms?" (e.g., neck pain, swelling, rash, fever, numbness, weakness)     Numbness, weakness  Protocols used: Arm Pain-A-AH

## 2023-08-30 ENCOUNTER — Other Ambulatory Visit: Payer: Self-pay

## 2023-08-30 ENCOUNTER — Encounter: Payer: Self-pay | Admitting: Orthopedic Surgery

## 2023-08-30 ENCOUNTER — Ambulatory Visit
Admission: RE | Admit: 2023-08-30 | Discharge: 2023-08-30 | Disposition: A | Discharge status: Home / Self Care | Payer: Medicare Other | Source: Ambulatory Visit | Attending: Acute Care | Admitting: Acute Care

## 2023-08-30 ENCOUNTER — Ambulatory Visit (INDEPENDENT_AMBULATORY_CARE_PROVIDER_SITE_OTHER): Payer: Medicare Other | Admitting: Nurse Practitioner

## 2023-08-30 ENCOUNTER — Encounter: Payer: Self-pay | Admitting: Nurse Practitioner

## 2023-08-30 VITALS — BP 124/72 | HR 78 | Temp 97.9°F | Resp 16 | Ht 62.5 in | Wt 166.8 lb

## 2023-08-30 DIAGNOSIS — Z122 Encounter for screening for malignant neoplasm of respiratory organs: Secondary | ICD-10-CM | POA: Diagnosis present

## 2023-08-30 DIAGNOSIS — M51362 Other intervertebral disc degeneration, lumbar region with discogenic back pain and lower extremity pain: Secondary | ICD-10-CM

## 2023-08-30 DIAGNOSIS — Z87891 Personal history of nicotine dependence: Secondary | ICD-10-CM | POA: Insufficient documentation

## 2023-08-30 DIAGNOSIS — M79641 Pain in right hand: Secondary | ICD-10-CM | POA: Diagnosis not present

## 2023-08-30 DIAGNOSIS — M47816 Spondylosis without myelopathy or radiculopathy, lumbar region: Secondary | ICD-10-CM

## 2023-08-30 DIAGNOSIS — M5416 Radiculopathy, lumbar region: Secondary | ICD-10-CM

## 2023-08-30 NOTE — Progress Notes (Signed)
BP 124/72   Pulse 78   Temp 97.9 F (36.6 C) (Oral)   Resp 16   Ht 5' 2.5" (1.588 m)   Wt 166 lb 12.8 oz (75.7 kg)   LMP 06/17/2016 Comment: neg preg test  SpO2 98%   BMI 30.02 kg/m    Subjective:    Patient ID: Dawn Kane, female    DOB: 08/08/1971, 52 y.o.   MRN: [id number]  HPI: Dawn Kane is a 52 y.o. female  Chief Complaint  Patient presents with   Hand Pain    Right hand numbness that radiates to shoulder. Had previous hand injury 3 years ago   Right hand pain: patient reports she had a previous hand injury three years ago. She says most recently she has had right hand pain that radiates up to her shoulder. She was seen on 07/18/2023 by Dr. Caralee Ates for same complaint.  She had reported that it was exacerbated when she was walking a dog and it pulled the leash hard.  She says that she has tried NSAIDs but that it ihas not helped. She has had trouble gripping times and is dropping things.  She says she feels like something feels ripped.  She did have an xray of her had which showed : [facility] joint degenerative change. No acute osseous abnormalities.  Patient requests an MRI.  Recommend orthopedic evaluation.  Referral placed. She is scheduled for a nerve test with neurology.    Relevant past medical, surgical, family and social history reviewed and updated as indicated. Interim medical history since our last visit reviewed. Allergies and medications reviewed and updated.  Review of Systems  Ten systems reviewed and is negative except as mentioned in HPI       Objective:    BP 124/72   Pulse 78   Temp 97.9 F (36.6 C) (Oral)   Resp 16   Ht 5' 2.5" (1.588 m)   Wt 166 lb 12.8 oz (75.7 kg)   LMP 06/17/2016 Comment: neg preg test  SpO2 98%   BMI 30.02 kg/m   Wt Readings from Last 3 Encounters:  08/30/23 166 lb 12.8 oz (75.7 kg)  07/18/23 164 lb 8 oz (74.6 kg)  06/26/23 164 lb (74.4 kg)    Physical Exam  Constitutional: Patient appears well-developed  and well-nourished.  No distress.  HEENT: head atraumatic, normocephalic, pupils equal and reactive to light, neck supple Cardiovascular: Normal rate, regular rhythm and normal heart sounds.  No murmur heard. No BLE edema. Pulmonary/Chest: Effort normal and breath sounds normal. No respiratory distress. Abdominal: Soft.  There is no tenderness. Right hand: tenderness noted Psychiatric: Patient has a normal mood and affect. behavior is normal. Judgment and thought content normal.  Results for orders placed or performed in visit on 06/01/23  Hepatitis A antibody, total  Result Value Ref Range   hep A Total Ab Negative Negative  Hepatitis B core antibody, total  Result Value Ref Range   Hep B Core Total Ab Negative Negative  Hepatitis B surface antigen  Result Value Ref Range   Hepatitis B Surface Ag Negative Negative  Hepatitis C genotype  Result Value Ref Range   Hepatitis C Genotype 1a   HCV RNA quant  Result Value Ref Range   Hepatitis C Quantitation 197,000 IU/mL   HCV log10 5.294 log10 IU/mL   Test Information Comment   HCV FibroSURE  Result Value Ref Range   Fibrosis Score 0.24 (H) 0.00 - 0.21  Fibrosis Stage F0-F1    Necroinflammat Activity Score 0.10 0.00 - 0.17   Necroinflammat Activity Grade A0-No activity    Methodology: Comment    ALPHA 2-MACROGLOBULINS, QN 350 (H) 110 - 276 mg/dL   Haptoglobin 409 33 - 346 mg/dL   Apolipoprotein A-1 811 116 - 209 mg/dL   Bilirubin, Total 0.2 0.0 - 1.2 mg/dL   GGT 39 0 - 60 IU/L   ALT (SGPT) P5P 24 0 - 40 IU/L   Interpretations: Comment    Fibrosis Scoring: Comment    Necroinflamm Activity Scoring: Comment    Limitations: Comment    Comment Comment   Hepatitis B surface antibody,qualitative  Result Value Ref Range   Hep B Surface Ab, Qual Non Reactive       Assessment & Plan:   Problem List Items Addressed This Visit   None Visit Diagnoses     Right hand pain    -  Primary   referral placed to [facility]   Relevant  Orders   Ambulatory referral to Orthopedic Surgery        Follow up plan: Return if symptoms worsen or fail to improve.

## 2023-08-30 NOTE — Telephone Encounter (Signed)
Copied from CRM [id number]. Topic: Referral - Status >> Aug 30, 2023 12:31 PM Shon Hale wrote: Reason for CRM: Patient called back to check on status of referral. Patient had spoken to [facility] and was told they did not have a chart for her. Informed patient referral is being processed. Call dropped. Attempted to reach back out to patient - unable to reach left message.

## 2023-08-30 NOTE — Telephone Encounter (Addendum)
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   They are doing labs and started her on neurontin. EMG of upper extremities ordered.   LBP is likely lumbar mediated. Leg pain appears to be more due to polyneuropathy.   Recommend she start PT for lower back and f/u with me in 6-8 weeks. Message sent to patient.

## 2023-09-06 NOTE — Telephone Encounter (Signed)
Alana, from [facility] is calling to request the patient's PT referral be sent to their office. There is not a referral in the patient's chart. Please advise.   Fax: [phone]

## 2023-09-06 NOTE — Addendum Note (Signed)
Addended by: Sharlot Gowda on: 09/06/2023 02:46 PM   Modules accepted: Orders

## 2023-09-25 ENCOUNTER — Other Ambulatory Visit: Payer: Self-pay

## 2023-09-25 DIAGNOSIS — Z87891 Personal history of nicotine dependence: Secondary | ICD-10-CM

## 2023-09-25 DIAGNOSIS — F17210 Nicotine dependence, cigarettes, uncomplicated: Secondary | ICD-10-CM

## 2023-09-25 DIAGNOSIS — Z122 Encounter for screening for malignant neoplasm of respiratory organs: Secondary | ICD-10-CM

## 2023-09-26 ENCOUNTER — Ambulatory Visit: Payer: Medicare Other | Admitting: Internal Medicine

## 2023-09-26 ENCOUNTER — Ambulatory Visit: Payer: Self-pay

## 2023-09-26 NOTE — Telephone Encounter (Signed)
Chief Complaint: SOB Symptoms: mild to moderate SOB, itching, headache  Frequency: comes and goes Pertinent Negatives: Patient denies chest pain, fever, nausea, vomiting  Disposition: [] ED /[] Urgent Care (no appt availability in office) / [x] Appointment(In office/virtual)/ []  [place] Virtual [facility]/ [] Home Care/ [] Refused Recommended Disposition /[] [place] [facility]/ []  Follow-up with PCP Additional Notes: Patient states she has been experiencing mild to moderate SOB for a few weeks now that feels different from her usual COPD flare-ups. Patient states she gets sick around the same time every year. Patient states she is not sure if she is allergy to her cats or if the mold in her walls are causing her to have these symptoms. Patient states she has tried taking some over the counter medication but symptoms still have not improved yet. Care advice was given and patient has been scheduled to be seen in office tomorrow at 1120.   Reason for Disposition  [1] Longstanding difficulty breathing AND [2] not responding to usual therapy  Answer Assessment - Initial Assessment Questions 1. RESPIRATORY STATUS: "Describe your breathing?" (e.g., wheezing, shortness of breath, unable to speak, severe coughing)      SOB 2. ONSET: "When did this breathing problem begin?"      A few weeks ago  3. PATTERN "Does the difficult breathing come and go, or has it been constant since it started?"      Comes and goes  4. SEVERITY: "How bad is your breathing?" (e.g., mild, moderate, severe)    - MILD: No SOB at rest, mild SOB with walking, speaks normally in sentences, can lie down, no retractions, pulse < 100.    - MODERATE: SOB at rest, SOB with minimal exertion and prefers to sit, cannot lie down flat, speaks in phrases, mild retractions, audible wheezing, pulse 100-120.    - SEVERE: Very SOB at rest, speaks in single words, struggling to breathe, sitting hunched forward, retractions, pulse > 120      Mild to  Moderate  5. RECURRENT SYMPTOM: "Have you had difficulty breathing before?" If Yes, ask: "When was the last time?" and "What happened that time?"      Yes, I have COPD  6. CARDIAC HISTORY: "Do you have any history of heart disease?" (e.g., heart attack, angina, bypass surgery, angioplasty)      N/A 7. LUNG HISTORY: "Do you have any history of lung disease?"  (e.g., pulmonary embolus, asthma, emphysema)     COPD 8. CAUSE: "What do you think is causing the breathing problem?"      I get sick around this time every year 9. OTHER SYMPTOMS: "Do you have any other symptoms? (e.g., dizziness, runny nose, cough, chest pain, fever)     Headache, itching  Protocols used: Breathing Difficulty-A-AH

## 2023-09-27 ENCOUNTER — Encounter: Payer: Self-pay | Admitting: Physician Assistant

## 2023-09-27 ENCOUNTER — Ambulatory Visit (INDEPENDENT_AMBULATORY_CARE_PROVIDER_SITE_OTHER): Payer: Medicare Other | Admitting: Physician Assistant

## 2023-09-27 VITALS — BP 118/74 | HR 78 | Resp 16 | Ht 62.0 in | Wt 167.0 lb

## 2023-09-27 DIAGNOSIS — J449 Chronic obstructive pulmonary disease, unspecified: Secondary | ICD-10-CM | POA: Diagnosis not present

## 2023-09-27 DIAGNOSIS — J310 Chronic rhinitis: Secondary | ICD-10-CM

## 2023-09-27 MED ORDER — BREZTRI AEROSPHERE 160-9-4.8 MCG/ACT IN AERO: 2.0000 | INHALATION_SPRAY | Freq: Two times a day (BID) | RESPIRATORY_TRACT | 2 refills | Status: DC

## 2023-09-27 NOTE — Progress Notes (Signed)
Acute Office Visit   Patient: Dawn Kane   DOB: 1971/08/24   52 y.o. Female  MRN: [id number] Visit Date: 09/27/2023  Today's healthcare provider: Oswaldo Conroy Afomia Blackley, PA-C  Introduced myself to the patient as a Secondary school teacher and provided education on APPs in clinical practice.    Chief Complaint  Patient presents with   Body Itching    On/off "for years"   Nasal Congestion    Taking OTC Mucus Relief   Headache   Subjective    HPI HPI     Body Itching    Additional comments: On/off "for years"        Nasal Congestion    Additional comments: Taking OTC Mucus Relief      Last edited by Dollene Primrose, CMA on 09/27/2023 11:34 AM.       Itching and nasal congestion   She reports she is having sinus pressure, cough, congestion, sore throat, body aches, itching    She is taking Mucus relief OTC medication daily  She reports symptoms have been ongoing for a few years and seems to get worse in the winter months   She is using her inhalers States she is using her albuterol inhaler and nebulizer  She is using nebulizer a few times per month. Using inhaler as needed - a few times per week    Medications: Outpatient Medications Prior to Visit  Medication Sig   albuterol (VENTOLIN HFA) 108 (90 Base) MCG/ACT inhaler Inhale 1-2 puffs into the lungs every 6 (six) hours as needed for wheezing or shortness of breath.   EPCLUSA 400-100 MG TABS Take 1 tablet by mouth daily.   gabapentin (NEURONTIN) 100 MG capsule Take 100 mg by mouth 3 (three) times daily.   ipratropium-albuterol (DUONEB) 0.5-2.5 (3) MG/3ML SOLN Take 3 mLs by nebulization every 4 (four) hours as needed.   levothyroxine (SYNTHROID) 112 MCG tablet TAKE 1 TABLET BY MOUTH EVERY DAY   loratadine (CLARITIN) 10 MG tablet Take 1 tablet (10 mg total) by mouth daily. (Patient taking differently: Take 10 mg by mouth as needed.)   methocarbamol (ROBAXIN) 500 MG tablet Take 1 tablet (500 mg total) by mouth every 8 (eight)  hours as needed for muscle spasms.   Multiple Vitamins-Minerals (CENTRUM WOMEN) TABS Take 1 tablet by mouth 3 (three) times a week.   pantoprazole (PROTONIX) 40 MG tablet TAKE 1 TABLET BY MOUTH EVERY DAY   rosuvastatin (CRESTOR) 10 MG tablet Take 1 tablet (10 mg total) by mouth daily.   simethicone (MYLICON) 80 MG chewable tablet Chew 80 mg by mouth every 6 (six) hours as needed for flatulence.   pregabalin (LYRICA) 25 MG capsule TAKE 1 CAPSULE BY MOUTH 2 TIMES DAILY. (Patient not taking: Reported on 08/30/2023)   No facility-administered medications prior to visit.    Review of Systems  Constitutional:  Negative for chills, fatigue and fever.  HENT:  Positive for congestion, rhinorrhea and sore throat.   Respiratory:  Positive for cough and shortness of breath.   Musculoskeletal:  Positive for myalgias.  Skin:        Itching         Objective    BP 118/74   Pulse 78   Resp 16   Ht 5\' 2"  (1.575 m)   Wt 167 lb (75.8 kg)   LMP 06/17/2016 Comment: neg preg test  SpO2 94%   BMI 30.54 kg/m     Physical Exam Vitals reviewed.  Constitutional:      General: She is awake.     Appearance: Normal appearance. She is well-developed and well-groomed.  HENT:     Head: Normocephalic and atraumatic.  Eyes:     General: Lids are normal. Gaze aligned appropriately. Allergic shiner present.     Extraocular Movements: Extraocular movements intact.     Conjunctiva/sclera: Conjunctivae normal.  Cardiovascular:     Rate and Rhythm: Normal rate and regular rhythm.     Heart sounds: Normal heart sounds. No murmur heard.    No friction rub. No gallop.  Pulmonary:     Effort: Pulmonary effort is normal.     Breath sounds: Decreased air movement present. Decreased breath sounds present. No wheezing, rhonchi or rales.  Musculoskeletal:     Cervical back: Normal range of motion and neck supple.  Lymphadenopathy:     Head:     Right side of head: No submental, submandibular or preauricular  adenopathy.     Left side of head: No submental, submandibular or preauricular adenopathy.     Cervical:     Right cervical: No superficial cervical adenopathy.    Left cervical: No superficial cervical adenopathy.     Upper Body:     Right upper body: No supraclavicular adenopathy.     Left upper body: No supraclavicular adenopathy.  Neurological:     Mental Status: She is alert.  Psychiatric:        Behavior: Behavior is cooperative.       No results found for any visits on 09/27/23.  Assessment & Plan      No follow-ups on file.      Problem List Items Addressed This Visit       Respiratory   Chronic obstructive pulmonary disease (HCC)   Chronic, historic condition Suspect this is not well-controlled given her inconsistency with using inhalers. Will add Breztri 2 medication regimen and reviewed use of rescue inhaler and nebulizer treatments. I have reviewed using over-the-counter Mucinex medication with her.  Recommend switching to daily second-generation antihistamine along with Flonase to achieve congestion resolution. Follow-up as needed for progressing or persistent symptoms      Relevant Medications   Budeson-Glycopyrrol-Formoterol (BREZTRI AEROSPHERE) 160-9-4.8 MCG/ACT AERO   Chronic rhinitis - Primary   Chronic, ongoing Patient has been using over-the-counter Mucinex formulations to assist with symptoms. We had an extensive discussion regarding allergies, pathophysiology of allergic rhinitis, congestion, COPD.  I recommend switching her regimen to a daily second-generation antihistamine, Flonase, appropriate use of inhalers and nebulizer. Will also place referral to allergy services for testing and assistance with controlling symptoms Will add Breztri to daily regimen to improve breathing. Follow-up as needed for progressing or persistent symptoms      Relevant Orders   Ambulatory referral to Allergy     No follow-ups on file.   I, Tramain Gershman E Minsa Weddington, PA-C,  have reviewed all documentation for this visit. The documentation on 09/29/23 for the exam, diagnosis, procedures, and orders are all accurate and complete.   Jacquelin Hawking, MHS, PA-C [facility] [address] [facility]

## 2023-09-27 NOTE — Patient Instructions (Addendum)
I also recommend adding an antihistamine to your daily regimen This includes medications like  Allegra, Zyrtec- the generics of these work very well and are usually less expensive I recommend using Flonase nasal spray - 2 puffs twice per day to help with your nasal congestion The antihistamines and Flonase can take a few weeks to provide significant relief from allergy symptoms but should start to provide some benefit soon.  I am sending a new inhaler for you to take twice per day, called Breztri   Please use this as directed as this will help with your breathing   You should receive a phone call to schedule your apt with the Allergy specialist sometime in the next week. If you do not get a call in 2 weeks please call our office

## 2023-09-29 ENCOUNTER — Other Ambulatory Visit: Payer: Self-pay | Admitting: Family

## 2023-09-29 DIAGNOSIS — J310 Chronic rhinitis: Secondary | ICD-10-CM | POA: Insufficient documentation

## 2023-09-29 NOTE — Assessment & Plan Note (Signed)
Chronic, historic condition Suspect this is not well-controlled given her inconsistency with using inhalers. Will add Breztri 2 medication regimen and reviewed use of rescue inhaler and nebulizer treatments. I have reviewed using over-the-counter Mucinex medication with her.  Recommend switching to daily second-generation antihistamine along with Flonase to achieve congestion resolution. Follow-up as needed for progressing or persistent symptoms

## 2023-09-29 NOTE — Assessment & Plan Note (Signed)
Chronic, ongoing Patient has been using over-the-counter Mucinex formulations to assist with symptoms. We had an extensive discussion regarding allergies, pathophysiology of allergic rhinitis, congestion, COPD.  I recommend switching her regimen to a daily second-generation antihistamine, Flonase, appropriate use of inhalers and nebulizer. Will also place referral to allergy services for testing and assistance with controlling symptoms Will add Breztri to daily regimen to improve breathing. Follow-up as needed for progressing or persistent symptoms

## 2023-10-05 ENCOUNTER — Other Ambulatory Visit: Payer: Self-pay | Admitting: Internal Medicine

## 2023-10-05 ENCOUNTER — Other Ambulatory Visit: Payer: Self-pay | Admitting: Gastroenterology

## 2023-10-05 ENCOUNTER — Other Ambulatory Visit: Payer: Self-pay | Admitting: Orthopedic Surgery

## 2023-10-05 DIAGNOSIS — E782 Mixed hyperlipidemia: Secondary | ICD-10-CM

## 2023-10-05 NOTE — Telephone Encounter (Signed)
Requested Prescriptions  Pending Prescriptions Disp Refills   rosuvastatin (CRESTOR) 10 MG tablet [Pharmacy Med Name: ROSUVASTATIN CALCIUM 10 MG TAB] 90 tablet 0    Sig: TAKE 1 TABLET BY MOUTH EVERY DAY     Cardiovascular:  Antilipid - Statins 2 Failed - 10/05/2023  1:15 PM      Failed - Cr in normal range and within 360 days    Creat  Date Value Ref Range Status  12/02/2022 0.90 0.50 - 1.03 mg/dL Final   Creatinine, Ser  Date Value Ref Range Status  03/06/2023 1.01 (H) 0.44 - 1.00 mg/dL Final         Failed - Lipid Panel in normal range within the last 12 months    Cholesterol  Date Value Ref Range Status  12/02/2022 291 (H) <200 mg/dL Final   LDL Cholesterol (Calc)  Date Value Ref Range Status  12/02/2022 203 (H) mg/dL (calc) Final    Comment:    LDL-C levels > or = 190 mg/dL may indicate familial  hypercholesterolemia (FH). Clinical assessment and  measurement of blood lipid levels should be  considered for all first degree relatives of patients with an FH diagnosis. LDL Cholesterol (LDL-C) levels > or = 300 mg/dL may indicate homozygous familial hypercholesterolemia (HoFH). Untreated,  these extremely high LDL-C levels can result in premature CV events and mortality. Patients should be identified early and provided appropriate interventions to reduce the cumulative LDL-C burden from birth. . For questions about testing for familial hypercholesterolemia, please call Engineer, materials at 1.866.GENE.INFO. Wardell Honour, et al. J National Lipid Association  Recommendations for Patient-Centered Management of Dyslipidemia: Part 1 Journal of  Clinical Lipidology 2015;9(2), 129-169. Cuchel, M. et al. (2014). Homozygous familial hypercholesterolaemia: new insights and guidance for clinicians to improve detection and clinical management. European Heart Journal, 35(32), [phone]. Reference range: <100 . Desirable range <100 mg/dL for primary prevention;    <70 mg/dL for patients with CHD or diabetic patients  with > or = 2 CHD risk factors. Marland Kitchen LDL-C is now calculated using the Martin-Hopkins  calculation, which is a validated novel method providing  better accuracy than the Friedewald equation in the  estimation of LDL-C.  Horald Pollen et al. Lenox Ahr. 4782;956(21): 2061-2068  (http://education.QuestDiagnostics.com/faq/FAQ164)    HDL  Date Value Ref Range Status  12/02/2022 43 (L) > OR = 50 mg/dL Final   Triglycerides  Date Value Ref Range Status  12/02/2022 245 (H) <150 mg/dL Final    Comment:    . If a non-fasting specimen was collected, consider repeat triglyceride testing on a fasting specimen if clinically indicated.  Perry Mount et al. J. of Clin. Lipidol. 2015;9:129-169. Marland Kitchen          Passed - Patient is not pregnant      Passed - Valid encounter within last 12 months    Recent Outpatient Visits           1 week ago Chronic rhinitis   [place] [facility] Mecum, Oswaldo Conroy, PA-C   1 month ago Right hand pain   [facility] Berniece Salines, FNP   2 months ago Right hand pain   [facility] [name], DO   4 months ago Chronic midline low back pain with left-sided sciatica   [facility] [name], DO   4 months ago Chronic midline low back pain with sciatica, sciatica laterality unspecified   [facility] Danelle Berry, PA-C       Future Appointments             In 1 month [name], DO [facility], [facility]
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
## 2023-10-19 ENCOUNTER — Encounter: Payer: Self-pay | Admitting: Orthopedic Surgery

## 2023-10-19 ENCOUNTER — Ambulatory Visit (INDEPENDENT_AMBULATORY_CARE_PROVIDER_SITE_OTHER): Payer: Medicare HMO | Admitting: Orthopedic Surgery

## 2023-10-19 VITALS — BP 136/78 | Ht 62.0 in | Wt 167.0 lb

## 2023-10-19 DIAGNOSIS — M4306 Spondylolysis, lumbar region: Secondary | ICD-10-CM | POA: Diagnosis not present

## 2023-10-19 DIAGNOSIS — M51362 Other intervertebral disc degeneration, lumbar region with discogenic back pain and lower extremity pain: Secondary | ICD-10-CM | POA: Diagnosis not present

## 2023-10-19 DIAGNOSIS — M799 Soft tissue disorder, unspecified: Secondary | ICD-10-CM | POA: Diagnosis not present

## 2023-10-19 DIAGNOSIS — M48061 Spinal stenosis, lumbar region without neurogenic claudication: Secondary | ICD-10-CM

## 2023-10-19 DIAGNOSIS — M79644 Pain in right finger(s): Secondary | ICD-10-CM | POA: Diagnosis not present

## 2023-10-19 DIAGNOSIS — M47816 Spondylosis without myelopathy or radiculopathy, lumbar region: Secondary | ICD-10-CM | POA: Diagnosis not present

## 2023-10-19 DIAGNOSIS — M25511 Pain in right shoulder: Secondary | ICD-10-CM | POA: Diagnosis not present

## 2023-10-19 DIAGNOSIS — M6281 Muscle weakness (generalized): Secondary | ICD-10-CM | POA: Diagnosis not present

## 2023-10-19 NOTE — Patient Instructions (Signed)
 It was so nice to see you today. Thank you so much for coming in.    You have wear and tear in your back at L5-S1 and I think this is causing your back and leg pain. Some of the pain in your legs is from the neuropathy as well.   Continue with physical therapy. Tell them you don't want any massage and that you prefer not to see that last PT you saw.   If no improvement, we may need to have you see one of the surgeons. I will see you back in 6 weeks. Please do not hesitate to call if you have any questions or concerns. You can also message me in MyChart.   Glade Boys PA-C [phone]

## 2023-10-26 DIAGNOSIS — M6281 Muscle weakness (generalized): Secondary | ICD-10-CM | POA: Diagnosis not present

## 2023-10-26 DIAGNOSIS — M47816 Spondylosis without myelopathy or radiculopathy, lumbar region: Secondary | ICD-10-CM | POA: Diagnosis not present

## 2023-10-26 DIAGNOSIS — M799 Soft tissue disorder, unspecified: Secondary | ICD-10-CM | POA: Diagnosis not present

## 2023-10-30 ENCOUNTER — Other Ambulatory Visit (HOSPITAL_BASED_OUTPATIENT_CLINIC_OR_DEPARTMENT_OTHER): Payer: Self-pay | Admitting: Orthopaedic Surgery

## 2023-10-30 ENCOUNTER — Ambulatory Visit (HOSPITAL_BASED_OUTPATIENT_CLINIC_OR_DEPARTMENT_OTHER): Payer: Medicare HMO

## 2023-10-30 DIAGNOSIS — G8929 Other chronic pain: Secondary | ICD-10-CM

## 2023-11-02 ENCOUNTER — Ambulatory Visit (INDEPENDENT_AMBULATORY_CARE_PROVIDER_SITE_OTHER): Payer: Medicare HMO | Admitting: Orthopaedic Surgery

## 2023-11-02 ENCOUNTER — Ambulatory Visit
Admission: RE | Admit: 2023-11-02 | Discharge: 2023-11-02 | Disposition: A | Discharge status: Home / Self Care | Payer: Medicare HMO | Attending: Orthopaedic Surgery | Admitting: Orthopaedic Surgery

## 2023-11-02 ENCOUNTER — Ambulatory Visit
Admission: RE | Admit: 2023-11-02 | Discharge: 2023-11-02 | Disposition: A | Discharge status: Home / Self Care | Payer: Medicare HMO | Source: Ambulatory Visit | Attending: Orthopaedic Surgery | Admitting: Orthopaedic Surgery

## 2023-11-02 DIAGNOSIS — M7521 Bicipital tendinitis, right shoulder: Secondary | ICD-10-CM | POA: Diagnosis not present

## 2023-11-02 DIAGNOSIS — S42021K Displaced fracture of shaft of right clavicle, subsequent encounter for fracture with nonunion: Secondary | ICD-10-CM | POA: Diagnosis not present

## 2023-11-02 DIAGNOSIS — G8929 Other chronic pain: Secondary | ICD-10-CM | POA: Diagnosis not present

## 2023-11-02 DIAGNOSIS — M19049 Primary osteoarthritis, unspecified hand: Secondary | ICD-10-CM | POA: Diagnosis not present

## 2023-11-02 DIAGNOSIS — F17210 Nicotine dependence, cigarettes, uncomplicated: Secondary | ICD-10-CM | POA: Diagnosis not present

## 2023-11-02 DIAGNOSIS — M25511 Pain in right shoulder: Secondary | ICD-10-CM | POA: Diagnosis not present

## 2023-11-02 DIAGNOSIS — M19041 Primary osteoarthritis, right hand: Secondary | ICD-10-CM

## 2023-11-02 DIAGNOSIS — H1045 Other chronic allergic conjunctivitis: Secondary | ICD-10-CM | POA: Diagnosis not present

## 2023-11-02 DIAGNOSIS — J449 Chronic obstructive pulmonary disease, unspecified: Secondary | ICD-10-CM | POA: Diagnosis not present

## 2023-11-02 DIAGNOSIS — J3089 Other allergic rhinitis: Secondary | ICD-10-CM | POA: Diagnosis not present

## 2023-11-02 DIAGNOSIS — M129 Arthropathy, unspecified: Secondary | ICD-10-CM | POA: Diagnosis not present
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
## 2023-11-03 DIAGNOSIS — M799 Soft tissue disorder, unspecified: Secondary | ICD-10-CM | POA: Diagnosis not present

## 2023-11-03 DIAGNOSIS — M47816 Spondylosis without myelopathy or radiculopathy, lumbar region: Secondary | ICD-10-CM | POA: Diagnosis not present

## 2023-11-03 DIAGNOSIS — M6281 Muscle weakness (generalized): Secondary | ICD-10-CM | POA: Diagnosis not present

## 2023-11-08 DIAGNOSIS — M7521 Bicipital tendinitis, right shoulder: Secondary | ICD-10-CM | POA: Diagnosis not present

## 2023-11-08 DIAGNOSIS — M19049 Primary osteoarthritis, unspecified hand: Secondary | ICD-10-CM | POA: Diagnosis not present

## 2023-11-08 MED ORDER — TRIAMCINOLONE ACETONIDE 40 MG/ML IJ SUSP
80.0000 mg | INTRAMUSCULAR | Status: AC | PRN
  Administered 2023-11-08: 80 mg via INTRA_ARTICULAR

## 2023-11-08 MED ORDER — LIDOCAINE HCL 1 % IJ SOLN
4.0000 mL | INTRAMUSCULAR | Status: AC | PRN
  Administered 2023-11-08: 4 mL

## 2023-11-10 DIAGNOSIS — M6281 Muscle weakness (generalized): Secondary | ICD-10-CM | POA: Diagnosis not present

## 2023-11-10 DIAGNOSIS — R519 Headache, unspecified: Secondary | ICD-10-CM | POA: Diagnosis not present

## 2023-11-10 DIAGNOSIS — R42 Dizziness and giddiness: Secondary | ICD-10-CM | POA: Diagnosis not present

## 2023-11-10 DIAGNOSIS — G629 Polyneuropathy, unspecified: Secondary | ICD-10-CM | POA: Diagnosis not present

## 2023-11-10 DIAGNOSIS — M79605 Pain in left leg: Secondary | ICD-10-CM | POA: Diagnosis not present

## 2023-11-10 DIAGNOSIS — M79604 Pain in right leg: Secondary | ICD-10-CM | POA: Diagnosis not present

## 2023-11-10 DIAGNOSIS — M542 Cervicalgia: Secondary | ICD-10-CM | POA: Diagnosis not present

## 2023-11-10 DIAGNOSIS — R200 Anesthesia of skin: Secondary | ICD-10-CM | POA: Diagnosis not present

## 2023-11-10 DIAGNOSIS — M545 Low back pain, unspecified: Secondary | ICD-10-CM | POA: Diagnosis not present

## 2023-11-10 DIAGNOSIS — M47816 Spondylosis without myelopathy or radiculopathy, lumbar region: Secondary | ICD-10-CM | POA: Diagnosis not present

## 2023-11-10 DIAGNOSIS — E538 Deficiency of other specified B group vitamins: Secondary | ICD-10-CM | POA: Diagnosis not present

## 2023-11-10 DIAGNOSIS — M799 Soft tissue disorder, unspecified: Secondary | ICD-10-CM | POA: Diagnosis not present

## 2023-11-13 DIAGNOSIS — M6281 Muscle weakness (generalized): Secondary | ICD-10-CM | POA: Diagnosis not present

## 2023-11-13 DIAGNOSIS — M799 Soft tissue disorder, unspecified: Secondary | ICD-10-CM | POA: Diagnosis not present

## 2023-11-13 DIAGNOSIS — M47816 Spondylosis without myelopathy or radiculopathy, lumbar region: Secondary | ICD-10-CM | POA: Diagnosis not present

## 2023-11-14 DIAGNOSIS — H43393 Other vitreous opacities, bilateral: Secondary | ICD-10-CM | POA: Diagnosis not present

## 2023-11-14 DIAGNOSIS — H524 Presbyopia: Secondary | ICD-10-CM | POA: Diagnosis not present

## 2023-11-14 DIAGNOSIS — H25012 Cortical age-related cataract, left eye: Secondary | ICD-10-CM | POA: Diagnosis not present

## 2023-11-14 DIAGNOSIS — H52223 Regular astigmatism, bilateral: Secondary | ICD-10-CM | POA: Diagnosis not present

## 2023-11-16 DIAGNOSIS — M799 Soft tissue disorder, unspecified: Secondary | ICD-10-CM | POA: Diagnosis not present

## 2023-11-16 DIAGNOSIS — M6281 Muscle weakness (generalized): Secondary | ICD-10-CM | POA: Diagnosis not present

## 2023-11-16 DIAGNOSIS — M47816 Spondylosis without myelopathy or radiculopathy, lumbar region: Secondary | ICD-10-CM | POA: Diagnosis not present

## 2023-11-17 DIAGNOSIS — E538 Deficiency of other specified B group vitamins: Secondary | ICD-10-CM | POA: Diagnosis not present

## 2023-11-20 DIAGNOSIS — H524 Presbyopia: Secondary | ICD-10-CM | POA: Diagnosis not present

## 2023-11-20 DIAGNOSIS — M799 Soft tissue disorder, unspecified: Secondary | ICD-10-CM | POA: Diagnosis not present

## 2023-11-20 DIAGNOSIS — M6281 Muscle weakness (generalized): Secondary | ICD-10-CM | POA: Diagnosis not present

## 2023-11-20 DIAGNOSIS — M47816 Spondylosis without myelopathy or radiculopathy, lumbar region: Secondary | ICD-10-CM | POA: Diagnosis not present

## 2023-11-21 ENCOUNTER — Encounter: Payer: Self-pay | Admitting: Internal Medicine

## 2023-11-21 ENCOUNTER — Ambulatory Visit (INDEPENDENT_AMBULATORY_CARE_PROVIDER_SITE_OTHER): Payer: Medicare HMO | Admitting: Internal Medicine

## 2023-11-21 ENCOUNTER — Other Ambulatory Visit: Payer: Self-pay

## 2023-11-21 VITALS — BP 120/74 | HR 85 | Temp 98.3°F | Resp 16 | Ht 62.0 in | Wt 170.4 lb

## 2023-11-21 DIAGNOSIS — B182 Chronic viral hepatitis C: Secondary | ICD-10-CM

## 2023-11-21 DIAGNOSIS — K219 Gastro-esophageal reflux disease without esophagitis: Secondary | ICD-10-CM | POA: Diagnosis not present

## 2023-11-21 DIAGNOSIS — J449 Chronic obstructive pulmonary disease, unspecified: Secondary | ICD-10-CM | POA: Diagnosis not present

## 2023-11-21 DIAGNOSIS — E782 Mixed hyperlipidemia: Secondary | ICD-10-CM | POA: Diagnosis not present

## 2023-11-21 DIAGNOSIS — E039 Hypothyroidism, unspecified: Secondary | ICD-10-CM | POA: Diagnosis not present

## 2023-11-21 DIAGNOSIS — R232 Flushing: Secondary | ICD-10-CM | POA: Diagnosis not present

## 2023-11-21 DIAGNOSIS — L739 Follicular disorder, unspecified: Secondary | ICD-10-CM

## 2023-11-21 MED ORDER — CLINDAMYCIN PHOSPHATE 1 % EX GEL: Freq: Two times a day (BID) | CUTANEOUS | 0 refills | Status: AC

## 2023-11-21 MED ORDER — OMEPRAZOLE 20 MG PO CPDR: 20.0000 mg | DELAYED_RELEASE_CAPSULE | Freq: Every day | ORAL | 3 refills | Status: DC

## 2023-11-21 NOTE — Assessment & Plan Note (Signed)
Not using a maintenance inhaler, using Albuterol a few times a week, not interested in a different maintenance inhaler at this time. Declines all vaccines despite risk.

## 2023-11-21 NOTE — Assessment & Plan Note (Signed)
Supposed to be on Epclusa but taking intermittently due to side effects, recommend discussing with GI.

## 2023-11-21 NOTE — Assessment & Plan Note (Signed)
Recheck lipid panel, LDL very elevated last year. Not taking statin consistently will most likely have to increase dose.

## 2023-11-21 NOTE — Assessment & Plan Note (Signed)
No longer on Protonix, will prescribe Prilosec 20 mg to take daily as this is currently controlling her symptoms.

## 2023-11-21 NOTE — Assessment & Plan Note (Signed)
Recheck labs, fill medication based on results.
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
## 2023-11-22 LAB — CBC WITH DIFFERENTIAL/PLATELET
Absolute Lymphocytes: 3472 {cells}/uL (ref 850–3900)
Absolute Monocytes: 894 {cells}/uL (ref 200–950)
Basophils Absolute: 89 {cells}/uL (ref 0–200)
Basophils Relative: 0.6 %
Eosinophils Absolute: 224 {cells}/uL (ref 15–500)
Eosinophils Relative: 1.5 %
HCT: 45.1 % — ABNORMAL HIGH (ref 35.0–45.0)
Hemoglobin: 14.5 g/dL (ref 11.7–15.5)
MCH: 29.1 pg (ref 27.0–33.0)
MCHC: 32.2 g/dL (ref 32.0–36.0)
MCV: 90.6 fL (ref 80.0–100.0)
MPV: 10.1 fL (ref 7.5–12.5)
Monocytes Relative: 6 %
Neutro Abs: 10221 {cells}/uL — ABNORMAL HIGH (ref 1500–7800)
Neutrophils Relative %: 68.6 %
Platelets: 399 10*3/uL (ref 140–400)
RBC: 4.98 10*6/uL (ref 3.80–5.10)
RDW: 13.9 % (ref 11.0–15.0)
Total Lymphocyte: 23.3 %
WBC: 14.9 10*3/uL — ABNORMAL HIGH (ref 3.8–10.8)

## 2023-11-22 LAB — COMPLETE METABOLIC PANEL WITH GFR
AG Ratio: 1.6 (calc) (ref 1.0–2.5)
ALT: 11 U/L (ref 6–29)
AST: 10 U/L (ref 10–35)
Albumin: 4.3 g/dL (ref 3.6–5.1)
Alkaline phosphatase (APISO): 78 U/L (ref 37–153)
BUN/Creatinine Ratio: 13 (calc) (ref 6–22)
BUN: 15 mg/dL (ref 7–25)
CO2: 28 mmol/L (ref 20–32)
Calcium: 10.4 mg/dL (ref 8.6–10.4)
Chloride: 102 mmol/L (ref 98–110)
Creat: 1.17 mg/dL — ABNORMAL HIGH (ref 0.50–1.03)
Globulin: 2.7 g/dL (ref 1.9–3.7)
Glucose, Bld: 99 mg/dL (ref 65–99)
Potassium: 4.6 mmol/L (ref 3.5–5.3)
Sodium: 139 mmol/L (ref 135–146)
Total Bilirubin: 0.2 mg/dL (ref 0.2–1.2)
Total Protein: 7 g/dL (ref 6.1–8.1)
eGFR: 56 mL/min/{1.73_m2} — ABNORMAL LOW (ref >=60)

## 2023-11-22 LAB — TSH: TSH: 4.59 m[IU]/L — ABNORMAL HIGH

## 2023-11-22 LAB — LIPID PANEL
Cholesterol: 295 mg/dL — ABNORMAL HIGH (ref <200)
HDL: 58 mg/dL (ref >=50)
LDL Cholesterol (Calc): 196 mg/dL — ABNORMAL HIGH
Non-HDL Cholesterol (Calc): 237 mg/dL — ABNORMAL HIGH (ref <130)
Total CHOL/HDL Ratio: 5.1 (calc) — ABNORMAL HIGH (ref <5.0)
Triglycerides: 224 mg/dL — ABNORMAL HIGH (ref <150)

## 2023-11-22 MED ORDER — ROSUVASTATIN CALCIUM 40 MG PO TABS: 40.0000 mg | ORAL_TABLET | Freq: Every day | ORAL | 3 refills | Status: AC

## 2023-11-22 MED ORDER — LEVOTHYROXINE SODIUM 112 MCG PO TABS: 112.0000 ug | ORAL_TABLET | Freq: Every day | ORAL | 1 refills | Status: DC

## 2023-11-22 NOTE — Addendum Note (Signed)
 Addended by: Rockney Cid on: 11/22/2023 02:13 PM   Modules accepted: Orders

## 2023-11-23 ENCOUNTER — Other Ambulatory Visit: Payer: Self-pay | Admitting: Internal Medicine

## 2023-11-23 DIAGNOSIS — K219 Gastro-esophageal reflux disease without esophagitis: Secondary | ICD-10-CM

## 2023-11-29 ENCOUNTER — Encounter: Payer: Medicare HMO | Admitting: Orthopedic Surgery
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
## 2023-12-02 ENCOUNTER — Other Ambulatory Visit: Payer: Self-pay | Admitting: Internal Medicine

## 2023-12-02 DIAGNOSIS — J449 Chronic obstructive pulmonary disease, unspecified: Secondary | ICD-10-CM

## 2023-12-04 ENCOUNTER — Ambulatory Visit
Admission: RE | Admit: 2023-12-04 | Discharge: 2023-12-04 | Disposition: A | Discharge status: Home / Self Care | Payer: Medicare HMO | Attending: Orthopedic Surgery | Admitting: Orthopedic Surgery

## 2023-12-04 ENCOUNTER — Ambulatory Visit (INDEPENDENT_AMBULATORY_CARE_PROVIDER_SITE_OTHER): Payer: Medicare HMO | Admitting: Orthopedic Surgery

## 2023-12-04 ENCOUNTER — Encounter: Payer: Self-pay | Admitting: Orthopedic Surgery

## 2023-12-04 ENCOUNTER — Ambulatory Visit
Admission: RE | Admit: 2023-12-04 | Discharge: 2023-12-04 | Disposition: A | Discharge status: Home / Self Care | Payer: Medicare HMO | Source: Ambulatory Visit | Attending: Orthopedic Surgery | Admitting: Orthopedic Surgery

## 2023-12-04 VITALS — BP 132/82 | Ht 62.0 in | Wt 172.0 lb

## 2023-12-04 DIAGNOSIS — M47816 Spondylosis without myelopathy or radiculopathy, lumbar region: Secondary | ICD-10-CM | POA: Insufficient documentation

## 2023-12-04 DIAGNOSIS — M5416 Radiculopathy, lumbar region: Secondary | ICD-10-CM | POA: Insufficient documentation

## 2023-12-04 DIAGNOSIS — M48061 Spinal stenosis, lumbar region without neurogenic claudication: Secondary | ICD-10-CM

## 2023-12-04 DIAGNOSIS — M4807 Spinal stenosis, lumbosacral region: Secondary | ICD-10-CM | POA: Diagnosis not present

## 2023-12-04 DIAGNOSIS — M51362 Other intervertebral disc degeneration, lumbar region with discogenic back pain and lower extremity pain: Secondary | ICD-10-CM | POA: Insufficient documentation

## 2023-12-04 DIAGNOSIS — M4726 Other spondylosis with radiculopathy, lumbar region: Secondary | ICD-10-CM

## 2023-12-04 DIAGNOSIS — M5117 Intervertebral disc disorders with radiculopathy, lumbosacral region: Secondary | ICD-10-CM | POA: Diagnosis not present

## 2023-12-04 DIAGNOSIS — M545 Low back pain, unspecified: Secondary | ICD-10-CM | POA: Diagnosis not present

## 2023-12-04 DIAGNOSIS — M79606 Pain in leg, unspecified: Secondary | ICD-10-CM | POA: Diagnosis not present

## 2023-12-04 NOTE — Telephone Encounter (Signed)
Requested Prescriptions  Pending Prescriptions Disp Refills   albuterol (VENTOLIN HFA) 108 (90 Base) MCG/ACT inhaler [Pharmacy Med Name: ALBUTEROL HFA (VENTOLIN) INH] 18 each 2    Sig: INHALE 1-2 PUFFS BY MOUTH EVERY 6 HOURS AS NEEDED FOR WHEEZE OR SHORTNESS OF BREATH     Pulmonology:  [facility] Agonists 2 Passed - 12/04/2023 12:50 PM      Passed - Last BP in normal range    BP Readings from Last 1 Encounters:  12/04/23 132/82         Passed - Last Heart Rate in normal range    Pulse Readings from Last 1 Encounters:  11/21/23 85         Passed - Valid encounter within last 12 months    Recent Outpatient Visits           2 months ago Chronic rhinitis    [facility] Mecum, Oswaldo Conroy, PA-C   3 months ago Right hand pain   [facility] Berniece Salines, FNP   4 months ago Right hand pain   [facility] [name], DO   6 months ago Chronic midline low back pain with left-sided sciatica   [facility] [name], DO   6 months ago Chronic midline low back pain with sciatica, sciatica laterality unspecified   [facility] Danelle Berry, PA-C       Future Appointments             In 5 months [name], DO [facility]
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
## 2023-12-13 ENCOUNTER — Encounter: Payer: Self-pay | Admitting: Neurosurgery

## 2023-12-13 ENCOUNTER — Telehealth: Payer: Self-pay

## 2023-12-13 ENCOUNTER — Ambulatory Visit (INDEPENDENT_AMBULATORY_CARE_PROVIDER_SITE_OTHER): Payer: Medicare HMO | Admitting: Neurosurgery

## 2023-12-13 VITALS — BP 124/70 | Ht 61.0 in | Wt 174.0 lb

## 2023-12-13 DIAGNOSIS — M4726 Other spondylosis with radiculopathy, lumbar region: Secondary | ICD-10-CM

## 2023-12-13 DIAGNOSIS — M47816 Spondylosis without myelopathy or radiculopathy, lumbar region: Secondary | ICD-10-CM

## 2023-12-13 DIAGNOSIS — M48062 Spinal stenosis, lumbar region with neurogenic claudication: Secondary | ICD-10-CM | POA: Diagnosis not present

## 2023-12-13 NOTE — Telephone Encounter (Signed)
 Dawn Kane, pharmacist with [facility] said they called to confirm that pt finished treatment... Pt told them that she started having headaches after starting Epclusa, so she never completed the treatment...   Please advise

## 2023-12-20 NOTE — Telephone Encounter (Signed)
 I spoke to pt and she states she took about 4-5 weeks of medication and also took Tylenol and ibuprofen with no relief of headaches and reports the headaches actually worsened over time...   Please advise

## 2023-12-22 ENCOUNTER — Telehealth (INDEPENDENT_AMBULATORY_CARE_PROVIDER_SITE_OTHER): Admitting: Internal Medicine

## 2023-12-22 ENCOUNTER — Ambulatory Visit: Payer: Self-pay | Admitting: Internal Medicine

## 2023-12-22 ENCOUNTER — Other Ambulatory Visit: Payer: Self-pay

## 2023-12-22 DIAGNOSIS — D72829 Elevated white blood cell count, unspecified: Secondary | ICD-10-CM

## 2023-12-22 DIAGNOSIS — R61 Generalized hyperhidrosis: Secondary | ICD-10-CM | POA: Diagnosis not present

## 2023-12-22 DIAGNOSIS — M5432 Sciatica, left side: Secondary | ICD-10-CM

## 2023-12-22 NOTE — Progress Notes (Signed)
 Virtual Visit via Video Note  I connected with Dawn Kane on 12/22/23 at  3:40 PM EST by a video enabled telemedicine application and verified that I am speaking with the correct person using two identifiers.  Location: Patient: Home Provider: [facility]   I discussed the limitations of evaluation and management by telemedicine and the availability of in person appointments. The patient expressed understanding and agreed to proceed.  History of Present Illness:  Patient presenting via telemedicine to discuss night sweats.  We briefly discussed this at a previous visit when she was taking the nortriptyline.  It was recommended that she stop the nortriptyline which she did.  She did notice a slight improvement in symptoms with this change but is not completely resolved.  This is now been going on for about a month.  She sometimes will sweat during the day but will have to wake up at least once in the middle the night to change her close after sweating.  She does have hepatitis C and is currently not on treatment.  She is also complaining of some vaginal discomfort/urinary discomfort.  She did have an elevated WBC on her last labs 1 month ago.  She denies weight changes.   Observations/Objective:  General: well appearing, no acute distress ENT: conjunctiva normal appearing bilaterally  Skin: no rashes, cyanosis or abnormal bruising noted Neuro: Answering all questions appropriately  Assessment and Plan:  1. Night sweats (Primary)/Leukocytosis, unspecified type: I do think her night sweats are due to her ongoing hepatitis C infection, recommend she make an appointment with her GI doctor to discuss further.  She did have elevated white blood cell count 1 month ago and is complaining of urinary discomfort, will have her come into the office first thing on Monday to recheck a CBC and to check urine and vaginal swab.  - CBC w/Diff/Platelet  2. Sciatica of left side: She is already established with  neurosurgery but states she needs another referral placed for insurance purposes, referral placed today.  - Ambulatory referral to Neurosurgery   Follow Up Instructions: Already scheduled.    I discussed the assessment and treatment plan with the patient. The patient was provided an opportunity to ask questions and all were answered. The patient agreed with the plan and demonstrated an understanding of the instructions.   The patient was advised to call back or seek an in-person evaluation if the symptoms worsen or if the condition fails to improve as anticipated.  I provided 20 minutes of non-face-to-face time during this encounter.   [name], DO

## 2023-12-22 NOTE — Telephone Encounter (Signed)
  Chief Complaint: Night sweats - pt thinks it's from medication Symptoms: night sweats Frequency: since starting nortriptyline. Pertinent Negatives: Patient denies  Disposition: [] ED /[] Urgent Care (no appt availability in office) / [x] Appointment(In office/virtual)/ []  [facility]/ [] Home Care/ [] Refused Recommended Disposition /[] [facility]/ []  Follow-up with PCP Additional Notes: Pt states that since starting nortriptyline, her night sweats have increased. She discontinued this medication after last ov, however night sweats continue. MyChart VV scheduled for this afternoon.   Copied from CRM [id number]. Topic: Clinical - Medication Question >> Dec 22, 2023  8:50 AM Everette C wrote: Reason for CRM: The patient was directed to stop taking their nortriptyline (PAMELOR) 10 MG capsule [[id number]] to prevent further issues related to sleep discomfort and night sweats   The patient has stopped taking the medication but continues to experience night sweats and inability to sleep peacefully   Please contact the patient further when possible Reason for Disposition  [1] Caller has NON-URGENT medicine question about med that PCP prescribed AND [2] triager unable to answer question  Answer Assessment - Initial Assessment Questions 1. NAME of MEDICINE: "What medicine(s) are you calling about?"     Nortriptyline 2. QUESTION: "What is your question?" (e.g., double dose of medicine, side effect)     Pt is still experiencing night sweats. 3. PRESCRIBER: "Who prescribed the medicine?" Reason: if prescribed by specialist, call should be referred to that group.     Dr. Caralee Ates 4. SYMPTOMS: "Do you have any symptoms?" If Yes, ask: "What symptoms are you having?"  "How bad are the symptoms (e.g., mild, moderate, severe)     Night sweats  Protocols used: Medication Question Call-A-AH

## 2023-12-25 ENCOUNTER — Other Ambulatory Visit: Payer: Self-pay | Admitting: Emergency Medicine

## 2023-12-25 ENCOUNTER — Other Ambulatory Visit (HOSPITAL_COMMUNITY)
Admission: RE | Admit: 2023-12-25 | Discharge: 2023-12-25 | Disposition: A | Discharge status: Home / Self Care | Source: Ambulatory Visit | Attending: Internal Medicine | Admitting: Internal Medicine

## 2023-12-25 DIAGNOSIS — N898 Other specified noninflammatory disorders of vagina: Secondary | ICD-10-CM | POA: Insufficient documentation

## 2023-12-25 DIAGNOSIS — D72829 Elevated white blood cell count, unspecified: Secondary | ICD-10-CM | POA: Diagnosis not present

## 2023-12-25 LAB — CBC WITH DIFFERENTIAL/PLATELET
Absolute Lymphocytes: 3632 {cells}/uL (ref 850–3900)
Absolute Monocytes: 858 {cells}/uL (ref 200–950)
Basophils Absolute: 100 {cells}/uL (ref 0–200)
Basophils Relative: 0.7 %
Eosinophils Absolute: 400 {cells}/uL (ref 15–500)
Eosinophils Relative: 2.8 %
HCT: 44.4 % (ref 35.0–45.0)
Hemoglobin: 14.4 g/dL (ref 11.7–15.5)
MCH: 29.5 pg (ref 27.0–33.0)
MCHC: 32.4 g/dL (ref 32.0–36.0)
MCV: 91 fL (ref 80.0–100.0)
MPV: 11 fL (ref 7.5–12.5)
Monocytes Relative: 6 %
Neutro Abs: 9309 {cells}/uL — ABNORMAL HIGH (ref 1500–7800)
Neutrophils Relative %: 65.1 %
Platelets: 353 10*3/uL (ref 140–400)
RBC: 4.88 10*6/uL (ref 3.80–5.10)
RDW: 13.2 % (ref 11.0–15.0)
Total Lymphocyte: 25.4 %
WBC: 14.3 10*3/uL — ABNORMAL HIGH (ref 3.8–10.8)

## 2023-12-27 ENCOUNTER — Other Ambulatory Visit: Payer: Self-pay | Admitting: Internal Medicine

## 2023-12-27 ENCOUNTER — Telehealth: Payer: Self-pay

## 2023-12-27 DIAGNOSIS — B9689 Other specified bacterial agents as the cause of diseases classified elsewhere: Secondary | ICD-10-CM

## 2023-12-27 LAB — CERVICOVAGINAL ANCILLARY ONLY
Bacterial Vaginitis (gardnerella): POSITIVE — AB
Candida Glabrata: NEGATIVE
Candida Vaginitis: NEGATIVE
Chlamydia: NEGATIVE
Comment: NEGATIVE
Comment: NEGATIVE
Comment: NEGATIVE
Comment: NEGATIVE
Comment: NEGATIVE
Comment: NORMAL
Neisseria Gonorrhea: NEGATIVE
Trichomonas: NEGATIVE

## 2023-12-27 MED ORDER — METRONIDAZOLE 500 MG PO TABS
500.0000 mg | ORAL_TABLET | Freq: Two times a day (BID) | ORAL | 0 refills | Status: AC
Start: 2023-12-27 — End: 2024-01-03

## 2023-12-27 NOTE — Telephone Encounter (Unsigned)
 Copied from CRM [id number]. Topic: Clinical - Lab/Test Results >> Dec 27, 2023 12:06 PM Gery Pray wrote: Reason for CRM: Patient called in regards to lab results. Patient stated that she would like to speak to a nurse regarding results. Patient stated that the person that she spoke to about the results didn't explain anything and they just called her in a prescription. Patient would like to be contacted at [phone]

## 2023-12-28 NOTE — Telephone Encounter (Signed)
 Patient notified

## 2023-12-29 ENCOUNTER — Other Ambulatory Visit: Payer: Self-pay

## 2023-12-29 ENCOUNTER — Encounter: Payer: Self-pay | Admitting: Internal Medicine

## 2023-12-29 ENCOUNTER — Ambulatory Visit (INDEPENDENT_AMBULATORY_CARE_PROVIDER_SITE_OTHER): Admitting: Internal Medicine

## 2023-12-29 VITALS — BP 120/74 | HR 88 | Temp 98.0°F | Resp 16 | Ht 62.0 in | Wt 174.6 lb

## 2023-12-29 DIAGNOSIS — B372 Candidiasis of skin and nail: Secondary | ICD-10-CM

## 2023-12-29 DIAGNOSIS — L731 Pseudofolliculitis barbae: Secondary | ICD-10-CM | POA: Diagnosis not present

## 2023-12-29 MED ORDER — NYSTATIN 100000 UNIT/GM EX POWD: 1.0000 | Freq: Three times a day (TID) | CUTANEOUS | 0 refills | Status: AC

## 2023-12-29 NOTE — Progress Notes (Signed)
   Acute Office Visit  Subjective:     Patient ID: Dawn Kane, female    DOB: [birth year], 53 y.o.   MRN: [id number]  Chief Complaint  Patient presents with   Cyst    HPI Patient is in today for rash under breasts and in armpit area bilaterally. States it is worse after she gets out of a hot shower or when she is sweating. Using baby powder but does not seem to help much.   Still has the ingrown hair on the left side of her groin. Is still tender to palpation and red, no drainage. No fevers.   Review of Systems  Constitutional:  Negative for chills and fever.  Skin:  Positive for itching and rash.        Objective:    BP 120/74 (Cuff Size: Large)   Pulse 88   Temp 98 F (36.7 C) (Oral)   Resp 16   Ht 5\' 2"  (1.575 m)   Wt 174 lb 9.6 oz (79.2 kg)   LMP 06/17/2016 Comment: neg preg test  SpO2 98%   BMI 31.93 kg/m  BP Readings from Last 3 Encounters:  12/29/23 120/74  12/13/23 124/70  12/04/23 132/82   Wt Readings from Last 3 Encounters:  12/29/23 174 lb 9.6 oz (79.2 kg)  12/13/23 174 lb (78.9 kg)  12/04/23 172 lb (78 kg)      Physical Exam Constitutional:      Appearance: Normal appearance.  HENT:     Head: Normocephalic and atraumatic.  Eyes:     Conjunctiva/sclera: Conjunctivae normal.  Cardiovascular:     Rate and Rhythm: Normal rate and regular rhythm.  Pulmonary:     Effort: Pulmonary effort is normal.     Breath sounds: Normal breath sounds.  Skin:    General: Skin is warm and dry.     Findings: Rash present.     Comments: Yeast dermatitis under breasts and in bilateral axilla, ingrown hair unable to be expressed but tender and red  Neurological:     General: No focal deficit present.     Mental Status: She is alert. Mental status is at baseline.  Psychiatric:        Mood and Affect: Mood normal.        Behavior: Behavior normal.     No results found for any visits on 12/29/23.      Assessment & Plan:   1. Yeast dermatitis  (Primary): Discussed keeping the area dry and clean, will prescribe Nystatin powder.   - nystatin (MYCOSTATIN/NYSTOP) powder; Apply 1 Application topically 3 (three) times daily.  Dispense: 60 g; Refill: 0  2. Ingrown hair: Continue warm compresses, she was prescribe topical clindamycin previously which she has not been using but still has, will start applying this daily.   Return if symptoms worsen or fail to improve.  [name], DO

## 2024-01-02 ENCOUNTER — Telehealth: Payer: Self-pay

## 2024-01-02 NOTE — Telephone Encounter (Signed)
 Pt.notified

## 2024-01-02 NOTE — Telephone Encounter (Signed)
 Copied from CRM [id number]. Topic: Clinical - Prescription Issue >> Jan 02, 2024  3:22 PM Everette C wrote: Reason for CRM: The patient has called to share that their prescription for nystatin (MYCOSTATIN/NYSTOP) powder [[id number]] has been ineffective since receiving it on 12/29/23  The patient would like to discuss alternative options when possible. Please contact when available

## 2024-01-03 ENCOUNTER — Other Ambulatory Visit: Payer: Self-pay | Admitting: Gastroenterology

## 2024-03-12 NOTE — Progress Notes (Signed)
 "  BP 136/82 (BP Location: Left Arm, Patient Position: Sitting, Cuff Size: Normal)   Pulse 87   Temp 98.1 F (36.7 C) (Oral)   Ht 5' 2 (1.575 m)   Wt 171 lb 4.8 oz (77.7 kg)   LMP 06/17/2016 Comment: neg preg test  SpO2 95%   BMI 31.33 kg/m    Subjective:    Patient ID: Dawn Kane, female    DOB: 1971-06-17, 53 y.o.   MRN: [id number]  HPI: Dawn Kane is a 53 y.o. female  Chief Complaint  Patient presents with   redness    Some itching, soreness and swelling in crease of leg near vagina. Was seen for this about 2 months ago. Medication was not helping     Discussed the use of AI scribe software for clinical note transcription with the patient, who gave verbal consent to proceed.  History of Present Illness Dawn Kane is a 53 year old female who presents with itching and soreness in her groin area.  She has been experiencing itching and soreness in her groin area for approximately two months. Initially, she thought it was due to an ingrown hair, but the symptoms have persisted, and the area has turned purple. The area is sometimes painful and aching.  She was previously advised to use an ointment prescribed for her hand and a triple antibiotic ointment, but she has been inconsistent in her use and notes no improvement. She recalls being given clindamycin  gel in the past and mentions using nystatin  powder for a different issue under her arms.  She has a history of a bacterial infection in the same area, for which she was prescribed an antibiotic that upset her stomach but resolved the infection. However, this treatment did not affect her current condition.  Her medication history includes allergies to aspirin and amoxicillin , which cause stomach upset but are not severe. She occasionally takes allergy medications and expresses concern about the interaction of new antibiotics with her current medications, although she does not specify any additional medications beyond  those mentioned.         03/13/2024   10:17 AM 12/22/2023    3:44 PM 08/30/2023   11:47 AM  Depression screen PHQ 2/9  Decreased Interest 0 0 0  Down, Depressed, Hopeless 0 0 0  PHQ - 2 Score 0 0 0  Altered sleeping 0    Tired, decreased energy 0    Change in appetite 0    Feeling bad or failure about yourself  0    Trouble concentrating 0    Moving slowly or fidgety/restless 0    Suicidal thoughts 0    PHQ-9 Score 0    Difficult doing work/chores Not difficult at all      Relevant past medical, surgical, family and social history reviewed and updated as indicated. Interim medical history since our last visit reviewed. Allergies and medications reviewed and updated.  Review of Systems  Ten systems reviewed and is negative except as mentioned in HPI      Objective:      BP 136/82 (BP Location: Left Arm, Patient Position: Sitting, Cuff Size: Normal)   Pulse 87   Temp 98.1 F (36.7 C) (Oral)   Ht 5' 2 (1.575 m)   Wt 171 lb 4.8 oz (77.7 kg)   LMP 06/17/2016 Comment: neg preg test  SpO2 95%   BMI 31.33 kg/m    Wt Readings from Last 3 Encounters:  03/13/24 171 lb 4.8  oz (77.7 kg)  12/29/23 174 lb 9.6 oz (79.2 kg)  12/13/23 174 lb (78.9 kg)    Physical Exam Vitals reviewed.  Constitutional:      Appearance: Normal appearance.  HENT:     Head: Normocephalic.  Cardiovascular:     Rate and Rhythm: Normal rate.  Pulmonary:     Effort: Pulmonary effort is normal.  Skin:    Comments: Sebaceous cyst in left groin  Neurological:     General: No focal deficit present.     Mental Status: She is alert and oriented to person, place, and time. Mental status is at baseline.  Psychiatric:        Mood and Affect: Mood normal.        Behavior: Behavior normal.        Thought Content: Thought content normal.        Judgment: Judgment normal.        Results for orders placed or performed in visit on 12/25/23  Cervicovaginal ancillary only   Collection Time: 12/25/23   2:18 PM  Result Value Ref Range   Neisseria Gonorrhea Negative    Chlamydia Negative    Trichomonas Negative    Bacterial Vaginitis (gardnerella) Positive (A)    Candida Vaginitis Negative    Candida Glabrata Negative    Comment      Normal Reference Range Bacterial Vaginosis - Negative   Comment Normal Reference Range Candida Species - Negative    Comment Normal Reference Range Candida Galbrata - Negative    Comment Normal Reference Range Trichomonas - Negative    Comment Normal Reference Ranger Chlamydia - Negative    Comment      Normal Reference Range Neisseria Gonorrhea - Negative          Assessment & Plan:   Problem List Items Addressed This Visit   None Visit Diagnoses       Sebaceous cyst    -  Primary   Relevant Orders   Ambulatory referral to General Surgery        Assessment and Plan Assessment & Plan Cyst in left groin Chronic cyst in the left groin, initially suspected as an ingrown hair. The lesion persists despite topical treatments with clindamycin  gel and triple antibiotic ointment. It is occasionally painful and unresponsive to previous antibiotic therapy for bacterial infection. Differential diagnosis includes hidradenitis suppurativa. Surgical excision is recommended to ensure complete removal and prevent recurrence. Popping the cyst is not advised as it may not remove the entire sac, leading to recurrence. - Prescribe oral antibiotics to reduce inflammation and infection. - Advise that surgical excision may be necessary for complete removal and to prevent recurrence. - Instruct to await a call for scheduling the surgical procedure.        Follow up plan: Return if symptoms worsen or fail to improve.      "

## 2024-03-13 ENCOUNTER — Ambulatory Visit (INDEPENDENT_AMBULATORY_CARE_PROVIDER_SITE_OTHER): Admitting: Nurse Practitioner

## 2024-03-13 VITALS — BP 136/82 | HR 87 | Temp 98.1°F | Ht 62.0 in | Wt 171.3 lb

## 2024-03-13 DIAGNOSIS — L723 Sebaceous cyst: Secondary | ICD-10-CM

## 2024-03-18 ENCOUNTER — Encounter: Payer: Self-pay | Admitting: Surgery

## 2024-03-18 ENCOUNTER — Ambulatory Visit (INDEPENDENT_AMBULATORY_CARE_PROVIDER_SITE_OTHER): Admitting: Surgery

## 2024-03-18 VITALS — BP 143/85 | HR 76 | Ht 62.0 in | Wt 170.0 lb

## 2024-03-18 DIAGNOSIS — L720 Epidermal cyst: Secondary | ICD-10-CM

## 2024-03-18 NOTE — Patient Instructions (Signed)
 We will schedule you for excision of this area in the office. You may drive yourself but may have someone with you for this.   Pocket of Fluid in the Skin (Epidermoid Cyst): What to Know  An epidermoid cyst is a pocket of fluid that can form under your skin. It's filled with thick, oily substance that your skin glands make. These cysts occur anywhere on your body. They're usually harmless and don't cause problems unless they get inflamed or infected. What are the causes? An epidermoid cyst may be caused by: A blocked pore or hair follicle. An ingrown hair. This is a hair that curls and re-enters the skin instead of growing straight out of the skin. Skin irritation. Skin injuries. Some conditions that are passed from parent to child (inherited). Human papillomavirus (HPV). This is rare but can cause cysts on the bottom of the feet. Long-term (chronic) sun damage to the skin. What increases the risk? Having acne. Being female. Skin injuries. Being past puberty. Certain rare genetic disorders. What are the signs or symptoms? The only sign of this type of cyst may be a small, painless lump under the skin. When an epidermal cyst ruptures, it may become inflamed. Infections are rare, but symptoms may include: Redness. Inflammation. Tenderness. Warmth. Fever. A bad-smelling, grayish-white substance draining from the cyst. Pus draining from the cyst. How is this diagnosed? This condition is diagnosed with a physical exam. Sometimes, a tissue sample (biopsy) may need to be looked at under a microscope or tested for bacteria. You may be referred to a health care provider who specializes in skin care. This provider is called a dermatologist. How is this treated? If a cyst becomes inflamed, treatment may include: Opening and draining the cyst. Antibiotics. Steroid shots to lessen inflammation. Surgery to take out cysts that are large, painful, or could turn into cancer. Do not try to open or  squeeze a cyst yourself. Follow these instructions at home: Medicines Take your medicines only as told. If you were given antibiotics, take them as told. Do not stop taking them even if you start to feel better. General instructions Keep the area around your cyst clean and dry. Wear loose, dry clothing. Avoid touching your cyst. Check the area around your cyst every day for signs of infection. Check for: Redness, swelling, or pain. Fluid or blood. Warmth. Pus or a bad smell. Keep all follow-up visits to make sure the cyst isn't becoming uncomfortable or infected. Contact a health care provider if: You have any signs of infection. Your cyst doesn't get better or gets worse. You get a cyst that looks different from other cysts you've had. You have a fever. You have redness that spreads from the cyst. This information is not intended to replace advice given to you by your health care provider. Make sure you discuss any questions you have with your health care provider. Document Revised: 05/29/2023 Document Reviewed: 05/19/2023 Elsevier Patient Education  2024 [employer].
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
## 2024-03-19 ENCOUNTER — Ambulatory Visit: Admitting: General Surgery

## 2024-03-25 ENCOUNTER — Ambulatory Visit (INDEPENDENT_AMBULATORY_CARE_PROVIDER_SITE_OTHER): Admitting: Surgery

## 2024-03-25 ENCOUNTER — Encounter: Payer: Self-pay | Admitting: Surgery

## 2024-03-25 ENCOUNTER — Other Ambulatory Visit: Payer: Self-pay | Admitting: Internal Medicine

## 2024-03-25 VITALS — BP 132/92 | HR 90 | Temp 99.1°F | Ht 62.0 in | Wt 170.2 lb

## 2024-03-25 DIAGNOSIS — L720 Epidermal cyst: Secondary | ICD-10-CM

## 2024-03-25 DIAGNOSIS — J449 Chronic obstructive pulmonary disease, unspecified: Secondary | ICD-10-CM

## 2024-03-25 DIAGNOSIS — K219 Gastro-esophageal reflux disease without esophagitis: Secondary | ICD-10-CM

## 2024-03-25 NOTE — Patient Instructions (Addendum)
 We have removed a Cyst in our office today.  You have sutures under the skin that will dissolve and also dermabond (skin glue) on top of your skin which will come off on it's own in 10-14 days.  You may use Ibuprofen  or Tylenol  as needed for pain control. Use the ice pack 3-4 times a day for the next two days for any achiness.  You may shower 03/27/2024. Do not scrub at the area.   Avoid Strenuous activities that will make you sweat during the next 48 hours to avoid the glue coming off prematurely. Avoid activities that will place pressure to this area of the body for 1-2 weeks to avoid re-injury to incision site.  Please see your follow-up appointment provided. We will see you back in office to make sure this area is healed and to review the final pathology. If you have any questions or concerns prior to this appointment, call our office and speak with a nurse.    Excision of Skin Cysts or Lesions Excision of a skin lesion refers to the removal of a section of skin by making small cuts (incisions) in the skin. This procedure may be done to remove a cancerous (malignant) or noncancerous (benign) growth on the skin. It is typically done to treat or prevent cancer or infection. It may also be done to improve cosmetic appearance. The procedure may be done to remove: Cancerous growths, such as basal cell carcinoma, squamous cell carcinoma, or melanoma. Noncancerous growths, such as a cyst or lipoma. Growths, such as moles or skin tags, which may be removed for cosmetic reasons.  Various excision or surgical techniques may be used depending on your condition, the location of the lesion, and your overall health. Tell a health care provider about: Any allergies you have. All medicines you are taking, including vitamins, herbs, eye drops, creams, and over-the-counter medicines. Any problems you or family members have had with anesthetic medicines. Any blood disorders you have. Any surgeries you  have had. Any medical conditions you have. Whether you are pregnant or may be pregnant. What are the risks? Generally, this is a safe procedure. However, problems may occur, including: Bleeding. Infection. Scarring. Recurrence of the cyst, lipoma, or cancer. Changes in skin sensation or appearance, such as discoloration or swelling. Reaction to the anesthetics. Allergic reaction to surgical materials or ointments. Damage to nerves, blood vessels, muscles, or other structures. Continued pain.  What happens before the procedure? Ask your health care provider about: Changing or stopping your regular medicines. This is especially important if you are taking diabetes medicines or blood thinners. Taking medicines such as aspirin and ibuprofen . These medicines can thin your blood. Do not take these medicines before your procedure if your health care provider instructs you not to. You may be asked to take certain medicines. You may be asked to stop smoking. You may have an exam or testing. What happens during the procedure? To reduce your risk of infection: Your health care team will wash or sanitize their hands. Your skin will be washed with soap. You will be given a medicine to numb the area (local anesthetic). One of the following excision techniques will be performed. At the end of any of these procedures, antibiotic ointment will be applied as needed. Each of the following techniques may vary among health care providers and hospitals. Complete Surgical Excision The area of skin that needs to be removed will be marked with a pen. Using a small scalpel or scissors, the  surgeon will gently cut around and under the lesion until it is completely removed. The lesion will be placed in a fluid and sent to the lab for examination. If necessary, bleeding will be controlled with a device that delivers heat (electrocautery). The edges of the wound may be stitched (sutured) together, and a bandage  (dressing) or surgical glue will be applied. This procedure may be performed to treat a cancerous growth or a noncancerous cyst or lesion.  What happens after the procedure? Return to your normal activities as told by your health care provider. Report any excessive bleeding, spreading redness, or increased pain.

## 2024-03-26 LAB — SURGICAL PATHOLOGY

## 2024-03-26 NOTE — Telephone Encounter (Signed)
 Requested Prescriptions  Pending Prescriptions Disp Refills   omeprazole  (PRILOSEC) 20 MG capsule [Pharmacy Med Name: OMEPRAZOLE  DR 20 MG CAPSULE] 90 capsule 1    Sig: TAKE 1 CAPSULE BY MOUTH EVERY DAY     Gastroenterology: Proton Pump Inhibitors Passed - 03/26/2024 12:54 PM      Passed - Valid encounter within last 12 months    Recent Outpatient Visits           1 week ago Sebaceous cyst   [facility] Quinton Buckler, FNP   2 months ago Yeast dermatitis   [facility] Rockney Cid, DO   3 months ago Night sweats   [facility] Rockney Cid, DO   4 months ago Chronic obstructive pulmonary disease, unspecified COPD type [facility])   [facility] Rockney Cid, DO       Future Appointments             In 1 month Rockney Cid, DO [facility]             albuterol  (VENTOLIN  HFA) 108 (90 Base) MCG/ACT inhaler [Pharmacy Med Name: ALBUTEROL  HFA (VENTOLIN ) INH] 18 each 2    Sig: INHALE 1-2 PUFFS BY MOUTH EVERY 6 HOURS AS NEEDED FOR WHEEZE OR SHORTNESS OF BREATH     Pulmonology:  Beta Agonists 2 Failed - 03/26/2024 12:54 PM      Failed - Last BP in normal range    BP Readings from Last 1 Encounters:  03/25/24 (!) 132/92         Passed - Last Heart Rate in normal range    Pulse Readings from Last 1 Encounters:  03/25/24 90         Passed - Valid encounter within last 12 months    Recent Outpatient Visits           1 week ago Sebaceous cyst   [facility] Quinton Buckler, FNP   2 months ago Yeast dermatitis   [facility] Rockney Cid, DO   3 months ago Night sweats   [facility] Rockney Cid, DO   4 months ago Chronic obstructive pulmonary disease, unspecified COPD type [facility])   [facility] Rockney Cid, DO       Future Appointments             In 1 month Rockney Cid, DO [facility]

## 2024-03-26 NOTE — Progress Notes (Signed)
 DIAGNOSIS Symptomatic soft tissue mass left groin  PROCEDURES 1.  Excision of subcutaneous mass 2 cm left inguinal area 2.  Intermediate closure measuring 3 cms   ANESTHESIA: Lidocaine  1% with epinephrine  total 8cc  EBL: Minimal  FINDINGS: subcutaneous cyst vs lipoma  After informed consent was obtained the patient was prepped and draped in the usual sterile fashion.  Lidocaine  1% was injected over the area of interest.  15 blade knife used to create an incision and the subcutaneous tissue was dissected free with hemostats.  The mass was dissected free from adjacent structures using Metzenbaum scissors.  It was sent for permanent pathology.  Hemostasis obtained with pressure.  The wound was closed in a 2 layer fashion with dermal layer using interrupted 3-0 Vicryl.  The skin was closed in a subcuticular fashion using 4-0 Monocryl.  Dermabond was applied.  No complications. The  patient tolerated procedure well

## 2024-03-28 ENCOUNTER — Encounter: Payer: Self-pay | Admitting: Internal Medicine

## 2024-03-28 ENCOUNTER — Ambulatory Visit (INDEPENDENT_AMBULATORY_CARE_PROVIDER_SITE_OTHER): Admitting: Internal Medicine

## 2024-03-28 ENCOUNTER — Other Ambulatory Visit (HOSPITAL_COMMUNITY)
Admission: RE | Admit: 2024-03-28 | Discharge: 2024-03-28 | Disposition: A | Discharge status: Home / Self Care | Source: Ambulatory Visit | Attending: Internal Medicine | Admitting: Internal Medicine

## 2024-03-28 VITALS — BP 122/76 | HR 88 | Temp 98.0°F | Resp 16 | Ht 62.0 in | Wt 170.0 lb

## 2024-03-28 DIAGNOSIS — N898 Other specified noninflammatory disorders of vagina: Secondary | ICD-10-CM | POA: Insufficient documentation

## 2024-03-28 DIAGNOSIS — R300 Dysuria: Secondary | ICD-10-CM

## 2024-03-28 LAB — POCT URINALYSIS DIPSTICK
Bilirubin, UA: NEGATIVE
Blood, UA: NEGATIVE
Glucose, UA: NEGATIVE
Ketones, UA: NEGATIVE
Leukocytes, UA: NEGATIVE
Nitrite, UA: NEGATIVE
Odor: NORMAL
Protein, UA: NEGATIVE
Spec Grav, UA: 1.02 (ref 1.010–1.025)
Urobilinogen, UA: 0.2 U/dL
pH, UA: 6 (ref 5.0–8.0)

## 2024-03-28 NOTE — Progress Notes (Signed)
   Acute Office Visit  Subjective:     Patient ID: Dawn Kane, female    DOB: 07-21-1971, 53 y.o.   MRN: [id number]  Chief Complaint  Patient presents with   Vaginal Itching   Dysuria    HPI Patient is in today for vaginal discomfort.  Patient states she recently had a cyst removed from her groin and had to have her pubic hair shaved, she is uncertain if her discomfort is coming from hair regrowth or if something else is going on.  She is going on a trip to [place]  in about a month and does not want to have symptoms at that time.  She endorses mild dysuria and increased urinary frequency but denies fevers or flank pain.  She does endorse vaginal itching but is uncertain if it is on the inside or outside.  She denies changes in vaginal discharge.  Review of Systems  Genitourinary:  Positive for dysuria and urgency. Negative for flank pain and hematuria.        Objective:    BP 122/76   Pulse 88   Temp 98 F (36.7 C) (Oral)   Resp 16   Ht 5' 2 (1.575 m)   Wt 170 lb (77.1 kg)   LMP 06/17/2016 Comment: neg preg test  SpO2 96%   BMI 31.09 kg/m    Physical Exam Constitutional:      Appearance: Normal appearance.  HENT:     Head: Normocephalic and atraumatic.   Eyes:     Conjunctiva/sclera: Conjunctivae normal.    Cardiovascular:     Rate and Rhythm: Normal rate and regular rhythm.  Pulmonary:     Effort: Pulmonary effort is normal.     Breath sounds: Normal breath sounds.   Skin:    General: Skin is warm and dry.   Neurological:     General: No focal deficit present.     Mental Status: She is alert. Mental status is at baseline.   Psychiatric:        Mood and Affect: Mood normal.        Behavior: Behavior normal.     No results found for any visits on 03/28/24.      Assessment & Plan:   1. Vaginal itching (Primary)/Dysuria: UA completely clear today, no UTI.  Vaginal swab results pending.  In the meantime recommend using body wash that is fragrance  free and for sensitive skin and wearing breathable cotton undergarments.  - Cervicovaginal ancillary only - POCT urinalysis dipstick   Return if symptoms worsen or fail to improve.  Rockney Cid, DO

## 2024-04-01 ENCOUNTER — Ambulatory Visit: Payer: Self-pay | Admitting: Internal Medicine

## 2024-04-01 DIAGNOSIS — B379 Candidiasis, unspecified: Secondary | ICD-10-CM

## 2024-04-01 LAB — CERVICOVAGINAL ANCILLARY ONLY
Bacterial Vaginitis (gardnerella): NEGATIVE
Candida Glabrata: POSITIVE — AB
Candida Vaginitis: NEGATIVE
Chlamydia: NEGATIVE
Comment: NEGATIVE
Comment: NEGATIVE
Comment: NEGATIVE
Comment: NEGATIVE
Comment: NEGATIVE
Comment: NORMAL
Neisseria Gonorrhea: NEGATIVE
Trichomonas: NEGATIVE

## 2024-04-01 MED ORDER — FLUCONAZOLE 150 MG PO TABS: 150.0000 mg | ORAL_TABLET | Freq: Once | ORAL | 0 refills | Status: AC

## 2024-05-20 ENCOUNTER — Ambulatory Visit: Payer: Medicare HMO | Admitting: Internal Medicine

## 2024-07-02 DIAGNOSIS — Z79899 Other long term (current) drug therapy: Secondary | ICD-10-CM | POA: Diagnosis not present

## 2024-07-18 ENCOUNTER — Other Ambulatory Visit: Payer: Self-pay

## 2024-07-18 ENCOUNTER — Ambulatory Visit (INDEPENDENT_AMBULATORY_CARE_PROVIDER_SITE_OTHER): Admitting: Internal Medicine

## 2024-07-18 ENCOUNTER — Encounter: Payer: Self-pay | Admitting: Internal Medicine

## 2024-07-18 VITALS — BP 130/80 | HR 83 | Temp 98.2°F | Resp 16 | Ht 62.0 in | Wt 168.1 lb

## 2024-07-18 DIAGNOSIS — J029 Acute pharyngitis, unspecified: Secondary | ICD-10-CM | POA: Diagnosis not present

## 2024-07-18 DIAGNOSIS — R051 Acute cough: Secondary | ICD-10-CM | POA: Diagnosis not present

## 2024-07-18 LAB — POC COVID19/FLU A&B COMBO
Covid Antigen, POC: NEGATIVE
Influenza A Antigen, POC: NEGATIVE
Influenza B Antigen, POC: NEGATIVE

## 2024-07-18 LAB — POCT RAPID STREP A (OFFICE): Rapid Strep A Screen: NEGATIVE

## 2024-07-18 MED ORDER — METHYLPREDNISOLONE 4 MG PO TBPK: ORAL_TABLET | ORAL | 0 refills | Status: AC

## 2024-07-18 NOTE — Progress Notes (Signed)
 Acute Office Visit  Subjective:     Patient ID: Dawn Kane, female    DOB: 1970-12-13, 52 y.o.   MRN: [id number]  Chief Complaint  Patient presents with   Sore Throat    Fatigue, cough    HPI Patient is in today for sore throat.   Discussed the use of AI scribe software for clinical note transcription with the patient, who gave verbal consent to proceed.  History of Present Illness Dawn Kane is a 53 year old female with a history of recurrent strep throat who presents with sore throat and respiratory symptoms.  She has experienced a sore throat since Monday, described as feeling 'like sand is in there' and 'real tight,' making swallowing difficult. The throat pain is severe and has significantly impacted her energy levels. She has a history of recurrent strep throat, occurring once or twice a year since childhood.  In addition to the sore throat, she has respiratory symptoms, including a productive cough with mucus, shortness of breath, and occasional wheezing. She denies fever but experiences chills and sometimes feels hot, with normal or low home thermometer readings.  She experiences dizziness every morning since a past head injury, with worsening noted on Sunday and Monday. Sinus issues, attributed to her cats, are alleviated by over-the-counter sinus medication. She uses a chest congestion medication that helps quiet her cough and facilitate mucus expectoration.    Review of Systems  Constitutional:  Positive for chills and malaise/fatigue. Negative for fever.  HENT:  Positive for sore throat. Negative for congestion, ear pain and sinus pain.   Respiratory:  Positive for cough. Negative for shortness of breath and wheezing.         Objective:    BP 130/80 (Cuff Size: Large)   Pulse 83   Temp 98.2 F (36.8 C) (Oral)   Resp 16   Ht 5' 2 (1.575 m)   Wt 168 lb 1.6 oz (76.2 kg)   LMP 06/17/2016 Comment: neg preg test  SpO2 98%   BMI 30.75 kg/m  BP  Readings from Last 3 Encounters:  07/18/24 130/80  03/28/24 122/76  03/25/24 (!) 132/92   Wt Readings from Last 3 Encounters:  07/18/24 168 lb 1.6 oz (76.2 kg)  03/28/24 170 lb (77.1 kg)  03/25/24 170 lb 3.2 oz (77.2 kg)      Physical Exam Constitutional:      Appearance: Normal appearance.  HENT:     Head: Normocephalic and atraumatic.     Right Ear: Tympanic membrane, ear canal and external ear normal.     Left Ear: Tympanic membrane, ear canal and external ear normal.     Nose: Nose normal.     Mouth/Throat:     Mouth: Mucous membranes are moist.     Pharynx: Oropharynx is clear.  Eyes:     Conjunctiva/sclera: Conjunctivae normal.  Cardiovascular:     Rate and Rhythm: Normal rate and regular rhythm.  Pulmonary:     Effort: Pulmonary effort is normal.     Breath sounds: Normal breath sounds. No wheezing, rhonchi or rales.  Skin:    General: Skin is warm and dry.  Neurological:     General: No focal deficit present.     Mental Status: She is alert. Mental status is at baseline.  Psychiatric:        Mood and Affect: Mood normal.        Behavior: Behavior normal.     Results for orders placed or  performed in visit on 07/18/24  POC Covid19/Flu A&B Antigen  Result Value Ref Range   Influenza A Antigen, POC Negative Negative   Influenza B Antigen, POC Negative Negative   Covid Antigen, POC Negative Negative  POCT rapid strep A  Result Value Ref Range   Rapid Strep A Screen Negative Negative        Assessment & Plan:   Assessment & Plan Acute pharyngitis Acute pharyngitis with odynophagia, throat tightness, and foreign body sensation. Negative rapid strep test; throat culture ordered. Viral etiology likely. Discussed self-limiting nature and risks of unnecessary antibiotics. - Order throat culture. - Prescribe Medrol DosePak for inflammation. - Advise continuation of over-the-counter sinus medication. - Provide work note for absence due to  illness.  Cough Cough with lung congestion sensation, improving. Likely due to sinus drainage and viral illness. Discussed over-the-counter medication use. - Advise use of inhaler if dyspnea or wheezing occurs. - Recommend over-the-counter Mucinex for chest congestion. - Offer cough suppressant for nighttime use if needed.  - POC Covid19/Flu A&B Antigen - POCT rapid strep A - methylPREDNISolone (MEDROL DOSEPAK) 4 MG TBPK tablet; Use as directed.  Dispense: 21 each; Refill: 0 - Culture, Group A Strep   Return if symptoms worsen or fail to improve.  Sharyle Fischer, DO

## 2024-07-20 LAB — CULTURE, GROUP A STREP
Micro Number: 17051320
SPECIMEN QUALITY:: ADEQUATE

## 2024-07-22 ENCOUNTER — Ambulatory Visit: Payer: Self-pay

## 2024-07-22 ENCOUNTER — Ambulatory Visit: Payer: Self-pay | Admitting: Internal Medicine

## 2024-07-22 NOTE — Telephone Encounter (Signed)
 Patient calling to request Penicillin for her continued sore throat. Reports she goes through this a couple of times a year and Penicillin is the one thing that works for her. Patient states she is having a lot of pain with swallowing due to how inflamed her throat is.  Asking for a follow up phone call.  FYI Only or Action Required?: Action required by provider: clinical question for provider.  Patient was last seen in primary care on 07/18/2024 by Bernardo Fend, DO.  Called Nurse Triage reporting Sore Throat.  Symptoms began several days ago.  Interventions attempted: Rest, hydration, or home remedies.  Symptoms are: unchanged.  Triage Disposition: See PCP When Office is Open (Within 3 Days)  Patient/caregiver understands and will follow disposition?: No, wishes to speak with PCP  Copied from CRM #[id number]. Topic: Clinical - Prescription Issue >> Jul 22, 2024 10:15 AM Darshell M wrote: Reason for CRM: Patient requesting penicillin to remedy sore throat. Patient says she goes through this a couple times a year. Penicillin is the only thing that helps when this happens. Cultures were negative for strep but patient throat still sore. Provider does not want patient to build up resistance to antibiotics. BC# [id number] Reason for Disposition  [1] Sore throat is the only symptom AND [2] present > 48 hours  Answer Assessment - Initial Assessment Questions 1. ONSET: When did the throat start hurting? (Hours or days ago)      Started last week 2. SEVERITY: How bad is the sore throat? (Scale 1-10; mild, moderate or severe)     7-8 out of 10 3. STREP EXPOSURE: Has there been any exposure to strep within the past week? If Yes, ask: What type of contact occurred?      Not that patient knows of  4.  VIRAL SYMPTOMS: Are there any symptoms of a cold, such as a runny nose, cough, hoarse voice or red eyes?      Cough, runny nose 5. FEVER: Do you have a fever? If Yes, ask: What is  your temperature, how was it measured, and when did it start?     no 6. PUS ON THE TONSILS: Is there pus on the tonsils in the back of your throat?     no 7. OTHER SYMPTOMS: Do you have any other symptoms? (e.g., difficulty breathing, headache, rash)     Pain with swallowing  Protocols used: Sore Throat-A-AH

## 2024-07-23 ENCOUNTER — Other Ambulatory Visit: Payer: Self-pay | Admitting: Internal Medicine

## 2024-07-23 NOTE — Telephone Encounter (Signed)
 Pt called E2C2 returning call. She states that she has had this throat swelling soreness multiple times in her life and penicillin is the only thing that works. She states she is allergic to amoxicillin as it gives her GI upset but she has never had an issue with penicillin before. Not in any distress currently. Pt states sore throat is worse in the am.

## 2024-07-23 NOTE — Telephone Encounter (Signed)
 Copied from CRM [id number]. Topic: Clinical - Red Word Triage >> Jul 23, 2024  3:11 PM Larissa S wrote: [facility] that prompted transfer to Nurse Triage: throat -swelling.

## 2024-07-23 NOTE — Telephone Encounter (Signed)
 Left message for patient

## 2024-07-23 NOTE — Telephone Encounter (Signed)
 This encounter was created in error - please disregard.

## 2024-07-24 NOTE — Telephone Encounter (Signed)
 Left message for patient that penicillin and amoxicillin are in same class drug and she is allergic to this per chart. Please call office if she continue to have symptoms

## 2024-07-30 ENCOUNTER — Ambulatory Visit: Payer: Self-pay

## 2024-07-30 ENCOUNTER — Other Ambulatory Visit: Payer: Self-pay | Admitting: Internal Medicine

## 2024-07-30 DIAGNOSIS — J029 Acute pharyngitis, unspecified: Secondary | ICD-10-CM

## 2024-07-30 MED ORDER — AMOXICILLIN 500 MG PO TABS: 500.0000 mg | ORAL_TABLET | Freq: Two times a day (BID) | ORAL | 0 refills | Status: AC

## 2024-07-30 NOTE — Telephone Encounter (Signed)
 Documentation in patients chart showing PCN abx sent by provider today at 3:51 pm and patient was notified at 4:08 pm.  Copied from CRM #[id number]. Topic: Clinical - Prescription Issue >> Jul 30, 2024  3:55 PM Willma R wrote: Reason for CRM: Patient is requesting to have Amoxicillin removed from her allergies on MyChart. States it makes he vomit. She is trying to be prescribed Penicillin for her swollen throat in which she spoke to NT earlier.  Contacted CAL because patient was prescribe Amoxicillin today. CAL advised just to send a message it stating what the patient wanted.

## 2024-07-30 NOTE — Telephone Encounter (Signed)
 Patient stated she need penicillin , the amoxcillin will make her vomit Copied from CRM #[id number]. Topic: Clinical - Prescription Issue >> Jul 30, 2024  3:57 PM Pinkey ORN wrote: Reason for CRM: Prescription Issue >> Jul 30, 2024  3:58 PM Pinkey ORN wrote: Patient called in states she was sent over a prescription to the pharmacy for amoxicillin (AMOXIL) 500 MG tablet when it's supposed to be Penicillin. Please follow up with the patient.

## 2024-07-30 NOTE — Telephone Encounter (Signed)
 FYI Only or Action Required?: Action required by provider: update on patient condition and Please call patient back regarding penicillin. Pharmacy confirmed  Patient was last seen in primary care on 07/18/2024 by Bernardo Fend, DO.  Called Nurse Triage reporting Sore Throat.  Symptoms began several weeks ago.  Interventions attempted: OTC medications:  , Prescription medications: steroid pack, and Rest, hydration, or home remedies.  Symptoms are: gradually worsening.  Triage Disposition: No disposition on file.  Patient/caregiver understands and will follow disposition?:   Copied from CRM [id number]. Topic: Clinical - Red Word Triage >> Jul 30, 2024 10:23 AM Avram MATSU wrote: Red Word that prompted transfer to Nurse Triage: throat is still swollen and pain pt is asking for penicillin Reason for Disposition  SEVERE throat pain (e.g., excruciating)  Answer Assessment - Initial Assessment Questions Previous appt for Sore throat 10/2- has not resolved, worsened- steroid pack given improved slightly but as soon as she finished she got worse again. Hard time swallowing makes her choke of saliva/sputum intermittently, especially in the AM .  Took amoxicillin off her allergy list- ONLY UPSETS HER STOMACH  Pencillin is the only thing that helps- NO REACTION States she is miserable and that she has to fight her doctors every year to get penicillin and it is the only thing that has ever helped with her throat. Advised HP message would be sent and CAL notified. Pt aware of UC/ED precautions and understands.   1. ONSET: When did the throat start hurting? (Hours or days ago)      2 weeks+ 2. SEVERITY: How bad is the sore throat? (Scale 1-10; mild, moderate or severe)     8-10/10 3. STREP EXPOSURE: Has there been any exposure to strep within the past week? If Yes, ask: What type of contact occurred?      Unknown- works in [employer] 4.  VIRAL SYMPTOMS: Are there any symptoms of a cold,  such as a runny nose, cough, hoarse voice or red eyes?      cough 5. FEVER: Do you have a fever? If Yes, ask: What is your temperature, how was it measured, and when did it start?     Feels like she is spiking fevers every so often  6. PUS ON THE TONSILS: Is there pus on the tonsils in the back of your throat?     unsure 7. OTHER SYMPTOMS: Do you have any other symptoms? (e.g., difficulty breathing, headache, rash)      Hard time swallowing sometimes, ends up choking on her sputum especially in the morning.  Protocols used: Sore Throat-A-AH

## 2024-07-30 NOTE — Telephone Encounter (Signed)
 Patient notified

## 2024-07-30 NOTE — Telephone Encounter (Signed)
 Would like a PCN antibiotic called in. That what normally helps help

## 2024-07-30 NOTE — Telephone Encounter (Signed)
 Called patient, still have a sore throat, swollen, feels like she choking in the morning. Patient stated she took Amoxicillin off mychart. It only gave her a upset stomach when she took it before.

## 2024-07-31 ENCOUNTER — Telehealth: Payer: Self-pay

## 2024-07-31 NOTE — Telephone Encounter (Signed)
 Copied from CRM #[id number]. Topic: Clinical - Prescription Issue >> Jul 31, 2024  9:22 AM Travis F wrote: Reason for CRM: Patient is calling in because she needs her prescription for amoxicillin (AMOXIL) 500 MG tablet [[id number]] to be changed to penicillin because she cannot take amoxicillin. Patient is requesting it be done as soon as possible because her throat her swollen.

## 2024-07-31 NOTE — Telephone Encounter (Signed)
 Patient notified and stated she will find her another dr

## 2024-07-31 NOTE — Telephone Encounter (Signed)
 Patient notified. She stated she will find her another dr

## 2024-08-07 DIAGNOSIS — J351 Hypertrophy of tonsils: Secondary | ICD-10-CM | POA: Diagnosis not present

## 2024-08-13 DIAGNOSIS — F17210 Nicotine dependence, cigarettes, uncomplicated: Secondary | ICD-10-CM | POA: Diagnosis not present

## 2024-08-13 DIAGNOSIS — E7800 Pure hypercholesterolemia, unspecified: Secondary | ICD-10-CM | POA: Diagnosis not present

## 2024-08-13 DIAGNOSIS — R7309 Other abnormal glucose: Secondary | ICD-10-CM | POA: Diagnosis not present

## 2024-08-13 DIAGNOSIS — R300 Dysuria: Secondary | ICD-10-CM | POA: Diagnosis not present

## 2024-08-13 DIAGNOSIS — E039 Hypothyroidism, unspecified: Secondary | ICD-10-CM | POA: Diagnosis not present

## 2024-08-13 DIAGNOSIS — Z0000 Encounter for general adult medical examination without abnormal findings: Secondary | ICD-10-CM | POA: Diagnosis not present

## 2024-08-13 DIAGNOSIS — J3081 Allergic rhinitis due to animal (cat) (dog) hair and dander: Secondary | ICD-10-CM | POA: Diagnosis not present

## 2024-08-13 DIAGNOSIS — K219 Gastro-esophageal reflux disease without esophagitis: Secondary | ICD-10-CM | POA: Diagnosis not present

## 2024-08-13 DIAGNOSIS — B182 Chronic viral hepatitis C: Secondary | ICD-10-CM | POA: Diagnosis not present

## 2024-08-13 DIAGNOSIS — Z1331 Encounter for screening for depression: Secondary | ICD-10-CM | POA: Diagnosis not present

## 2024-08-13 DIAGNOSIS — J449 Chronic obstructive pulmonary disease, unspecified: Secondary | ICD-10-CM | POA: Diagnosis not present

## 2024-08-13 DIAGNOSIS — F419 Anxiety disorder, unspecified: Secondary | ICD-10-CM | POA: Diagnosis not present

## 2024-08-13 DIAGNOSIS — Z7689 Persons encountering health services in other specified circumstances: Secondary | ICD-10-CM | POA: Diagnosis not present

## 2024-08-19 ENCOUNTER — Encounter: Payer: Self-pay | Admitting: Radiology

## 2024-08-21 ENCOUNTER — Telehealth: Payer: Self-pay

## 2024-08-21 ENCOUNTER — Encounter (INDEPENDENT_AMBULATORY_CARE_PROVIDER_SITE_OTHER): Payer: Self-pay

## 2024-08-21 NOTE — Telephone Encounter (Signed)
 WADDELL HASTINGS M called about Clinical Support - Medical Question.  Additional details:Hello BENETTA MACLAREN,  It was a pleasure speaking with you today! As discussed, our Gastroenterology department requires most recent medical records and labs (must be within the past year) prior to scheduling a new patient appointment.      Please reply directly to this message and you may attach one of the following: (Medical Records are Mandatory per Office Policy)    1. Most recent medical records and/or relevant labs (must be within the past year).   2. Provider order/ referral which should have a diagnosis and ICD-10 diagnosis code.  3. Insurance referral - ONLY IF YOUR INSURANCE REQUIRES A REFERRAL.    Once you have replied with your documents, you may give us  a call and anyone in our Gastroenterology department can assist you. Please let the agent know you have attached your records via MyChart.     If you are faxing records, please allow 72 business hours for your documents to be scanned into your chart.     Thank you for choosing [facility] and have a great day!  [facility]   Ph: [phone])

## 2024-08-26 ENCOUNTER — Other Ambulatory Visit: Payer: Self-pay | Admitting: Internal Medicine

## 2024-08-26 DIAGNOSIS — E039 Hypothyroidism, unspecified: Secondary | ICD-10-CM

## 2024-08-28 NOTE — Telephone Encounter (Signed)
 Requested Prescriptions  Pending Prescriptions Disp Refills   levothyroxine  (SYNTHROID ) 112 MCG tablet [Pharmacy Med Name: LEVOTHYROXINE  112 MCG TABLET] 90 tablet 1    Sig: TAKE 1 TABLET BY MOUTH EVERY DAY     Endocrinology:  Hypothyroid Agents Failed - 08/28/2024 12:46 PM      Failed - TSH in normal range and within 360 days    TSH  Date Value Ref Range Status  11/21/2023 4.59 (H) mIU/L Final    Comment:              Reference Range .           > or = 20 Years  0.40-4.50 .                Pregnancy Ranges           First trimester    0.26-2.66           Second trimester   0.55-2.73           Third trimester    0.43-2.91          Passed - Valid encounter within last 12 months    Recent Outpatient Visits           1 month ago Sore throat   [facility] Bernardo Fend, DO   5 months ago Vaginal itching   [facility] Bernardo Fend, DO   5 months ago Sebaceous cyst   [facility] Gareth Mliss FAL[place], FNP   8 months ago Yeast dermatitis   [facility] Bernardo Fend, DO   8 months ago Night sweats   [facility] Bernardo Fend, [place]

## 2024-08-30 ENCOUNTER — Ambulatory Visit
Admission: RE | Admit: 2024-08-30 | Discharge: 2024-08-30 | Disposition: A | Discharge status: Home / Self Care | Source: Ambulatory Visit | Attending: Internal Medicine | Admitting: Internal Medicine

## 2024-08-30 DIAGNOSIS — Z122 Encounter for screening for malignant neoplasm of respiratory organs: Secondary | ICD-10-CM | POA: Diagnosis not present

## 2024-08-30 DIAGNOSIS — Z87891 Personal history of nicotine dependence: Secondary | ICD-10-CM | POA: Insufficient documentation

## 2024-08-30 DIAGNOSIS — F17210 Nicotine dependence, cigarettes, uncomplicated: Secondary | ICD-10-CM | POA: Insufficient documentation

## 2024-09-02 ENCOUNTER — Telehealth: Payer: Self-pay

## 2024-09-02 ENCOUNTER — Encounter (INDEPENDENT_AMBULATORY_CARE_PROVIDER_SITE_OTHER): Payer: Self-pay

## 2024-09-02 NOTE — Telephone Encounter (Signed)
 Copied from CRM [id number]. Topic: Appointment Scheduling - Schedule Appointment  >> Sep 02, 2024  9:58 AM Jon BRAVO wrote:  Adrienne Harmon called about Appointment Scheduling - Schedule Appointment.  Additional details:  Patient has been referred by Primary Care Provider for a Screening Colonoscopy. ([facility] SCREENING PROCEDURE POOL) Patient is interested in having Screening Colonoscopy with [facility].   Please call patient to get the process started. Patient was made aware that Procedure Team will get back to them as soon as they can but the turnaround time is roughly 1 business week for the interview and scheduling.Scheduling can be longer if Prior Authorizations or Cardiac Clearances are required.    Coverage on file      Agent confirmed this patient is having no other symptoms at the time of this call. (For other issues like GERD, Abdominal Pain, Anemia etc. a consult is required)      Please send MyChart Pre-Op questionnaire if applicable to this chart.  Once answered the patient can expect a call within 2 days

## 2024-09-09 ENCOUNTER — Other Ambulatory Visit: Payer: Self-pay

## 2024-09-09 DIAGNOSIS — F17210 Nicotine dependence, cigarettes, uncomplicated: Secondary | ICD-10-CM

## 2024-09-09 DIAGNOSIS — Z87891 Personal history of nicotine dependence: Secondary | ICD-10-CM

## 2024-09-09 DIAGNOSIS — Z122 Encounter for screening for malignant neoplasm of respiratory organs: Secondary | ICD-10-CM

## 2024-09-20 ENCOUNTER — Ambulatory Visit (INDEPENDENT_AMBULATORY_CARE_PROVIDER_SITE_OTHER)

## 2024-09-20 ENCOUNTER — Ambulatory Visit (INDEPENDENT_AMBULATORY_CARE_PROVIDER_SITE_OTHER): Admitting: Orthopaedic Surgery

## 2024-09-20 VITALS — BP 119/82 | HR 67 | Ht 66.5 in | Wt 180.0 lb

## 2024-09-20 DIAGNOSIS — S63591A Other specified sprain of right wrist, initial encounter: Secondary | ICD-10-CM

## 2024-09-20 NOTE — Progress Notes (Unsigned)
 [facility]  New Patient Visit       Orthopaedic {CSOVISITTYPE:65251} Visit    Subjective:     History of Present Illness      ROS      BP 119/82 (Patient Position: Sitting, Cuff Size: Large)   Pulse 67   Ht 1.689 m (5' 6.5)   Wt 81.6 kg (180 lb)   SpO2 97%   BMI 28.62 kg/m     Current Medications[1]  .  Patient has no known allergies.  Medical History[2]   Past Surgical History[3]  Family History[4]  Social History     Tobacco Use    Smoking status: Never    Smokeless tobacco: Not on file   Substance Use Topics    Alcohol use: Yes     Alcohol/week: 7.0 standard drinks of alcohol     Types: 7 Glasses of wine per week       Objective:     Patient is a 53 y.o. female, she is awake, alert, oriented, and is in no acute distress. {CSOaccompaniedvsambulating:65860}  Physical Exam        Diagnostic Tests/Imaging:     {CSOdiagnostics:65859}  Results      No results found.    Assessment/Plan:     No diagnosis found.    Assessment & Plan        Procedures/Casting/Splinting/DME:     ***       [1]   Current Outpatient Medications   Medication Sig Dispense Refill    ondansetron  (ZOFRAN  ODT) 4 MG disintegrating tablet Take 1 tablet (4 mg total) by mouth every 8 (eight) hours as needed for Nausea. (Patient not taking: Reported on 09/20/2024) 20 tablet 0     No current facility-administered medications for this visit.   [2] No past medical history on file.  [3]   Past Surgical History:  Procedure Laterality Date    CESAREAN SECTION     [4] No family history on file.

## 2024-10-07 ENCOUNTER — Other Ambulatory Visit: Payer: Self-pay | Admitting: Family

## 2024-10-21 ENCOUNTER — Encounter (INDEPENDENT_AMBULATORY_CARE_PROVIDER_SITE_OTHER): Payer: Self-pay

## 2024-10-21 ENCOUNTER — Telehealth (INDEPENDENT_AMBULATORY_CARE_PROVIDER_SITE_OTHER): Payer: Self-pay

## 2024-10-21 NOTE — Telephone Encounter (Signed)
 Spoke with pt to schedule colonoscopy procedure. Offered multiple dates.  [facility] 10/21/2024

## 2024-10-21 NOTE — Progress Notes (Signed)
 HST case submitted for ds cln 12/10/2024

## 2024-10-21 NOTE — Progress Notes (Signed)
 NPR DS CLN 12/10/2024

## 2024-10-21 NOTE — Telephone Encounter (Signed)
 DS CLN 12/10/2024

## 2024-10-22 ENCOUNTER — Other Ambulatory Visit: Payer: Self-pay | Admitting: Gastroenterology

## 2024-10-22 MED ORDER — GOLYTELY 236 G PO SOLR: ORAL | 0 refills | Status: AC

## 2024-10-24 MED ORDER — CALCIUM CHLORIDE 10 % IV SOLN
INTRAVENOUS | Status: AC
  Filled 2024-10-24: qty 10

## 2024-11-22 ENCOUNTER — Ambulatory Visit (INDEPENDENT_AMBULATORY_CARE_PROVIDER_SITE_OTHER): Admitting: Orthopaedic Surgery

## 2024-11-22 ENCOUNTER — Encounter: Payer: Self-pay | Admitting: Emergency Medicine

## 2024-11-22 ENCOUNTER — Other Ambulatory Visit: Payer: Self-pay

## 2024-11-22 ENCOUNTER — Emergency Department
Admission: EM | Admit: 2024-11-22 | Discharge: 2024-11-22 | Disposition: A | Discharge status: Home / Self Care | Source: Home / Self Care | Attending: Emergency Medicine | Admitting: Emergency Medicine

## 2024-11-22 ENCOUNTER — Emergency Department

## 2024-11-22 DIAGNOSIS — G44319 Acute post-traumatic headache, not intractable: Secondary | ICD-10-CM

## 2024-11-22 DIAGNOSIS — S161XXA Strain of muscle, fascia and tendon at neck level, initial encounter: Secondary | ICD-10-CM

## 2024-11-22 MED ORDER — DIAZEPAM 2 MG PO TABS: 2.0000 mg | ORAL_TABLET | Freq: Three times a day (TID) | ORAL | 0 refills | Status: AC | PRN

## 2024-11-22 NOTE — Discharge Instructions (Addendum)
 Your exam and CT scans are normal and reassuring vital signs and was here today related to your fall.  Take the prescription muscle relaxant along with the anti-inflammatory as directed.  Follow-up with your primary provider or company approved provider for your work-related injury.

## 2024-11-22 NOTE — ED Triage Notes (Signed)
 PT sent from [facility] for evaluation of neck and back of head pain. Pt states pain in back of neck radiates up head and hurts with any movement. Pt had a work related injury on 1/22 and pain has not improved.

## 2024-11-22 NOTE — ED Provider Notes (Signed)
 "   [facility] Emergency Department Provider Note     Event Date/Time   First MD Initiated Contact with Patient 11/22/24 1904     (approximate)   History   Headache   HPI  ANKITA NEWCOMER is a 54 y.o. female with a history of HTN, HLD, GERD, anxiety, hepatitis C, who presents to the ED from [facility].  She presented there for evaluation of ongoing neck and back of the head pain following a mechanical fall.  Patient reports she had work-related trip and fall back on January 22, and reports she has not had improvement of her symptoms patient denies any syncopal episodes, nausea, vomiting, or vision changes.  She is [place] been seen by her PCP, started on a steroid Dosepak as well as prescriptions for muscle relaxant.  Patient was referred from [facility] for evaluation of ongoing symptoms.   Physical Exam   Triage Vital Signs: ED Triage Vitals  Encounter Vitals Group     BP 11/22/24 1713 (!) 186/97     Girls Systolic BP Percentile --      Girls Diastolic BP Percentile --      Boys Systolic BP Percentile --      Boys Diastolic BP Percentile --      Pulse Rate 11/22/24 1710 80     Resp 11/22/24 1710 18     Temp 11/22/24 1710 98.4 F (36.9 C)     Temp src --      SpO2 11/22/24 1710 98 %     Weight 11/22/24 1710 169 lb 15.6 oz (77.1 kg)     Height --      Head Circumference --      Peak Flow --      Pain Score 11/22/24 1710 7     Pain Loc --      Pain Education --      Exclude from Growth Chart --     Most recent vital signs: Vitals:   11/22/24 1710 11/22/24 1713  BP:  (!) 186/97  Pulse: 80   Resp: 18   Temp: 98.4 F (36.9 C)   SpO2: 98%     General Awake, no distress. NAD A&O x 4 HEENT NCAT. PERRL. EOMI. No rhinorrhea. Mucous membranes are moist.  CV:  Good peripheral perfusion. RRR RESP:  Normal effort. CTA ABD:  No distention.  MSK:  Normal spinal alignment without midline tenderness, spasm, fomites, or step-off.  Posterior cervical occipital  musculature.  No crepitus on exam.  Normal of all extremities. NEURO: Cranial nerves II to XII grossly intact.  Normal UE/LE DTRs bilaterally.  Normal gait without ataxia    ED Results / Procedures / Treatments   Labs (all labs ordered are listed, but only abnormal results are displayed) Labs Reviewed - No data to display   EKG   RADIOLOGY  I personally viewed and evaluated these images as part of my medical decision making, as well as reviewing the written report by the radiologist.  ED Provider Interpretation: No acute findings  CT Cervical Spine Wo Contrast Result Date: 11/22/2024 EXAM: CT CERVICAL SPINE WITHOUT CONTRAST 11/22/2024 05:50:13 PM TECHNIQUE: CT of the cervical spine was performed without the administration of intravenous contrast. Multiplanar reformatted images are provided for review. Automated exposure control, iterative reconstruction, and/or weight based adjustment of the mA/kV was utilized to reduce the radiation dose to as low as reasonably achievable. COMPARISON: None available. CLINICAL HISTORY: Neck trauma, dangerous injury mechanism (Age 94-64y) FINDINGS: BONES  AND ALIGNMENT: No acute fracture or traumatic malalignment. DEGENERATIVE CHANGES: No significant degenerative changes. SOFT TISSUES: No prevertebral soft tissue swelling. IMPRESSION: 1. No significant abnormality Electronically signed by: Pinkie Pebbles MD 11/22/2024 05:55 PM EST RP Workstation: [id number]   CT Head Wo Contrast Result Date: 11/22/2024 EXAM: CT HEAD WITHOUT CONTRAST 11/22/2024 05:50:13 PM TECHNIQUE: CT of the head was performed without the administration of intravenous contrast. Automated exposure control, iterative reconstruction, and/or weight based adjustment of the mA/kV was utilized to reduce the radiation dose to as low as reasonably achievable. COMPARISON: 10/13/2020. CLINICAL HISTORY: Post-traumatic headache. FINDINGS: BRAIN AND VENTRICLES: No acute hemorrhage. No evidence of acute  infarct. No hydrocephalus. No extra-axial collection. No mass effect or midline shift. SINUSES: No acute abnormality. SOFT TISSUES AND SKULL: No acute soft tissue abnormality. No skull fracture. IMPRESSION: 1. No acute intracranial abnormality. Electronically signed by: Pinkie Pebbles MD 11/22/2024 05:54 PM EST RP Workstation: [id number]     PROCEDURES:  Critical Care performed: No  Procedures   MEDICATIONS ORDERED IN ED: Medications - No data to display   IMPRESSION / MDM / ASSESSMENT AND PLAN / ED COURSE  I reviewed the triage vital signs and the nursing notes.                              Differential diagnosis includes, but is not limited to, SDH, closed head injury, cervical radiculopathy, postconcussion syndrome, posttraumatic headache  Patient's presentation is most consistent with acute complicated illness / injury requiring diagnostic workup.  Patient's diagnosis is consistent with acute neck strain and likely tension type headache following mechanical fall.  No acute neuro muscle deficits are appreciated.  Patient's exam is reassuring and CT images turbid by me, showed no acute process.  Symptoms likely represent myalgias and likely posttraumatic tension type headache.  Patient will be discharged home with prescriptions for Valium  (#9). Patient is to follow up with her primary provider or Worker's Comp. approved provider as discussed, as needed or otherwise directed. Patient is given ED precautions to return to the ED for any worsening or new symptoms.   FINAL CLINICAL IMPRESSION(S) / ED DIAGNOSES   Final diagnoses:  Acute post-traumatic headache, not intractable  Acute strain of neck muscle, initial encounter     Rx / DC Orders   ED Discharge Orders     None        Note:  This document was prepared using Dragon voice recognition software and may include unintentional dictation errors.    Loyd Candida LULLA Aldona, PA-C 11/22/24 2327    Dorothyann Drivers, MD 11/22/24 2334  "

## 2024-11-22 NOTE — ED Triage Notes (Signed)
 First nurse note: pt to ED [facility] for head and back injury on 1/22. Reports h/a since. Seen by PCP twice since injury. Has been taking narcotics for pain. Denies LOC with injury.

## 2024-12-10 ENCOUNTER — Encounter (INDEPENDENT_AMBULATORY_CARE_PROVIDER_SITE_OTHER): Admitting: Gastroenterology
